# Patient Record
Sex: Female | Born: 1980 | Race: White | Hispanic: No | Marital: Married | Smoking: Never smoker
Health system: Southern US, Community
[De-identification: ages and names within clinical notes are randomized; demographics above are authoritative.]

## PROBLEM LIST (undated history)

## (undated) DIAGNOSIS — N390 Urinary tract infection, site not specified: Secondary | ICD-10-CM

## (undated) DIAGNOSIS — K589 Irritable bowel syndrome without diarrhea: Secondary | ICD-10-CM

## (undated) DIAGNOSIS — F419 Anxiety disorder, unspecified: Secondary | ICD-10-CM

## (undated) DIAGNOSIS — O039 Complete or unspecified spontaneous abortion without complication: Secondary | ICD-10-CM

## (undated) DIAGNOSIS — N200 Calculus of kidney: Secondary | ICD-10-CM

## (undated) DIAGNOSIS — F329 Major depressive disorder, single episode, unspecified: Secondary | ICD-10-CM

## (undated) DIAGNOSIS — U071 COVID-19: Secondary | ICD-10-CM

## (undated) DIAGNOSIS — F32A Depression, unspecified: Secondary | ICD-10-CM

## (undated) DIAGNOSIS — L409 Psoriasis, unspecified: Secondary | ICD-10-CM

## (undated) DIAGNOSIS — L811 Chloasma: Secondary | ICD-10-CM

## (undated) HISTORY — DX: Major depressive disorder, single episode, unspecified: F32.9

## (undated) HISTORY — DX: Irritable bowel syndrome, unspecified: K58.9

## (undated) HISTORY — PX: CERVICAL BIOPSY: SHX590

## (undated) HISTORY — DX: COVID-19: U07.1

## (undated) HISTORY — DX: Urinary tract infection, site not specified: N39.0

## (undated) HISTORY — DX: Complete or unspecified spontaneous abortion without complication: O03.9

## (undated) HISTORY — DX: Psoriasis, unspecified: L40.9

## (undated) HISTORY — DX: Anxiety disorder, unspecified: F41.9

## (undated) HISTORY — DX: Chloasma: L81.1

## (undated) HISTORY — PX: KNEE SURGERY: SHX244

## (undated) HISTORY — DX: Calculus of kidney: N20.0

## (undated) HISTORY — DX: Depression, unspecified: F32.A

---

## 2004-07-01 ENCOUNTER — Emergency Department: Payer: Self-pay | Admitting: Internal Medicine

## 2011-03-24 ENCOUNTER — Ambulatory Visit (INDEPENDENT_AMBULATORY_CARE_PROVIDER_SITE_OTHER): Payer: Self-pay | Admitting: Internal Medicine

## 2011-03-24 ENCOUNTER — Encounter: Payer: Self-pay | Admitting: Internal Medicine

## 2011-03-24 VITALS — BP 118/70 | HR 93 | Temp 98.7°F | Resp 20 | Ht 63.0 in | Wt 164.0 lb

## 2011-03-24 DIAGNOSIS — F419 Anxiety disorder, unspecified: Secondary | ICD-10-CM

## 2011-03-24 DIAGNOSIS — F411 Generalized anxiety disorder: Secondary | ICD-10-CM

## 2011-03-24 DIAGNOSIS — L409 Psoriasis, unspecified: Secondary | ICD-10-CM

## 2011-03-24 DIAGNOSIS — Z0000 Encounter for general adult medical examination without abnormal findings: Secondary | ICD-10-CM

## 2011-03-24 DIAGNOSIS — L408 Other psoriasis: Secondary | ICD-10-CM

## 2011-03-24 DIAGNOSIS — E663 Overweight: Secondary | ICD-10-CM

## 2011-03-24 MED ORDER — CITALOPRAM HYDROBROMIDE 10 MG PO TABS: 10.0000 mg | ORAL_TABLET | Freq: Every day | ORAL | Status: DC

## 2011-03-24 MED ORDER — PHENTERMINE HCL 37.5 MG PO TABS: 37.5000 mg | ORAL_TABLET | Freq: Every day | ORAL | Status: DC

## 2011-03-24 MED ORDER — PRENATAL RX 60-1 MG PO TABS: 1.0000 | ORAL_TABLET | Freq: Every day | ORAL | Status: AC

## 2011-03-24 MED ORDER — BETAMETHASONE VALERATE 0.12 % EX FOAM: 1.0000 g | Freq: Two times a day (BID) | CUTANEOUS | Status: AC

## 2011-03-24 NOTE — Patient Instructions (Signed)
Websites to look into.  [email or web address], [email or web address]

## 2011-03-25 DIAGNOSIS — F419 Anxiety disorder, unspecified: Secondary | ICD-10-CM | POA: Insufficient documentation

## 2011-03-25 DIAGNOSIS — F32A Depression, unspecified: Secondary | ICD-10-CM | POA: Insufficient documentation

## 2011-03-25 DIAGNOSIS — L409 Psoriasis, unspecified: Secondary | ICD-10-CM | POA: Insufficient documentation

## 2011-03-25 NOTE — Progress Notes (Signed)
Subjective:    Patient ID: Terri Andrews, female    DOB: 1980/09/27, 30 y.o.   MRN: [id number]  HPI 30 year old female presents to establish care. Her primary concern today is recent increase in anxiety. She notes that this first started with a miscarriage she had last year. 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 She reports that she is often short tempered and finds herself lashing out at people. She reports that this is not consistent with her typical self. She denies any suicidal ideation. She has not taken medication for depression in the past.  She also notes recent difficulty losing weight after her miscarriage. She has been monitoring her diet and trying to limit caloric intake and increase her physical activity with no improvement in her weight. She notes that her weight is up approximately 20 pounds. She denies any other symptoms such as fatigue, constipation, hot or cold intolerance. She is interested in trying an appetite suppressant.  Patient also notes a long history of psoriasis. She reports over the last several months her symptoms of rash and scaly skin and itching have been worsen. She attributes this to increase in stress. She has been using topical betamethasone cream with some improvement.  Outpatient Encounter Prescriptions as of 03/24/2011  Medication Sig Dispense Refill  . Betamethasone Valerate 0.12 % foam Apply 1 g topically 2 (two) times daily.  100 g  0  . citalopram (CELEXA) 10 MG tablet Take 1 tablet (10 mg total) by mouth daily.  30 tablet  2  . phentermine (ADIPEX-P) 37.5 MG tablet Take 1 tablet (37.5 mg total) by mouth daily before breakfast.  30 tablet  2  . Prenatal Vit-Fe Fumarate-FA (PRENATAL MULTIVITAMIN) 60-1 MG tablet Take 1 tablet by mouth daily.  30 tablet  11   Review of Systems  Constitutional: Negative for fever, chills, appetite change, fatigue and unexpected weight  change.  HENT: Negative for ear pain, congestion, sore throat, trouble swallowing, neck pain, voice change and sinus pressure.   Eyes: Negative for visual disturbance.  Respiratory: Negative for cough, shortness of breath, wheezing and stridor.   Cardiovascular: Negative for chest pain, palpitations and leg swelling.  Gastrointestinal: Negative for nausea, vomiting, abdominal pain, diarrhea, constipation, blood in stool, abdominal distention and anal bleeding.  Genitourinary: Negative for dysuria and flank pain.  Musculoskeletal: Negative for myalgias, arthralgias and gait problem.  Skin: Negative for color change and rash.  Neurological: Negative for dizziness and headaches.  Hematological: Negative for adenopathy. Does not bruise/bleed easily.  Psychiatric/Behavioral: Negative for suicidal ideas, sleep disturbance and dysphoric mood. The patient is nervous/anxious.    BP 118/70  Pulse 93  Temp(Src) 98.7 F (37.1 C) (Oral)  Resp 20  Ht 5\' 3"  (1.6 m)  Wt 164 lb (74.39 kg)  BMI 29.05 kg/m2  SpO2 97%  LMP 03/08/2011     Objective:   Physical Exam  Constitutional: She is oriented to person, place, and time. She appears well-developed and well-nourished. No distress.  HENT:  Head: Normocephalic and atraumatic.  Right Ear: External ear normal.  Left Ear: External ear normal.  Nose: Nose normal.  Mouth/Throat: Oropharynx is clear and moist. No oropharyngeal exudate.  Eyes: Conjunctivae are normal. Pupils are equal, round, and reactive to light. Right eye exhibits no discharge. Left eye exhibits no discharge. No scleral icterus.  Neck: Normal range of motion. Neck supple. No tracheal deviation present. No thyromegaly present.  Cardiovascular: Normal rate, regular rhythm, normal heart sounds and intact distal pulses.  Exam reveals no gallop and no friction rub.   No murmur heard. Pulmonary/Chest: Effort normal and breath sounds normal. No respiratory distress. She has no wheezes. She  has no rales. She exhibits no tenderness.  Musculoskeletal: Normal range of motion. She exhibits no edema and no tenderness.  Lymphadenopathy:    She has no cervical adenopathy.  Neurological: She is alert and oriented to person, place, and time. No cranial nerve deficit. She exhibits normal muscle tone. Coordination normal.  Skin: Skin is warm and dry. Rash noted. Rash is papular (scaling, over scalp c/w psoriasis). She is not diaphoretic. No erythema. No pallor.  Psychiatric: Her behavior is normal. Judgment and thought content normal. Her mood appears anxious.          Assessment & Plan:  1. Anxiety -encouraged her to take time for herself and to take time for exercise which can help with anxiety. Will start Celexa. We discussed potential side effects of this medication. She will call if she has any questions or concerns. She will return to clinic in one month.  2. Overweight - BMI 29. Patient is starting making efforts at diet and exercise in an effort to lose weight. We'll add phentermine for one to 3 month to help with appetite suppression. Patient will call if she has any questions or concerns about this medication. We discussed that this medication can have side effect of raising blood pressure, causing palpitations, or even increasing anxiety. She will followup in one month. Goal weight loss would be 1-2 pounds per week.  3. Psoriasis -will get records on previous evaluation and treatment. We'll continue topical steroid as needed.  4. Health Maintenance -will get records on previous Pap smear. Given that patient is not using contraception at this time we'll start her on a prenatal vitamin.

## 2011-04-23 ENCOUNTER — Encounter: Payer: Self-pay | Admitting: Internal Medicine

## 2011-04-23 ENCOUNTER — Ambulatory Visit (INDEPENDENT_AMBULATORY_CARE_PROVIDER_SITE_OTHER): Payer: BC Managed Care – PPO | Admitting: Internal Medicine

## 2011-04-23 ENCOUNTER — Other Ambulatory Visit (HOSPITAL_COMMUNITY)
Admission: RE | Admit: 2011-04-23 | Discharge: 2011-04-23 | Disposition: A | Discharge status: Home / Self Care | Payer: BC Managed Care – PPO | Source: Ambulatory Visit | Attending: Internal Medicine | Admitting: Internal Medicine

## 2011-04-23 VITALS — BP 100/62 | HR 89 | Temp 98.1°F | Ht 61.0 in | Wt 151.0 lb

## 2011-04-23 DIAGNOSIS — Z1159 Encounter for screening for other viral diseases: Secondary | ICD-10-CM | POA: Insufficient documentation

## 2011-04-23 DIAGNOSIS — Z0000 Encounter for general adult medical examination without abnormal findings: Secondary | ICD-10-CM

## 2011-04-23 DIAGNOSIS — Z124 Encounter for screening for malignant neoplasm of cervix: Secondary | ICD-10-CM

## 2011-04-23 DIAGNOSIS — F411 Generalized anxiety disorder: Secondary | ICD-10-CM

## 2011-04-23 DIAGNOSIS — E669 Obesity, unspecified: Secondary | ICD-10-CM | POA: Insufficient documentation

## 2011-04-23 DIAGNOSIS — F419 Anxiety disorder, unspecified: Secondary | ICD-10-CM

## 2011-04-23 DIAGNOSIS — Z01419 Encounter for gynecological examination (general) (routine) without abnormal findings: Secondary | ICD-10-CM | POA: Insufficient documentation

## 2011-04-23 DIAGNOSIS — E663 Overweight: Secondary | ICD-10-CM

## 2011-04-23 NOTE — Progress Notes (Signed)
Subjective:    Patient ID: Terri Andrews, female    DOB: December 21, 1980, 30 y.o.   MRN: [id number]  HPI 30YO female presents for her annual exam. Reports she is doing well. Reports her appetite is much lower on Phentermine.  No side effects such as insomnia, agitation, palpitations. Has been limiting po intake. Has not been exercising.    Also notes improvement in anxiety using Celexa.  Denies any side effects from this medication.  No new complaints today.  Outpatient Encounter Prescriptions as of 04/23/2011  Medication Sig Dispense Refill  . Betamethasone Valerate 0.12 % foam Apply 1 g topically 2 (two) times daily.  100 g  0  . citalopram (CELEXA) 10 MG tablet Take 1 tablet (10 mg total) by mouth daily.  30 tablet  2  . phentermine (ADIPEX-P) 37.5 MG tablet Take 1 tablet (37.5 mg total) by mouth daily before breakfast.  30 tablet  2  . Prenatal Vit-Fe Fumarate-FA (PRENATAL MULTIVITAMIN) 60-1 MG tablet Take 1 tablet by mouth daily.  30 tablet  11    Review of Systems  Constitutional: Negative for fever, chills, appetite change, fatigue and unexpected weight change.  HENT: Negative for ear pain, congestion, sore throat, trouble swallowing, neck pain, voice change and sinus pressure.   Eyes: Negative for visual disturbance.  Respiratory: Negative for cough, shortness of breath, wheezing and stridor.   Cardiovascular: Negative for chest pain, palpitations and leg swelling.  Gastrointestinal: Negative for nausea, vomiting, abdominal pain, diarrhea, constipation, blood in stool, abdominal distention and anal bleeding.  Genitourinary: Negative for dysuria and flank pain.  Musculoskeletal: Negative for myalgias, arthralgias and gait problem.  Skin: Negative for color change and rash.  Neurological: Negative for dizziness and headaches.  Hematological: Negative for adenopathy. Does not bruise/bleed easily.  Psychiatric/Behavioral: Negative for suicidal ideas, sleep disturbance and dysphoric  mood. The patient is not nervous/anxious.    BP 100/62  Pulse 89  Temp(Src) 98.1 F (36.7 C) (Oral)  Ht 5\' 1"  (1.549 m)  Wt 151 lb (68.493 kg)  BMI 28.53 kg/m2  SpO2 98%  LMP 03/29/2011     Objective:   Physical Exam  Constitutional: She is oriented to person, place, and time. She appears well-developed and well-nourished. No distress.  HENT:  Head: Normocephalic and atraumatic.  Right Ear: External ear normal.  Left Ear: External ear normal.  Nose: Nose normal.  Mouth/Throat: Oropharynx is clear and moist. No oropharyngeal exudate.  Eyes: Conjunctivae are normal. Pupils are equal, round, and reactive to light. Right eye exhibits no discharge. Left eye exhibits no discharge. No scleral icterus.  Neck: Normal range of motion. Neck supple. No tracheal deviation present. No thyromegaly present.  Cardiovascular: Normal rate, regular rhythm, normal heart sounds and intact distal pulses.  Exam reveals no gallop and no friction rub.   No murmur heard. Pulmonary/Chest: Effort normal and breath sounds normal. No respiratory distress. She has no wheezes. She has no rales. She exhibits no tenderness.  Abdominal: Soft. Bowel sounds are normal. She exhibits no distension and no mass. There is no tenderness. There is no rebound and no guarding.  Genitourinary: Rectum normal, vagina normal and uterus normal. No breast swelling, tenderness, discharge or bleeding. Pelvic exam was performed with patient prone. There is no rash, tenderness or lesion on the right labia. There is no rash, tenderness or lesion on the left labia. Uterus is not enlarged and not tender. Cervix exhibits no motion tenderness, no discharge and no friability. Right adnexum displays no  mass, no tenderness and no fullness. Left adnexum displays no mass, no tenderness and no fullness. No erythema or tenderness around the vagina. No vaginal discharge found.  Musculoskeletal: Normal range of motion. She exhibits no edema and no  tenderness.  Lymphadenopathy:    She has no cervical adenopathy.  Neurological: She is alert and oriented to person, place, and time. No cranial nerve deficit. She exhibits normal muscle tone. Coordination normal.  Skin: Skin is warm and dry. Rash noted. Rash is papular (scaling over chest, trunk, scalp). She is not diaphoretic. No erythema. No pallor.  Psychiatric: She has a normal mood and affect. Her behavior is normal. Judgment and thought content normal.          Assessment & Plan:  1. General exam - Exam including breast and pelvic exam normal today. PAP pending. Pt is UTD except for labwork. She will schedule CBC, CMP, lipids.  2. Overweight - Congratulated her on 10lb weight loss.  Will continue phentermine for another month.  Goal weight loss 1-2 lbs per week. Encouraged her to start exercising by walking daily. Follow up 1 month.  3. Anxiety - Improved with Celexa. Will continue.

## 2011-04-23 NOTE — Patient Instructions (Signed)
You are doing very well! Continue with healthy diet and start adding in exercise such as walking.  Follow up in 1 month.

## 2011-05-11 ENCOUNTER — Telehealth: Payer: Self-pay | Admitting: Internal Medicine

## 2011-05-11 ENCOUNTER — Emergency Department: Payer: Self-pay | Admitting: Emergency Medicine

## 2011-05-11 NOTE — Telephone Encounter (Signed)
[phone] Pt called stated she is having pain on left side of groin area, pt would like to talk to a nurse about this

## 2011-05-11 NOTE — Telephone Encounter (Signed)
Called patient, she is at the ER at [facility] now.

## 2011-05-20 ENCOUNTER — Telehealth: Payer: Self-pay | Admitting: Internal Medicine

## 2011-05-20 ENCOUNTER — Emergency Department: Payer: Self-pay | Admitting: Emergency Medicine

## 2011-05-20 ENCOUNTER — Ambulatory Visit: Payer: BC Managed Care – PPO | Admitting: Internal Medicine

## 2011-05-20 DIAGNOSIS — Z0289 Encounter for other administrative examinations: Secondary | ICD-10-CM

## 2011-05-20 DIAGNOSIS — N200 Calculus of kidney: Secondary | ICD-10-CM

## 2011-05-20 NOTE — Telephone Encounter (Signed)
Left mess to call office back.   

## 2011-05-21 NOTE — Telephone Encounter (Signed)
patient called to return your call. I gave her the appointment information with Dr. Artis Flock.   By Lysbeth Galas

## 2011-05-21 NOTE — Telephone Encounter (Signed)
Left mess to call office back.  Rec mess from mother that pt went back to ER and she is requesting referral to urology asap. Referral entered

## 2011-06-02 ENCOUNTER — Ambulatory Visit: Payer: BC Managed Care – PPO | Admitting: Internal Medicine

## 2011-07-12 ENCOUNTER — Telehealth: Payer: Self-pay | Admitting: Internal Medicine

## 2011-07-12 DIAGNOSIS — F419 Anxiety disorder, unspecified: Secondary | ICD-10-CM

## 2011-07-12 MED ORDER — CITALOPRAM HYDROBROMIDE 10 MG PO TABS: 10.0000 mg | ORAL_TABLET | Freq: Every day | ORAL | Status: DC

## 2011-07-12 NOTE — Telephone Encounter (Signed)
Pt thinks she has uti, offered apt today for u/a, pt declined and rescheduled for tomorrow PM. RF sent in

## 2011-07-13 ENCOUNTER — Ambulatory Visit (INDEPENDENT_AMBULATORY_CARE_PROVIDER_SITE_OTHER): Payer: BC Managed Care – PPO | Admitting: Internal Medicine

## 2011-07-13 VITALS — BP 100/70 | Temp 97.9°F | Wt 149.0 lb

## 2011-07-13 DIAGNOSIS — R52 Pain, unspecified: Secondary | ICD-10-CM

## 2011-07-13 DIAGNOSIS — N390 Urinary tract infection, site not specified: Secondary | ICD-10-CM | POA: Insufficient documentation

## 2011-07-13 DIAGNOSIS — R300 Dysuria: Secondary | ICD-10-CM

## 2011-07-13 LAB — POCT URINALYSIS DIPSTICK
Ketones, UA: NEGATIVE
Spec Grav, UA: 1.02
Urobilinogen, UA: NEGATIVE

## 2011-07-13 MED ORDER — PHENAZOPYRIDINE HCL 200 MG PO TABS: 200.0000 mg | ORAL_TABLET | Freq: Three times a day (TID) | ORAL | Status: AC | PRN

## 2011-07-13 MED ORDER — CIPROFLOXACIN HCL 250 MG PO TABS: 250.0000 mg | ORAL_TABLET | Freq: Two times a day (BID) | ORAL | Status: AC

## 2011-07-13 NOTE — Progress Notes (Signed)
  Subjective:    Patient ID: Terri Andrews, female    DOB: 1980-10-19, 31 y.o.   MRN: [id number]  HPI  31 yr old presents with 2 days of dysuria, no back pain or nausea or fevers.     Review of Systems     Objective:   Physical Exam        Assessment & Plan:

## 2011-07-13 NOTE — Assessment & Plan Note (Signed)
Ciprofloxacin prescribed for 3 days

## 2011-07-16 LAB — CULTURE, URINE COMPREHENSIVE: Colony Count: >=100000

## 2011-07-28 ENCOUNTER — Ambulatory Visit: Payer: BC Managed Care – PPO | Admitting: Internal Medicine

## 2011-07-28 DIAGNOSIS — Z0289 Encounter for other administrative examinations: Secondary | ICD-10-CM

## 2011-08-26 ENCOUNTER — Ambulatory Visit (INDEPENDENT_AMBULATORY_CARE_PROVIDER_SITE_OTHER): Payer: BC Managed Care – PPO | Admitting: Internal Medicine

## 2011-08-26 ENCOUNTER — Encounter: Payer: Self-pay | Admitting: Internal Medicine

## 2011-08-26 VITALS — BP 110/70 | HR 135 | Temp 98.5°F | Wt 142.0 lb

## 2011-08-26 DIAGNOSIS — R8290 Unspecified abnormal findings in urine: Secondary | ICD-10-CM

## 2011-08-26 DIAGNOSIS — R000 Tachycardia, unspecified: Secondary | ICD-10-CM

## 2011-08-26 DIAGNOSIS — M545 Low back pain: Secondary | ICD-10-CM

## 2011-08-26 DIAGNOSIS — F411 Generalized anxiety disorder: Secondary | ICD-10-CM

## 2011-08-26 DIAGNOSIS — N390 Urinary tract infection, site not specified: Secondary | ICD-10-CM

## 2011-08-26 DIAGNOSIS — R82998 Other abnormal findings in urine: Secondary | ICD-10-CM

## 2011-08-26 DIAGNOSIS — F419 Anxiety disorder, unspecified: Secondary | ICD-10-CM

## 2011-08-26 DIAGNOSIS — E663 Overweight: Secondary | ICD-10-CM

## 2011-08-26 DIAGNOSIS — Z7189 Other specified counseling: Secondary | ICD-10-CM | POA: Insufficient documentation

## 2011-08-26 LAB — POCT URINALYSIS DIPSTICK
Glucose, UA: NEGATIVE
Nitrite, UA: NEGATIVE
Urobilinogen, UA: 0.2

## 2011-08-26 MED ORDER — CIPROFLOXACIN HCL 250 MG PO TABS: 250.0000 mg | ORAL_TABLET | Freq: Two times a day (BID) | ORAL | Status: AC

## 2011-08-26 MED ORDER — FOLIC ACID 1 MG PO TABS: 1.0000 mg | ORAL_TABLET | Freq: Every day | ORAL | Status: AC

## 2011-08-26 NOTE — Progress Notes (Signed)
Subjective:    Patient ID: Terri Andrews, female    DOB: [birth year], 31 y.o.   MRN: [id number]  HPI 30 year old female with history of kidney stone,depression/anxiety, difficulty losing weight presents for followup. She notes that she is currently trying to get pregnant. She notes some mild low back pain which she attributes to possible ovulation versus early pregnancy. She also notes some decreased appetite. She denies any nausea or vomiting. She has noted a foul smell to her urine. She denies any dysuria, frequency, or flank pain. She denies any fever or chills.  In regards to her weight loss, she reports that she has been compliant with phentermine. She has been following a healthy diet and has been exercising on a regular basis. She has lost another 7 pounds over the last 2 months.  Outpatient Encounter Prescriptions as of 08/26/2011  Medication Sig Dispense Refill  . Betamethasone Valerate 0.12 % foam Apply 1 g topically 2 (two) times daily.  100 g  0  . Prenatal Vit-Fe Fumarate-FA (PRENATAL MULTIVITAMIN) 60-1 MG tablet Take 1 tablet by mouth daily.  30 tablet  11  . DISCONTD: citalopram (CELEXA) 10 MG tablet Take 1 tablet (10 mg total) by mouth daily.  90 tablet  1  . DISCONTD: phentermine (ADIPEX-P) 37.5 MG tablet Take 37.5 mg by mouth daily before breakfast.      . ciprofloxacin (CIPRO) 250 MG tablet Take 1 tablet (250 mg total) by mouth 2 (two) times daily.  6 tablet  0  . folic acid (FOLVITE) 1 MG tablet Take 1 tablet (1 mg total) by mouth daily.  90 tablet  3    Review of Systems  Constitutional: Negative for fever, chills, appetite change, fatigue and unexpected weight change.  HENT: Negative for ear pain, congestion, sore throat, trouble swallowing, neck pain, voice change and sinus pressure.   Eyes: Negative for visual disturbance.  Respiratory: Negative for cough, shortness of breath, wheezing and stridor.   Cardiovascular: Negative for chest pain, palpitations and leg  swelling.  Gastrointestinal: Negative for nausea, vomiting, abdominal pain, diarrhea, constipation, blood in stool, abdominal distention and anal bleeding.  Genitourinary: Negative for dysuria, urgency, frequency, hematuria, flank pain, vaginal bleeding, vaginal discharge and vaginal pain.  Musculoskeletal: Positive for back pain. Negative for myalgias, arthralgias and gait problem.  Skin: Negative for color change and rash.  Neurological: Negative for dizziness and headaches.  Hematological: Negative for adenopathy. Does not bruise/bleed easily.  Psychiatric/Behavioral: Negative for suicidal ideas, sleep disturbance and dysphoric mood. The patient is not nervous/anxious.    BP 110/70  Pulse 135  Temp(Src) 98.5 F (36.9 C) (Oral)  Wt 142 lb (64.411 kg)  SpO2 100%  LMP 08/06/2011     Objective:   Physical Exam  Constitutional: She is oriented to person, place, and time. She appears well-developed and well-nourished. No distress.  HENT:  Head: Normocephalic and atraumatic.  Right Ear: External ear normal.  Left Ear: External ear normal.  Nose: Nose normal.  Mouth/Throat: Oropharynx is clear and moist. No oropharyngeal exudate.  Eyes: Conjunctivae are normal. Pupils are equal, round, and reactive to light. Right eye exhibits no discharge. Left eye exhibits no discharge. No scleral icterus.  Neck: Normal range of motion. Neck supple. No tracheal deviation present. No thyromegaly present.  Cardiovascular: Regular rhythm, normal heart sounds and intact distal pulses.  Tachycardia present.  Exam reveals no gallop and no friction rub.   No murmur heard. Pulmonary/Chest: Effort normal and breath sounds normal. No respiratory distress.  She has no wheezes. She has no rales. She exhibits no tenderness.  Musculoskeletal: Normal range of motion. She exhibits no edema and no tenderness.  Lymphadenopathy:    She has no cervical adenopathy.  Neurological: She is alert and oriented to person, place,  and time. No cranial nerve deficit. She exhibits normal muscle tone. Coordination normal.  Skin: Skin is warm and dry. No rash noted. She is not diaphoretic. No erythema. No pallor.  Psychiatric: She has a normal mood and affect. Her behavior is normal. Judgment and thought content normal.          Assessment & Plan:

## 2011-08-26 NOTE — Assessment & Plan Note (Signed)
Likely secondary to dehydration and use of phentermine.. Phentermine as above. Encouraged patient to increase fluid intake especially with her vigorous exercise routine.

## 2011-08-26 NOTE — Assessment & Plan Note (Signed)
Symptoms are improved on Celexa, however patient is actively trying to get pregnant. She has started tapering her medication to every other day. Encouraged her to continue with this with the plan to stop Celexa after one week.

## 2011-08-26 NOTE — Assessment & Plan Note (Signed)
We discussed that is no longer safe her to use phentermine while trying to get pregnant. She will discontinue this medication. She will continue to follow a healthy diet and continue with her exercise routine an effort to maintain weight.

## 2011-08-26 NOTE — Assessment & Plan Note (Signed)
Patient noted to have blood in her urine today and notes foul smell to urine over the last few days. Will send urine for culture. Will treat with Cipro x3 days. If no evidence of infection on culture, will plan to evaluate further for recurrent kidney stone. However, no pain or other symptoms to suggest this at this time.

## 2011-08-26 NOTE — Assessment & Plan Note (Signed)
Urine pregnancy test is negative today. Will send serum hCG to look for early pregnancy. As above, we discussed the risk of using both Celexa and phentermine while trying to conceive. She will stop both of these medications. We also discussed the importance of taking folic acid. Prescription called in today. She has been unable to tolerate prenatal vitamins because of constipation associated with iron. Given that she has been having unprotected sex for 2 years, we also discussed potential of obstruction of her fallopian tubes. Notably, she has a history of miscarriage in the past. She will followup with her OB/GYN to discuss HSG.

## 2011-08-27 LAB — HCG, SERUM, QUALITATIVE: Preg, Serum: NEGATIVE

## 2011-08-27 LAB — TSH: TSH: 1.34 u[IU]/mL (ref 0.35–5.50)

## 2011-08-27 LAB — COMPREHENSIVE METABOLIC PANEL
ALT: 11 U/L (ref 0–35)
CO2: 23 mEq/L (ref 19–32)
Creatinine, Ser: 0.7 mg/dL (ref 0.4–1.2)
GFR: 100.42 mL/min (ref >60.00)
Glucose, Bld: 81 mg/dL (ref 70–99)
Total Bilirubin: 0.7 mg/dL (ref 0.3–1.2)

## 2011-08-28 LAB — URINE CULTURE
Colony Count: NO GROWTH
Organism ID, Bacteria: NO GROWTH

## 2011-09-20 ENCOUNTER — Telehealth: Payer: Self-pay | Admitting: Internal Medicine

## 2011-09-20 NOTE — Telephone Encounter (Signed)
We don't have any openings today. Can try to work her in later this week or with Dr. Darrick Huntsman?

## 2011-09-20 NOTE — Telephone Encounter (Signed)
I called to schedule an appointment for this patient and she informed me she went to see another physician.

## 2011-09-20 NOTE — Telephone Encounter (Signed)
Patient has a sinus infection wants to be seen today.

## 2011-12-01 ENCOUNTER — Ambulatory Visit: Payer: BC Managed Care – PPO | Admitting: Internal Medicine

## 2012-02-03 ENCOUNTER — Ambulatory Visit: Payer: Self-pay | Admitting: Obstetrics and Gynecology

## 2012-04-12 DIAGNOSIS — N978 Female infertility of other origin: Secondary | ICD-10-CM | POA: Insufficient documentation

## 2012-04-12 HISTORY — DX: Female infertility of other origin: N97.8

## 2012-04-23 HISTORY — PX: UTERINE SEPTUM RESECTION: SHX5386

## 2012-07-08 ENCOUNTER — Other Ambulatory Visit: Payer: Self-pay

## 2012-08-17 ENCOUNTER — Ambulatory Visit (INDEPENDENT_AMBULATORY_CARE_PROVIDER_SITE_OTHER): Payer: BC Managed Care – PPO | Admitting: Internal Medicine

## 2012-08-17 ENCOUNTER — Other Ambulatory Visit (INDEPENDENT_AMBULATORY_CARE_PROVIDER_SITE_OTHER): Payer: BC Managed Care – PPO

## 2012-08-17 ENCOUNTER — Encounter: Payer: Self-pay | Admitting: Internal Medicine

## 2012-08-17 ENCOUNTER — Telehealth: Payer: Self-pay | Admitting: *Deleted

## 2012-08-17 ENCOUNTER — Telehealth: Payer: Self-pay | Admitting: Internal Medicine

## 2012-08-17 VITALS — BP 110/76 | HR 103 | Temp 98.6°F | Wt 170.0 lb

## 2012-08-17 DIAGNOSIS — R110 Nausea: Secondary | ICD-10-CM

## 2012-08-17 DIAGNOSIS — Z3480 Encounter for supervision of other normal pregnancy, unspecified trimester: Secondary | ICD-10-CM

## 2012-08-17 DIAGNOSIS — Z139 Encounter for screening, unspecified: Secondary | ICD-10-CM

## 2012-08-17 LAB — POCT URINE PREGNANCY: Preg Test, Ur: POSITIVE

## 2012-08-17 MED ORDER — ONDANSETRON HCL 8 MG PO TABS: 8.0000 mg | ORAL_TABLET | Freq: Three times a day (TID) | ORAL | Status: DC | PRN

## 2012-08-17 NOTE — Telephone Encounter (Signed)
I would just do a urine pregnancy test.

## 2012-08-17 NOTE — Assessment & Plan Note (Addendum)
Urine pregnancy test positive. Basic prenatal counseling performed today including discussion of appropriate food intake, avoidance of medications such as nonsteroidals. We also discussed avoidance of alcohol and limitation of caffeine. Continue PNV. Given that she had extensive morning sickness during her last pregnancy, will write for Zofran to use as needed. She will followup with reproductive endocrinology as scheduled.

## 2012-08-17 NOTE — Telephone Encounter (Signed)
Pt is here for labs, is it just for a urine preg. Test or for a urine and blood preg. Test?

## 2012-08-17 NOTE — Telephone Encounter (Signed)
She can stop by for a urine pregnancy test.

## 2012-08-17 NOTE — Telephone Encounter (Signed)
Patient is wanting a pregnancy test done. Could you put in an order or does she need to be seen in the office?

## 2012-08-17 NOTE — Telephone Encounter (Signed)
Left message on patient voicemail telling it was ok for her to stop by in regards to the question she had called about this morning.

## 2012-08-17 NOTE — Telephone Encounter (Signed)
Fwd to Dr. Walker 

## 2012-08-17 NOTE — Progress Notes (Signed)
  Subjective:    Patient ID: Terri Andrews, female    DOB: Mar 14, 1981, 32 y.o.   MRN: [id number]  HPI 33 year old female presents for acute visit noting positive pregnancy test. She was recently seen at [facility] reproductive endocrinology and had surgical resection of a uterine septum. She was started on Clomid to help with ovulation last month. She did not have a period this month and took pregnancy test at home x2 which was positive. She denies any abdominal pain, nausea. She reports she is generally feeling well. She was told by her reproductive endocrinologist that she needs to have serum hCG checked.  Outpatient Encounter Prescriptions as of 08/17/2012  Medication Sig Dispense Refill  . folic acid (FOLVITE) 1 MG tablet Take 1 tablet (1 mg total) by mouth daily.  90 tablet  3  . Prenatal Vit-Fe Fumarate-FA (MULTIVITAMIN-PRENATAL) 27-0.8 MG TABS Take 1 tablet by mouth daily at 12 noon.      . ondansetron (ZOFRAN) 8 MG tablet Take 1 tablet (8 mg total) by mouth every 8 (eight) hours as needed for nausea.  60 tablet  1   No facility-administered encounter medications on file as of 08/17/2012.   BP 110/76  Pulse 103  Temp(Src) 98.6 F (37 C) (Oral)  Wt 170 lb (77.111 kg)  BMI 32.14 kg/m2  SpO2 99%  Review of Systems  Constitutional: Negative for fever, chills, appetite change, fatigue and unexpected weight change.  HENT: Negative for ear pain, congestion, sore throat, trouble swallowing, neck pain, voice change and sinus pressure.   Eyes: Negative for visual disturbance.  Respiratory: Negative for cough, shortness of breath, wheezing and stridor.   Cardiovascular: Negative for chest pain, palpitations and leg swelling.  Gastrointestinal: Negative for nausea, vomiting, abdominal pain, diarrhea, constipation, blood in stool, abdominal distention and anal bleeding.  Genitourinary: Negative for dysuria and flank pain.  Musculoskeletal: Negative for myalgias, arthralgias and gait  problem.  Skin: Negative for color change and rash.  Neurological: Negative for dizziness and headaches.  Hematological: Negative for adenopathy. Does not bruise/bleed easily.  Psychiatric/Behavioral: Negative for suicidal ideas, sleep disturbance and dysphoric mood. The patient is not nervous/anxious.        Objective:   Physical Exam  Constitutional: She is oriented to person, place, and time. She appears well-developed and well-nourished. No distress.  HENT:  Head: Normocephalic.  Eyes: Pupils are equal, round, and reactive to light.  Neck: Normal range of motion.  Pulmonary/Chest: Effort normal.  Neurological: She is alert and oriented to person, place, and time. She has normal reflexes.  Skin: Skin is warm and dry. She is not diaphoretic.  Psychiatric: She has a normal mood and affect. Her behavior is normal. Judgment and thought content normal.          Assessment & Plan:

## 2012-08-17 NOTE — Patient Instructions (Signed)
[facility]man Riis - [facility]

## 2012-08-18 ENCOUNTER — Ambulatory Visit: Payer: BC Managed Care – PPO | Admitting: Internal Medicine

## 2012-08-18 ENCOUNTER — Telehealth: Payer: Self-pay | Admitting: Internal Medicine

## 2012-08-18 NOTE — Telephone Encounter (Signed)
Pt states lab results from yesterday were to be sent to her fertility specialist fax [phone] [facility] Dr. Samuella Cota.

## 2012-08-18 NOTE — Telephone Encounter (Signed)
Faxed labs

## 2012-08-18 NOTE — Telephone Encounter (Signed)
HCG  Quant (has to be quantitative not qualitiative), pregnancy, subsequent

## 2012-08-18 NOTE — Telephone Encounter (Signed)
Pt is coming in for labs Monday 03.31.2014 what labs and dx would you like for this pt?

## 2012-08-21 ENCOUNTER — Other Ambulatory Visit: Payer: Self-pay | Admitting: Internal Medicine

## 2012-08-21 ENCOUNTER — Telehealth: Payer: Self-pay | Admitting: *Deleted

## 2012-08-21 ENCOUNTER — Other Ambulatory Visit (INDEPENDENT_AMBULATORY_CARE_PROVIDER_SITE_OTHER): Payer: BC Managed Care – PPO

## 2012-08-21 DIAGNOSIS — Z139 Encounter for screening, unspecified: Secondary | ICD-10-CM

## 2012-08-21 NOTE — Telephone Encounter (Signed)
Serum HCG QUANTITATIVE

## 2012-08-21 NOTE — Telephone Encounter (Signed)
What dx code

## 2012-08-21 NOTE — Telephone Encounter (Signed)
Pt is coming in for labs today, what labs and dx would you like?

## 2012-08-21 NOTE — Telephone Encounter (Signed)
V22.1

## 2012-08-22 ENCOUNTER — Telehealth: Payer: Self-pay | Admitting: *Deleted

## 2012-08-22 NOTE — Telephone Encounter (Signed)
Patient is requesting lab results be faxed to [facility].

## 2012-08-22 NOTE — Telephone Encounter (Signed)
Information faxed via Epic 

## 2012-11-08 ENCOUNTER — Observation Stay: Payer: Self-pay | Admitting: Obstetrics & Gynecology

## 2012-11-08 LAB — CBC WITH DIFFERENTIAL/PLATELET
Basophil #: 0.1 10*3/uL (ref 0.0–0.1)
HCT: 35.5 % (ref 35.0–47.0)
HGB: 12.4 g/dL (ref 12.0–16.0)
Lymphocyte #: 1.6 10*3/uL (ref 1.0–3.6)
Lymphocyte %: 11.5 %
MCH: 28.6 pg (ref 26.0–34.0)
MCV: 82 fL (ref 80–100)
Monocyte #: 0.6 x10 3/mm (ref 0.2–0.9)
Neutrophil #: 11.4 10*3/uL — ABNORMAL HIGH (ref 1.4–6.5)
Neutrophil %: 83.1 %
Platelet: 269 10*3/uL (ref 150–440)
RBC: 4.34 10*6/uL (ref 3.80–5.20)
WBC: 13.7 10*3/uL — ABNORMAL HIGH (ref 3.6–11.0)

## 2012-11-09 LAB — WBC: WBC: 11 10*3/uL (ref 3.6–11.0)

## 2012-11-30 ENCOUNTER — Encounter: Payer: Self-pay | Admitting: Obstetrics and Gynecology

## 2012-12-05 ENCOUNTER — Encounter: Payer: Self-pay | Admitting: Pediatrics

## 2012-12-21 ENCOUNTER — Encounter: Payer: Self-pay | Admitting: Obstetrics and Gynecology

## 2012-12-22 ENCOUNTER — Encounter: Payer: Self-pay | Admitting: Obstetrics and Gynecology

## 2013-03-29 ENCOUNTER — Other Ambulatory Visit: Payer: Self-pay

## 2013-03-29 ENCOUNTER — Encounter: Payer: Self-pay | Admitting: Obstetrics & Gynecology

## 2013-10-04 ENCOUNTER — Ambulatory Visit (INDEPENDENT_AMBULATORY_CARE_PROVIDER_SITE_OTHER): Payer: BC Managed Care – PPO | Admitting: Internal Medicine

## 2013-10-04 ENCOUNTER — Encounter: Payer: Self-pay | Admitting: Internal Medicine

## 2013-10-04 VITALS — BP 102/78 | HR 86 | Temp 97.9°F | Wt 168.8 lb

## 2013-10-04 DIAGNOSIS — R5383 Other fatigue: Secondary | ICD-10-CM | POA: Insufficient documentation

## 2013-10-04 DIAGNOSIS — L408 Other psoriasis: Secondary | ICD-10-CM

## 2013-10-04 DIAGNOSIS — E663 Overweight: Secondary | ICD-10-CM

## 2013-10-04 DIAGNOSIS — R413 Other amnesia: Secondary | ICD-10-CM | POA: Insufficient documentation

## 2013-10-04 DIAGNOSIS — R5381 Other malaise: Secondary | ICD-10-CM

## 2013-10-04 DIAGNOSIS — L409 Psoriasis, unspecified: Secondary | ICD-10-CM

## 2013-10-04 MED ORDER — PHENTERMINE HCL 37.5 MG PO TABS: 37.5000 mg | ORAL_TABLET | Freq: Every day | ORAL | Status: DC

## 2013-10-04 NOTE — Progress Notes (Signed)
Pre visit review using our clinic review tool, if applicable. No additional management support is needed unless otherwise documented below in the visit note. 

## 2013-10-04 NOTE — Progress Notes (Signed)
Subjective:    Patient ID: Terri Andrews, female    DOB: 04/13/1981, 33 y.o.   MRN: [id number]  HPI 32YO female presents for follow up. Recently had a baby. Suffered post-partum depression. Started back on Celexa with some improvement. Continues to feel fatigued. Sleep disrupted and up throughout night to care for infant. Notes some short term memory loss over last few months. Frustrated by difficulty losing weight. Would like to start back on Phentermine as this worked well for her before.  Seen today by dermatologist. Plans to start on new medication for psoriasis.  Review of Systems  Constitutional: Positive for fatigue. Negative for fever, chills, appetite change and unexpected weight change.  HENT: Negative for congestion, ear pain, sinus pressure, sore throat, trouble swallowing and voice change.   Eyes: Negative for visual disturbance.  Respiratory: Negative for cough, shortness of breath, wheezing and stridor.   Cardiovascular: Negative for chest pain, palpitations and leg swelling.  Gastrointestinal: Negative for nausea, vomiting, abdominal pain, diarrhea, constipation, blood in stool, abdominal distention and anal bleeding.  Genitourinary: Negative for dysuria and flank pain.  Musculoskeletal: Negative for arthralgias, gait problem, myalgias and neck pain.  Skin: Positive for rash. Negative for color change.  Neurological: Negative for dizziness, speech difficulty, weakness and headaches.  Hematological: Negative for adenopathy. Does not bruise/bleed easily.  Psychiatric/Behavioral: Positive for decreased concentration. Negative for suicidal ideas, sleep disturbance and dysphoric mood. The patient is not nervous/anxious.        Objective:    BP 102/78  Pulse 86  Temp(Src) 97.9 F (36.6 C) (Oral)  Wt 168 lb 12 oz (76.544 kg)  SpO2 98%  LMP 09/04/2013 Physical Exam  Constitutional: She is oriented to person, place, and time. She appears well-developed and  well-nourished. No distress.  HENT:  Head: Normocephalic and atraumatic.  Right Ear: External ear normal.  Left Ear: External ear normal.  Nose: Nose normal.  Mouth/Throat: Oropharynx is clear and moist. No oropharyngeal exudate.  Eyes: Conjunctivae are normal. Pupils are equal, round, and reactive to light. Right eye exhibits no discharge. Left eye exhibits no discharge. No scleral icterus.  Neck: Normal range of motion. Neck supple. No tracheal deviation present. No thyromegaly present.  Cardiovascular: Normal rate, regular rhythm, normal heart sounds and intact distal pulses.  Exam reveals no gallop and no friction rub.   No murmur heard. Pulmonary/Chest: Effort normal and breath sounds normal. No accessory muscle usage. Not tachypneic. No respiratory distress. She has no decreased breath sounds. She has no wheezes. She has no rhonchi. She has no rales. She exhibits no tenderness.  Musculoskeletal: Normal range of motion. She exhibits no edema and no tenderness.  Lymphadenopathy:    She has no cervical adenopathy.  Neurological: She is alert and oriented to person, place, and time. No cranial nerve deficit. She exhibits normal muscle tone. Coordination normal.  Skin: Skin is warm and dry. Rash (erythematous scaling rash over face, trunk, extremities, c/w known psoriasis) noted. She is not diaphoretic. No erythema. No pallor.  Psychiatric: She has a normal mood and affect. Her speech is normal and behavior is normal. Judgment and thought content normal. Cognition and memory are normal.          Assessment & Plan:   Problem List Items Addressed This Visit   Fatigue     Likely related to disrupted sleep from caring for new baby. However, will check CBC, TSH, CMP with labs. Follow up 4 weeks.    Relevant Orders  T4, free      Comprehensive metabolic panel      CBC with Differential      TSH      B12   Memory loss     Likely normal with disrupted sleep with new baby, however  will check TSH and B12 with labs.    Relevant Orders      T4, free      TSH      B12   Overweight - Primary     Encouraged healthy diet and exercise. Will check TSH with labs. Start phentermine to help with appetite suppression. Follow up 4 weeks.    Relevant Medications      phentermine (ADIPEX-P) 37.5 MG tablet   Psoriasis     Will check Quantiferon Gold and Hep B panel given pending use of immunosuppressive agents.    Relevant Orders      Quantiferon tb gold assay      Hepatitis B surface antigen      Hepatitis B surface antibody      Hepatitis B Core Antibody, IgM       Return in about 4 weeks (around 11/01/2013) for Recheck.

## 2013-10-04 NOTE — Assessment & Plan Note (Signed)
Likely related to disrupted sleep from caring for new baby. However, will check CBC, TSH, CMP with labs. Follow up 4 weeks.

## 2013-10-04 NOTE — Assessment & Plan Note (Signed)
Likely normal with disrupted sleep with new baby, however will check TSH and B12 with labs.

## 2013-10-04 NOTE — Patient Instructions (Signed)
We will check labs tomorrow.  Start Phentermine 37.5mg  in the morning to help with appetite.  Follow up in 4 weeks.

## 2013-10-04 NOTE — Assessment & Plan Note (Signed)
Will check Quantiferon Gold and Hep B panel given pending use of immunosuppressive agents.

## 2013-10-04 NOTE — Assessment & Plan Note (Signed)
Encouraged healthy diet and exercise. Will check TSH with labs. Start phentermine to help with appetite suppression. Follow up 4 weeks.

## 2013-10-05 ENCOUNTER — Other Ambulatory Visit (INDEPENDENT_AMBULATORY_CARE_PROVIDER_SITE_OTHER): Payer: BC Managed Care – PPO

## 2013-10-05 ENCOUNTER — Telehealth: Payer: Self-pay | Admitting: *Deleted

## 2013-10-05 DIAGNOSIS — L409 Psoriasis, unspecified: Secondary | ICD-10-CM

## 2013-10-05 DIAGNOSIS — R5383 Other fatigue: Secondary | ICD-10-CM

## 2013-10-05 DIAGNOSIS — R5381 Other malaise: Secondary | ICD-10-CM

## 2013-10-05 DIAGNOSIS — R413 Other amnesia: Secondary | ICD-10-CM

## 2013-10-05 LAB — CBC WITH DIFFERENTIAL/PLATELET
Basophils Absolute: 0 10*3/uL (ref 0.0–0.1)
Basophils Relative: 0 % (ref 0–1)
EOS PCT: 2 % (ref 0–5)
Eosinophils Absolute: 0.2 10*3/uL (ref 0.0–0.7)
HCT: 40.3 % (ref 36.0–46.0)
Hemoglobin: 13.7 g/dL (ref 12.0–15.0)
LYMPHS ABS: 2.4 10*3/uL (ref 0.7–4.0)
Lymphocytes Relative: 25 % (ref 12–46)
MCH: 27.2 pg (ref 26.0–34.0)
MCHC: 34 g/dL (ref 30.0–36.0)
MCV: 80 fL (ref 78.0–100.0)
MONO ABS: 0.6 10*3/uL (ref 0.1–1.0)
Monocytes Relative: 6 % (ref 3–12)
NEUTROS ABS: 6.3 10*3/uL (ref 1.7–7.7)
Neutrophils Relative %: 67 % (ref 43–77)
Platelets: 363 10*3/uL (ref 150–400)
RBC: 5.04 MIL/uL (ref 3.87–5.11)
RDW: 13.6 % (ref 11.5–15.5)
WBC: 9.4 10*3/uL (ref 4.0–10.5)

## 2013-10-05 NOTE — Telephone Encounter (Signed)
Tried calling pt to let her know I missed a lab order to collect the Quantiferon tb gold, to return to the lab, if you can please try calling her

## 2013-10-06 LAB — T4, FREE: Free T4: 1.21 ng/dL (ref 0.80–1.80)

## 2013-10-06 LAB — COMPREHENSIVE METABOLIC PANEL
ALBUMIN: 4.2 g/dL (ref 3.5–5.2)
ALT: 20 U/L (ref 0–35)
AST: 16 U/L (ref 0–37)
Alkaline Phosphatase: 62 U/L (ref 39–117)
BUN: 8 mg/dL (ref 6–23)
CHLORIDE: 102 meq/L (ref 96–112)
CO2: 25 mEq/L (ref 19–32)
Calcium: 9.7 mg/dL (ref 8.4–10.5)
Creat: 0.68 mg/dL (ref 0.50–1.10)
GLUCOSE: 99 mg/dL (ref 70–99)
Potassium: 4 mEq/L (ref 3.5–5.3)
Sodium: 137 mEq/L (ref 135–145)
Total Bilirubin: 0.4 mg/dL (ref 0.2–1.2)
Total Protein: 6.9 g/dL (ref 6.0–8.3)

## 2013-10-06 LAB — TSH: TSH: 1.831 u[IU]/mL (ref 0.350–4.500)

## 2013-10-06 LAB — HEPATITIS B SURFACE ANTIBODY,QUALITATIVE: Hep B S Ab: NEGATIVE

## 2013-10-06 LAB — HEPATITIS B CORE ANTIBODY, IGM: HEP B C IGM: NONREACTIVE

## 2013-10-06 LAB — VITAMIN B12: Vitamin B-12: 347 pg/mL (ref 211–911)

## 2013-10-06 LAB — HEPATITIS B SURFACE ANTIGEN: Hepatitis B Surface Ag: NEGATIVE

## 2013-11-01 ENCOUNTER — Ambulatory Visit (INDEPENDENT_AMBULATORY_CARE_PROVIDER_SITE_OTHER): Payer: BC Managed Care – PPO | Admitting: Internal Medicine

## 2013-11-01 ENCOUNTER — Encounter: Payer: Self-pay | Admitting: Internal Medicine

## 2013-11-01 VITALS — BP 110/72 | HR 91 | Temp 98.0°F | Ht 61.0 in | Wt 162.5 lb

## 2013-11-01 DIAGNOSIS — E663 Overweight: Secondary | ICD-10-CM

## 2013-11-01 DIAGNOSIS — R413 Other amnesia: Secondary | ICD-10-CM

## 2013-11-01 NOTE — Assessment & Plan Note (Signed)
Recent symptoms of difficulty focusing and concentrating. Most likely related to sleep deprivation with new baby and work full time. Recent labs normal. Will set up psychology evaluation for ADD testing.

## 2013-11-01 NOTE — Progress Notes (Signed)
Subjective:    Patient ID: Terri Andrews, female    DOB: 1981-03-10, 33 y.o.   MRN: [id number]  HPI 32YO female presents for follow up.  Overweight - Appetite improved with Phentermine. Only side effect noted is dry mouth. Has increased water intake. Eating healthy. Exercising by swimming.   Memory loss - Concerned about poor concentration. Difficulty focusing on tasks both at home and work. For example, has left piles of laundry around her home and forgot to finish. Or, at work, she forgets to follow through and complete tasks such as when a child asks her for something. No previous h/o ADHD. Continues to have impaired sleep while caring for newborn.  Review of Systems  Constitutional: Positive for fatigue. Negative for chills and appetite change.  Eyes: Negative for visual disturbance.  Respiratory: Negative for shortness of breath.   Cardiovascular: Negative for chest pain, palpitations and leg swelling.  Gastrointestinal: Negative for abdominal pain.  Genitourinary: Negative for dysuria and flank pain.  Musculoskeletal: Negative for arthralgias, gait problem, myalgias and neck pain.  Skin: Negative for color change and rash.  Neurological: Negative for dizziness and headaches.  Hematological: Negative for adenopathy. Does not bruise/bleed easily.  Psychiatric/Behavioral: Positive for behavioral problems, sleep disturbance and decreased concentration. Negative for suicidal ideas and dysphoric mood. The patient is not nervous/anxious.        Objective:    BP 110/72  Pulse 91  Temp(Src) 98 F (36.7 C) (Oral)  Ht 5\' 1"  (1.549 m)  Wt 162 lb 8 oz (73.71 kg)  BMI 30.72 kg/m2  SpO2 96%  LMP 10/04/2013 Physical Exam  Constitutional: She is oriented to person, place, and time. She appears well-developed and well-nourished. No distress.  HENT:  Head: Normocephalic and atraumatic.  Right Ear: External ear normal.  Left Ear: External ear normal.  Nose: Nose normal.    Mouth/Throat: Oropharynx is clear and moist. No oropharyngeal exudate.  Eyes: Conjunctivae are normal. Pupils are equal, round, and reactive to light. Right eye exhibits no discharge. Left eye exhibits no discharge. No scleral icterus.  Neck: Normal range of motion. Neck supple. No tracheal deviation present. No thyromegaly present.  Cardiovascular: Normal rate, regular rhythm, normal heart sounds and intact distal pulses.  Exam reveals no gallop and no friction rub.   No murmur heard. Pulmonary/Chest: Effort normal and breath sounds normal. No accessory muscle usage. Not tachypneic. No respiratory distress. She has no decreased breath sounds. She has no wheezes. She has no rhonchi. She has no rales. She exhibits no tenderness.  Musculoskeletal: Normal range of motion. She exhibits no edema and no tenderness.  Lymphadenopathy:    She has no cervical adenopathy.  Neurological: She is alert and oriented to person, place, and time. No cranial nerve deficit. She exhibits normal muscle tone. Coordination normal.  Skin: Skin is warm and dry. No rash noted. She is not diaphoretic. No erythema. No pallor.  Psychiatric: She has a normal mood and affect. Her speech is normal and behavior is normal. Judgment and thought content normal. Cognition and memory are normal.          Assessment & Plan:   Problem List Items Addressed This Visit     Unprioritized   Memory loss     Recent symptoms of difficulty focusing and concentrating. Most likely related to sleep deprivation with new baby and work full time. Recent labs normal. Will set up psychology evaluation for ADD testing.    Relevant Orders  Ambulatory referral to Psychology   Overweight - Primary      Wt Readings from Last 3 Encounters:  11/01/13 162 lb 8 oz (73.71 kg)  10/04/13 168 lb 12 oz (76.544 kg)  08/17/12 170 lb (77.111 kg)   Congratulated pt on weight loss. Encouraged continued healthy diet and exercise. Will continue  phentermine for appetite suppression. Follow up 3 months and prn.        Return in about 3 months (around 02/01/2014) for Recheck Weight.

## 2013-11-01 NOTE — Progress Notes (Signed)
Pre visit review using our clinic review tool, if applicable. No additional management support is needed unless otherwise documented below in the visit note. 

## 2013-11-01 NOTE — Assessment & Plan Note (Signed)
Wt Readings from Last 3 Encounters:  11/01/13 162 lb 8 oz (73.71 kg)  10/04/13 168 lb 12 oz (76.544 kg)  08/17/12 170 lb (77.111 kg)   Congratulated pt on weight loss. Encouraged continued healthy diet and exercise. Will continue phentermine for appetite suppression. Follow up 3 months and prn.

## 2013-11-21 ENCOUNTER — Ambulatory Visit: Payer: BC Managed Care – PPO | Admitting: Psychology

## 2013-12-05 ENCOUNTER — Ambulatory Visit: Payer: BC Managed Care – PPO | Admitting: Psychology

## 2013-12-19 ENCOUNTER — Ambulatory Visit (INDEPENDENT_AMBULATORY_CARE_PROVIDER_SITE_OTHER): Payer: BC Managed Care – PPO | Admitting: Psychology

## 2013-12-19 DIAGNOSIS — F909 Attention-deficit hyperactivity disorder, unspecified type: Secondary | ICD-10-CM

## 2013-12-19 DIAGNOSIS — F848 Other pervasive developmental disorders: Secondary | ICD-10-CM

## 2014-01-15 ENCOUNTER — Other Ambulatory Visit: Payer: Self-pay | Admitting: Internal Medicine

## 2014-01-15 DIAGNOSIS — E663 Overweight: Secondary | ICD-10-CM

## 2014-01-15 MED ORDER — PHENTERMINE HCL 37.5 MG PO TABS: 37.5000 mg | ORAL_TABLET | Freq: Every day | ORAL | Status: DC

## 2014-01-15 NOTE — Telephone Encounter (Signed)
Pt called in and was wanting to get a one month refill on her phentermine 37.5mg . Has an appt on 9/17

## 2014-01-16 NOTE — Telephone Encounter (Signed)
Rx faxed to pharmacy  

## 2014-02-07 ENCOUNTER — Ambulatory Visit: Payer: BC Managed Care – PPO | Admitting: Internal Medicine

## 2014-02-19 ENCOUNTER — Telehealth: Payer: Self-pay

## 2014-02-19 ENCOUNTER — Ambulatory Visit: Payer: BC Managed Care – PPO | Admitting: Internal Medicine

## 2014-02-19 NOTE — Telephone Encounter (Signed)
The patient called and is hoping to get a refill of her medication  Pt callback - [phone]   (apt 10/12)

## 2014-02-19 NOTE — Telephone Encounter (Signed)
Please call the patient to discuss medications.  Thanks!

## 2014-02-19 NOTE — Telephone Encounter (Signed)
Did she call her pharmacy? If she did and there is an issue - What medication and dose? What pharmacy ?

## 2014-02-20 ENCOUNTER — Other Ambulatory Visit: Payer: Self-pay | Admitting: *Deleted

## 2014-02-20 NOTE — Telephone Encounter (Signed)
No. She cancelled last appointment. Needs a follow up.

## 2014-02-20 NOTE — Telephone Encounter (Signed)
Pt is requesting a refill on Phentermine 37.5mg 

## 2014-02-20 NOTE — Telephone Encounter (Signed)
Notified pt. 

## 2014-03-04 ENCOUNTER — Ambulatory Visit (INDEPENDENT_AMBULATORY_CARE_PROVIDER_SITE_OTHER): Payer: BC Managed Care – PPO | Admitting: Internal Medicine

## 2014-03-04 ENCOUNTER — Encounter: Payer: Self-pay | Admitting: Internal Medicine

## 2014-03-04 VITALS — BP 100/64 | HR 93 | Temp 98.2°F | Ht 61.0 in | Wt 154.5 lb

## 2014-03-04 DIAGNOSIS — L409 Psoriasis, unspecified: Secondary | ICD-10-CM

## 2014-03-04 DIAGNOSIS — E663 Overweight: Secondary | ICD-10-CM

## 2014-03-04 DIAGNOSIS — R413 Other amnesia: Secondary | ICD-10-CM

## 2014-03-04 MED ORDER — PHENTERMINE HCL 37.5 MG PO CAPS: 37.5000 mg | ORAL_CAPSULE | ORAL | Status: DC

## 2014-03-04 NOTE — Progress Notes (Signed)
Pre visit review using our clinic review tool, if applicable. No additional management support is needed unless otherwise documented below in the visit note. 

## 2014-03-04 NOTE — Progress Notes (Signed)
Subjective:    Patient ID: Terri Andrews, female    DOB: June 14, 1980, 33 y.o.   MRN: [id number]  HPI 33YO female presents for follow up.  Psoriasis - Using Halog with no improvement in plaques over arms. No improvement with PUVA in the past. Her dermatologist recently moved, and she will need a new evaluation.  Overweight - Would like to start back on phentermine. Continues to try to follow a healthy diet and exercise program.  ADHD - Had testing for this in July 2015. Reports pos for ADHD. Was told that she would benefit from stimulant meds, but records have not been available regarding testing.   Review of Systems  Constitutional: Negative for fever, chills, appetite change, fatigue and unexpected weight change.  Eyes: Negative for visual disturbance.  Respiratory: Negative for shortness of breath.   Cardiovascular: Negative for chest pain and leg swelling.  Gastrointestinal: Negative for nausea, vomiting, abdominal pain, diarrhea and constipation.  Skin: Positive for color change and rash.  Hematological: Negative for adenopathy. Does not bruise/bleed easily.  Psychiatric/Behavioral: Positive for decreased concentration. Negative for sleep disturbance and dysphoric mood. The patient is not nervous/anxious.        Objective:    BP 100/64  Pulse 93  Temp(Src) 98.2 F (36.8 C) (Oral)  Ht 5\' 1"  (1.549 m)  Wt 154 lb 8 oz (70.081 kg)  BMI 29.21 kg/m2  SpO2 98%  LMP 02/28/2014 Physical Exam  Constitutional: She is oriented to person, place, and time. She appears well-developed and well-nourished. No distress.  HENT:  Head: Normocephalic and atraumatic.  Right Ear: External ear normal.  Left Ear: External ear normal.  Nose: Nose normal.  Mouth/Throat: Oropharynx is clear and moist. No oropharyngeal exudate.  Eyes: Conjunctivae are normal. Pupils are equal, round, and reactive to light. Right eye exhibits no discharge. Left eye exhibits no discharge. No scleral icterus.    Neck: Normal range of motion. Neck supple. No tracheal deviation present. No thyromegaly present.  Cardiovascular: Normal rate, regular rhythm, normal heart sounds and intact distal pulses.  Exam reveals no gallop and no friction rub.   No murmur heard. Pulmonary/Chest: Effort normal and breath sounds normal. No accessory muscle usage. Not tachypneic. No respiratory distress. She has no decreased breath sounds. She has no wheezes. She has no rhonchi. She has no rales. She exhibits no tenderness.  Musculoskeletal: Normal range of motion. She exhibits no edema and no tenderness.  Lymphadenopathy:    She has no cervical adenopathy.  Neurological: She is alert and oriented to person, place, and time. No cranial nerve deficit. She exhibits normal muscle tone. Coordination normal.  Skin: Skin is warm and dry. Rash noted. Rash is pustular. She is not diaphoretic. There is erythema. No pallor.  Erythematous plaques over arms bilaterally  Psychiatric: She has a normal mood and affect. Her behavior is normal. Judgment and thought content normal.          Assessment & Plan:   Problem List Items Addressed This Visit     Unprioritized   Memory loss     She reports that she was diagnosed with ADHD. Will request notes from Dr. Reggy EyeAltabet.    Overweight (BMI 25.0-29.9) - Primary      Wt Readings from Last 3 Encounters:  03/04/14 154 lb 8 oz (70.081 kg)  11/01/13 162 lb 8 oz (73.71 kg)  10/04/13 168 lb 12 oz (76.544 kg)   Body mass index is 29.21 kg/(m^2). Congratulated her on weight loss.  Will finish out one more month of Phentermine. We discussed that phentermine is not safe to take if she is trying to conceive. Encouraged her to continue with healthy diet and exercise.    Relevant Medications      phentermine capsule   Psoriasis     Recent worsening of psoriasis. No improvement with Halog or with PUVA in the past. Will set up evaluation with dermatology.    Relevant Orders      Ambulatory  referral to Dermatology       Return in about 4 weeks (around 04/01/2014).

## 2014-03-04 NOTE — Assessment & Plan Note (Signed)
Recent worsening of psoriasis. No improvement with Halog or with PUVA in the past. Will set up evaluation with dermatology.

## 2014-03-04 NOTE — Patient Instructions (Signed)
We will set up referral to dermatology and get notes from Dr. Reggy EyeAltabet.  Follow up in 4 weeks and as needed.

## 2014-03-04 NOTE — Assessment & Plan Note (Signed)
Wt Readings from Last 3 Encounters:  03/04/14 154 lb 8 oz (70.081 kg)  11/01/13 162 lb 8 oz (73.71 kg)  10/04/13 168 lb 12 oz (76.544 kg)   Body mass index is 29.21 kg/(m^2). Congratulated her on weight loss. Will finish out one more month of Phentermine. We discussed that phentermine is not safe to take if she is trying to conceive. Encouraged her to continue with healthy diet and exercise.

## 2014-03-04 NOTE — Assessment & Plan Note (Signed)
She reports that she was diagnosed with ADHD. Will request notes from Dr. Reggy EyeAltabet.

## 2014-03-05 ENCOUNTER — Telehealth: Payer: Self-pay | Admitting: *Deleted

## 2014-03-05 NOTE — Telephone Encounter (Signed)
Fax from pharmacy, needing PA for Phentermine. Completed online and approved. Pharmacy notified.

## 2014-03-07 ENCOUNTER — Encounter: Payer: Self-pay | Admitting: Internal Medicine

## 2014-05-03 ENCOUNTER — Encounter: Payer: Self-pay | Admitting: Internal Medicine

## 2014-05-03 ENCOUNTER — Ambulatory Visit (INDEPENDENT_AMBULATORY_CARE_PROVIDER_SITE_OTHER): Payer: BC Managed Care – PPO | Admitting: Internal Medicine

## 2014-05-03 VITALS — BP 100/72 | HR 91 | Temp 97.9°F | Resp 14 | Wt 155.8 lb

## 2014-05-03 DIAGNOSIS — F419 Anxiety disorder, unspecified: Secondary | ICD-10-CM

## 2014-05-03 DIAGNOSIS — E663 Overweight: Secondary | ICD-10-CM

## 2014-05-03 MED ORDER — VENLAFAXINE HCL ER 75 MG PO CP24: 75.0000 mg | ORAL_CAPSULE | Freq: Every day | ORAL | Status: DC

## 2014-05-03 NOTE — Progress Notes (Signed)
Pre visit review using our clinic review tool, if applicable. No additional management support is needed unless otherwise documented below in the visit note. 

## 2014-05-03 NOTE — Patient Instructions (Addendum)
Start Effexor 75mg  daily in the morning.  Call if any questions.  Follow up in 4 weeks.

## 2014-05-03 NOTE — Progress Notes (Signed)
   Subjective:    Patient ID: Terri Andrews, female    DOB: Aug 16, 1980, 33 y.o.   MRN: [id number]  HPI 33YO female presents for follow up.  Wt Readings from Last 3 Encounters:  05/03/14 155 lb 12 oz (70.648 kg)  03/04/14 154 lb 8 oz (70.081 kg)  11/01/13 162 lb 8 oz (73.71 kg)   Feeling generally well. Would like to change medication for anxiety. Stopped Celexa 2 days ago as symptoms poorly controlled with this. Would like to consider a medication with some stimulant properties to help with concentration.    Past medical, surgical, family and social history per today's encounter.  Review of Systems  Constitutional: Negative for fever, chills, appetite change, fatigue and unexpected weight change.  Eyes: Negative for visual disturbance.  Respiratory: Negative for shortness of breath.   Cardiovascular: Negative for chest pain and leg swelling.  Gastrointestinal: Negative for abdominal pain.  Skin: Negative for color change and rash.  Hematological: Negative for adenopathy. Does not bruise/bleed easily.  Psychiatric/Behavioral: Positive for decreased concentration. Negative for sleep disturbance and dysphoric mood. The patient is nervous/anxious.        Objective:    BP 100/72 mmHg  Pulse 91  Temp(Src) 97.9 F (36.6 C) (Oral)  Resp 14  Wt 155 lb 12 oz (70.648 kg)  SpO2 97% Physical Exam  Constitutional: She is oriented to person, place, and time. She appears well-developed and well-nourished. No distress.  HENT:  Head: Normocephalic and atraumatic.  Right Ear: External ear normal.  Left Ear: External ear normal.  Nose: Nose normal.  Mouth/Throat: Oropharynx is clear and moist. No oropharyngeal exudate.  Eyes: Conjunctivae are normal. Pupils are equal, round, and reactive to light. Right eye exhibits no discharge. Left eye exhibits no discharge. No scleral icterus.  Neck: Normal range of motion. Neck supple. No tracheal deviation present. No thyromegaly present.    Cardiovascular: Normal rate, regular rhythm, normal heart sounds and intact distal pulses.  Exam reveals no gallop and no friction rub.   No murmur heard. Pulmonary/Chest: Effort normal and breath sounds normal. No accessory muscle usage. No tachypnea. No respiratory distress. She has no decreased breath sounds. She has no wheezes. She has no rhonchi. She has no rales. She exhibits no tenderness.  Musculoskeletal: Normal range of motion. She exhibits no edema or tenderness.  Lymphadenopathy:    She has no cervical adenopathy.  Neurological: She is alert and oriented to person, place, and time. No cranial nerve deficit. She exhibits normal muscle tone. Coordination normal.  Skin: Skin is warm and dry. No rash noted. She is not diaphoretic. No erythema. No pallor.  Psychiatric: She has a normal mood and affect. Her behavior is normal. Judgment and thought content normal.          Assessment & Plan:   Problem List Items Addressed This Visit      Unprioritized   Anxiety - Primary    Will start Effexor 75mg  daily. Discussed potential risks and benefits of this medication. Follow up in 4 weeks or sooner as needed.    Relevant Medications      venlafaxine (EFFEXOR-XR) 24 hr capsule   Overweight (BMI 25.0-29.9)       Return in about 4 weeks (around 05/31/2014) for Recheck.

## 2014-05-03 NOTE — Assessment & Plan Note (Signed)
Will start Effexor 75mg  daily. Discussed potential risks and benefits of this medication. Follow up in 4 weeks or sooner as needed.

## 2014-06-03 ENCOUNTER — Ambulatory Visit: Payer: BC Managed Care – PPO | Admitting: Internal Medicine

## 2014-06-06 ENCOUNTER — Telehealth: Payer: Self-pay | Admitting: Internal Medicine

## 2014-06-06 NOTE — Telephone Encounter (Signed)
Spoke with pt, advised of MDs message and the first available appoint is tomorrow at 1:30 with Terri Andrews.  Pt declined appoint.  She stated she should be seen sooner since it is emergent.  I explained to pt that her leaving a specimen is not the same as being seen and that an Rx could not be written based on specimen only with out being seen by provider.  Pt verbalized understanding and states she will go to urgent care today.

## 2014-06-06 NOTE — Telephone Encounter (Signed)
The patient is wanting a call back from Dr. Tilman NeatWalker's nurse . She is wanting to come in to leave a urine specimen. I have offered the patient an appointment with Lyla Sonarrie and she would not except.

## 2014-06-07 ENCOUNTER — Ambulatory Visit (INDEPENDENT_AMBULATORY_CARE_PROVIDER_SITE_OTHER): Payer: BC Managed Care – PPO | Admitting: Nurse Practitioner

## 2014-06-07 ENCOUNTER — Encounter: Payer: Self-pay | Admitting: Nurse Practitioner

## 2014-06-07 VITALS — BP 118/78 | HR 93 | Temp 97.9°F | Resp 12 | Ht 61.0 in | Wt 152.4 lb

## 2014-06-07 DIAGNOSIS — N390 Urinary tract infection, site not specified: Secondary | ICD-10-CM | POA: Insufficient documentation

## 2014-06-07 MED ORDER — SULFAMETHOXAZOLE-TRIMETHOPRIM 800-160 MG PO TABS: 1.0000 | ORAL_TABLET | Freq: Two times a day (BID) | ORAL | Status: DC

## 2014-06-07 NOTE — Progress Notes (Signed)
Pre visit review using our clinic review tool, if applicable. No additional management support is needed unless otherwise documented below in the visit note. 

## 2014-06-07 NOTE — Assessment & Plan Note (Signed)
Worsening. POCT urine suggests infection. Bactrim DS one tablet twice daily for 5 days. FU prn worsening/failure to improve.

## 2014-06-07 NOTE — Patient Instructions (Signed)
Please call us if symptoms worsen or fail to improve.   Take the full dose of antibiotic. Please take a probiotic ( Align, Floraque or Culturelle) while you are on the antibiotic to prevent a serious antibiotic associated diarrhea  Called clostirudium dificile colitis and a vaginal yeast infection.

## 2014-06-07 NOTE — Progress Notes (Signed)
Subjective:    Patient ID: Terri Andrews, female    DOB: January 02, 1981, 34 y.o.   MRN: [id number]  HPI Terri Andrews is a 34 yo female with a CC of urine odor, urgency, and incomplete bladder emptying.   1) A week ago noticed a smell like eggs she reported. Low back pain, urge to go, and incomplete bladder emptying.   No abx use in last 3 months LMP- 05/29/14 normal for pt and 5 days in length  Review of Systems  Constitutional: Negative for fever, chills, diaphoresis and fatigue.  Respiratory: Negative for chest tightness, shortness of breath and wheezing.   Cardiovascular: Negative for chest pain, palpitations and leg swelling.  Gastrointestinal: Negative for nausea, vomiting and diarrhea.  Genitourinary: Positive for urgency, frequency, flank pain and decreased urine volume. Negative for dysuria, hematuria and menstrual problem.       Odor of urine  Skin: Negative for rash.  Neurological: Negative for dizziness, weakness, numbness and headaches.  Psychiatric/Behavioral: The patient is not nervous/anxious.    Past Medical History  Diagnosis Date  . Complete miscarriage   . Psoriasis     History   Social History  . Marital Status: Married    Spouse Name: N/A    Number of Children: N/A  . Years of Education: N/A   Occupational History  . Not on file.   Social History Main Topics  . Smoking status: Never Smoker   . Smokeless tobacco: Not on file  . Alcohol Use: No  . Drug Use: No  . Sexual Activity: Not on file   Other Topics Concern  . Not on file   Social History Narrative    Past Surgical History  Procedure Laterality Date  . Knee surgery      cyst removal  . Cervical biopsy    . Uterine septum resection  04/2012    [facility]    Family History  Problem Relation Age of Onset  . Rashes / Skin problems Mother   . Hyperlipidemia Father   . Rashes / Skin problems Father   . Rashes / Skin problems Sister     No Known Allergies  Current  Outpatient Prescriptions on File Prior to Visit  Medication Sig Dispense Refill  . venlafaxine XR (EFFEXOR-XR) 75 MG 24 hr capsule Take 1 capsule (75 mg total) by mouth daily with breakfast. 30 capsule 3   No current facility-administered medications on file prior to visit.       Objective:   Physical Exam  Constitutional: She is oriented to person, place, and time. She appears well-developed and well-nourished. No distress.  HENT:  Head: Normocephalic and atraumatic.  Right Ear: External ear normal.  Left Ear: External ear normal.  Neck: Neck supple.  Cardiovascular: Normal rate, regular rhythm, normal heart sounds and intact distal pulses.  Exam reveals no gallop and no friction rub.   No murmur heard. Pulmonary/Chest: Effort normal and breath sounds normal. No respiratory distress. She has no wheezes. She has no rales. She exhibits no tenderness.  Neurological: She is alert and oriented to person, place, and time. No cranial nerve deficit. She exhibits normal muscle tone. Coordination normal.  Skin: Skin is warm and dry. No rash noted. She is not diaphoretic.  Psychiatric: She has a normal mood and affect. Her behavior is normal. Judgment and thought content normal.   BP 118/78 mmHg  Pulse 93  Temp(Src) 97.9 F (36.6 C) (Oral)  Resp 12  Ht 5'  1" (1.549 m)  Wt 152 lb 6.4 oz (69.128 kg)  BMI 28.81 kg/m2  SpO2 98%     Assessment & Plan:

## 2014-06-10 LAB — POCT URINALYSIS DIPSTICK
Bilirubin, UA: NEGATIVE
GLUCOSE UA: NEGATIVE
Ketones, UA: NEGATIVE
NITRITE UA: POSITIVE
PH UA: 7
Spec Grav, UA: 1.02
Urobilinogen, UA: 1

## 2014-06-10 LAB — URINE CULTURE: Colony Count: >=100000

## 2014-06-10 NOTE — Addendum Note (Signed)
Addended by: Montine CircleMALDONADO, Lesley Atkin D on: 06/10/2014 03:58 PM   Modules accepted: Orders

## 2014-07-05 ENCOUNTER — Ambulatory Visit: Payer: BC Managed Care – PPO | Admitting: Internal Medicine

## 2014-07-05 ENCOUNTER — Encounter: Payer: Self-pay | Admitting: Internal Medicine

## 2014-08-07 ENCOUNTER — Ambulatory Visit: Payer: BC Managed Care – PPO | Admitting: Internal Medicine

## 2014-08-07 ENCOUNTER — Telehealth: Payer: Self-pay | Admitting: Internal Medicine

## 2014-08-07 NOTE — Telephone Encounter (Signed)
Dr Dan HumphreysWalker now says yes you can remove pt from schedule

## 2014-08-07 NOTE — Telephone Encounter (Signed)
Per Dr Dan HumphreysWalker, Do not remove from schedule

## 2014-08-07 NOTE — Telephone Encounter (Signed)
Please advise to cancel off scheduled for today. Pt has rescheduled appt/msn

## 2014-08-28 ENCOUNTER — Encounter: Payer: Self-pay | Admitting: Internal Medicine

## 2014-08-28 ENCOUNTER — Ambulatory Visit (INDEPENDENT_AMBULATORY_CARE_PROVIDER_SITE_OTHER): Payer: BC Managed Care – PPO | Admitting: Internal Medicine

## 2014-08-28 VITALS — BP 111/69 | HR 90 | Temp 98.1°F | Ht 61.0 in | Wt 158.5 lb

## 2014-08-28 DIAGNOSIS — F419 Anxiety disorder, unspecified: Secondary | ICD-10-CM

## 2014-08-28 DIAGNOSIS — L409 Psoriasis, unspecified: Secondary | ICD-10-CM | POA: Diagnosis not present

## 2014-08-28 MED ORDER — VENLAFAXINE HCL ER 150 MG PO CP24: 150.0000 mg | ORAL_CAPSULE | Freq: Every day | ORAL | Status: DC

## 2014-08-28 NOTE — Progress Notes (Signed)
   Subjective:    Patient ID: Terri Andrews, female    DOB: July 25, 1980, 34 y.o.   MRN: [id number]  HPI  33YO female presents for follow up.  Feeling more anxious lately. Baby just started daycare. Has 14YO daughter at home.  Psoriasis - Stopped medication because of nausea. Plans to start Humira after insurance approval. Followed by Dr. Roseanne RenoStewart.  Had 2 UTIs in 2 months while on previous meds for psoriasis. No current symptoms of dysuria, frequency.  Past medical, surgical, family and social history per today's encounter.  Review of Systems  Constitutional: Negative for fever, chills, appetite change, fatigue and unexpected weight change.  Eyes: Negative for visual disturbance.  Respiratory: Negative for shortness of breath.   Cardiovascular: Negative for chest pain and leg swelling.  Gastrointestinal: Negative for abdominal pain.  Skin: Positive for rash. Negative for color change.  Hematological: Negative for adenopathy. Does not bruise/bleed easily.  Psychiatric/Behavioral: Negative for suicidal ideas, sleep disturbance and dysphoric mood. The patient is nervous/anxious.        Objective:    BP 111/69 mmHg  Pulse 90  Temp(Src) 98.1 F (36.7 C) (Oral)  Ht 5\' 1"  (1.549 m)  Wt 158 lb 8 oz (71.895 kg)  BMI 29.96 kg/m2  SpO2 100%  LMP 08/26/2014 Physical Exam  Constitutional: She is oriented to person, place, and time. She appears well-developed and well-nourished. No distress.  HENT:  Head: Normocephalic and atraumatic.  Right Ear: External ear normal.  Left Ear: External ear normal.  Nose: Nose normal.  Mouth/Throat: Oropharynx is clear and moist. No oropharyngeal exudate.  Eyes: Conjunctivae are normal. Pupils are equal, round, and reactive to light. Right eye exhibits no discharge. Left eye exhibits no discharge. No scleral icterus.  Neck: Normal range of motion. Neck supple. No tracheal deviation present. No thyromegaly present.  Cardiovascular: Normal rate,  regular rhythm, normal heart sounds and intact distal pulses.  Exam reveals no gallop and no friction rub.   No murmur heard. Pulmonary/Chest: Effort normal and breath sounds normal. No respiratory distress. She has no wheezes. She has no rales. She exhibits no tenderness.  Musculoskeletal: Normal range of motion. She exhibits no edema or tenderness.  Lymphadenopathy:    She has no cervical adenopathy.  Neurological: She is alert and oriented to person, place, and time. No cranial nerve deficit. She exhibits normal muscle tone. Coordination normal.  Skin: Skin is warm and dry. Rash noted. Rash is pustular (over neck, arms, c/w known psoriasis.). She is not diaphoretic. No erythema. No pallor.  Psychiatric: Her speech is normal and behavior is normal. Judgment and thought content normal. Her mood appears anxious. Cognition and memory are normal.          Assessment & Plan:   Problem List Items Addressed This Visit      Unprioritized   Anxiety - Primary    Anxiety recently worsened. Will increase Venlafaxine to 150mg  daily. Follow up in 2 months and prn.      Relevant Medications   venlafaxine (EFFEXOR-XR) 24 hr capsule   Psoriasis    Unable to tolerate Apremilast. Starting on Humira once approved by insurance. TB testing completed. Will follow up in 2 months.          Return in about 8 weeks (around 10/23/2014) for Recheck.

## 2014-08-28 NOTE — Assessment & Plan Note (Signed)
Anxiety recently worsened. Will increase Venlafaxine to 150mg  daily. Follow up in 2 months and prn.

## 2014-08-28 NOTE — Assessment & Plan Note (Signed)
Unable to tolerate Apremilast. Starting on Humira once approved by insurance. TB testing completed. Will follow up in 2 months.

## 2014-08-28 NOTE — Progress Notes (Signed)
Pre visit review using our clinic review tool, if applicable. No additional management support is needed unless otherwise documented below in the visit note. 

## 2014-08-28 NOTE — Patient Instructions (Signed)
Increase Venlafaxine to 150mg  daily.  Follow up 4 weeks.

## 2014-09-11 ENCOUNTER — Ambulatory Visit (INDEPENDENT_AMBULATORY_CARE_PROVIDER_SITE_OTHER): Payer: BC Managed Care – PPO | Admitting: Nurse Practitioner

## 2014-09-11 ENCOUNTER — Encounter: Payer: Self-pay | Admitting: Nurse Practitioner

## 2014-09-11 VITALS — BP 110/62 | HR 110 | Temp 98.6°F | Resp 12 | Ht 61.0 in | Wt 155.0 lb

## 2014-09-11 DIAGNOSIS — R300 Dysuria: Secondary | ICD-10-CM

## 2014-09-11 LAB — POCT URINALYSIS DIPSTICK
Glucose, UA: NEGATIVE
Ketones, UA: NEGATIVE
NITRITE UA: NEGATIVE
PH UA: 7
PROTEIN UA: 100
SPEC GRAV UA: 1.025
UROBILINOGEN UA: 4

## 2014-09-11 MED ORDER — CIPROFLOXACIN HCL 500 MG PO TABS
500.0000 mg | ORAL_TABLET | Freq: Two times a day (BID) | ORAL | Status: DC
Start: 2014-09-11 — End: 2014-10-15

## 2014-09-11 NOTE — Patient Instructions (Signed)
Let me know if it bothers your stomach.   Please take a probiotic ( Align, Floraque or Culturelle) while you are on the antibiotic to prevent a serious antibiotic associated diarrhea  Called clostirudium dificile colitis and a vaginal yeast infection.

## 2014-09-11 NOTE — Progress Notes (Signed)
Pre visit review using our clinic review tool, if applicable. No additional management support is needed unless otherwise documented below in the visit note. 

## 2014-09-11 NOTE — Progress Notes (Signed)
   Subjective:    Patient ID: Terri Andrews, female    DOB: 26-Dec-1980, 34 y.o.   MRN: [id number]  HPI  Terri Andrews is a 34 yo female with a CC of dysuria x 3 days.   1) Burning, urgency, strong odor   Review of Systems  Constitutional: Negative for fever, chills, diaphoresis and fatigue.  Genitourinary: Positive for dysuria, urgency and frequency.       Strong odor       Objective:   Physical Exam  Constitutional: She is oriented to person, place, and time. She appears well-developed and well-nourished. No distress.  BP 110/62 mmHg  Pulse 110  Temp(Src) 98.6 F (37 C) (Oral)  Resp 12  Ht 5\' 1"  (1.549 m)  Wt 155 lb (70.308 kg)  BMI 29.30 kg/m2  SpO2 99%  LMP 08/26/2014   HENT:  Head: Normocephalic and atraumatic.  Right Ear: External ear normal.  Left Ear: External ear normal.  Mouth/Throat: No oropharyngeal exudate.  Eyes: EOM are normal. Pupils are equal, round, and reactive to light.  Cardiovascular: Normal rate and regular rhythm.   Pulmonary/Chest: Effort normal and breath sounds normal.  Neurological: She is alert and oriented to person, place, and time.  Skin: Skin is warm and dry. No rash noted. She is not diaphoretic.  Psychiatric: She has a normal mood and affect. Her behavior is normal. Judgment and thought content normal.         Assessment & Plan:

## 2014-09-13 NOTE — Consult Note (Signed)
Referral Information:  Reason for Referral Leaking fluid at 16 weeks   Referring Physician [facility]. inpatient consult   Prenatal Hx Terri Andrews is a 34 year-old G2 P0010 at 33 1/7 weeks (EDC of 04/24/13 provided by [facility]) who was admitted from their office yesterday with evidence of ruptured membranes.  Pt states that yesterday she woke up in a pool of fluid and continued to leak. She was evaluated at the [facility] office where she was found to have pooling of vaginal fluid that was nitrazine and fern positive. She was sent to L&D for further management. Overnight, [name] states she had has a small amount of leakage. She denies vaginal bleeding, abdominal pain or contractions. She also denies fever and feels well.  Pregnancy complicated by vaginal bleeding at 10 weeks that lasted 1-2 days. She is not aware if she was diagnosed with a subchorionic hemorrhage or not.  She has a history of a uterine septum that was treated by [facility] in December 2013 via hysteroscopic resection. She was placed on hormones following the procedure and had an intrauterine balloon in place for a few weeks. Following this, she had normal cycles and got pregnant on clomid. Her septum was identified during a routine infertility evaluation at [facility].   Past Obstetrical Hx G2 P0010  2012: Missed abortion at 6-7 weeks. Initially had hearbeat, began bleeding, no further cardiac activity seen. Took Misoprostol. No D&C required.   Home Medications: Medication Instructions Status  Prenatabs Rx Prenatal Multivitamins with Folic Acid 1 mg oral tablet 1 tab(s) orally once a day Active  Colace sodium 50 mg oral capsule 1 cap(s) orally 2 times a day Active   Allergies:   No Known Allergies:   Vital Signs/Notes:  Nursing Vital Signs: **Vital Signs.:   19-Jun-14 01:03  Pulse Pulse 94  Systolic BP Systolic BP 113  Diastolic BP (mmHg) Diastolic BP (mmHg) 67    06:35  Pulse Pulse 88  Systolic BP  Systolic BP 111  Diastolic BP (mmHg) Diastolic BP (mmHg) 63    07:10  Pulse Pulse 91  Systolic BP Systolic BP 104  Diastolic BP (mmHg) Diastolic BP (mmHg) 67    12:09  Vital Signs Type Routine  Temperature Temperature (F) 99.1  Celsius 37.2  Temperature Source oral  Pulse Pulse 107  Respirations Respirations 20  Systolic BP Systolic BP 104  Diastolic BP (mmHg) Diastolic BP (mmHg) 56  Mean BP 72  Pulse Ox % Pulse Ox % 99  Oxygen Delivery Room Air/ 21 %   Perinatal Consult:  PGyn Hx Denies history of abnormal paps or STDS. No procedures performed on cervix.   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   Review Of Systems:  Subjective No complaints. No vaginal bleeding, contractions or pressure. Still with leakage of fluid. Good Fetal movement. No abdominal pain. No fever   Fever/Chills No   Abdominal Pain No   Nausea/Vomiting No   SOB/DOE No   Chest Pain No   Tolerating Diet Yes   Exam:  Abdomen Soft, NT, ND, No guarding. Fundus NT. No LE edema or cords.   Pelvic External See  H&P by [facility]     Routine Hem:  18-Jun-14 14:10   WBC (CBC)  13.7  Hemoglobin (CBC) 12.4  Hematocrit (CBC) 35.5  Platelet Count (CBC) 269  Neutrophil #  11.4  19-Jun-14 05:25   WBC (CBC) 11.0 (Result(s) reported on 09 Nov 2012 at 05:55AM.)    Additional Lab/Radiology Notes US ([facility]): 11/09/12, see separate report   Impression/Recommendations:  Impression 34 year-old G2 P0010 at 2116 1/7 weeks with preterm, premature rupture of the membranes without evidence of preterm labor.   Recommendations  I had a long discussion with the patient and her family concerning this high-risk situation. We discussed potential maternal and fetal/neonatal complications assoicated with early second trimester PPROM.  It is reassuring that she now has minimal leakage of fluid and there is some amniotic fluid seen on today's ultrasound (MVP 2.3, subjectively low) but the chance of delivering a healthy infant remains quite low.  We discussed risk of fetal death, preterm labor, severe maternal infections, delivery at a periviable gestation and associated risks of handicaps, as well as prolonged pregnancy and delivery near-term but with pulmonary hypoplasia.    -Pt declines termination of pregnancy -Desires expectant managment and she understands the potential maternal and/or fetal/neonatal risks associated with expectant management of early PPROM -OK for discharge home on pelvic and modified bedrest -We discussed signs and symptoms of maternal infection and need to contact a provider immediately if develops them -Needs to be seen weekly to assess for signs of maternal infection and check fetal viability and AFI. We will be happy to help with weekly AFI measurements and see her if desired. I discussed the case with Dr. Tiburcio PeaHarris and they will [facility] at [facility] until 22 5/7 weeks.  -Plan to admit to [facility] at 22 5/7 weeks.  Please call the [facility] office the week of July 28th (Monday or Thursday) and we can help make [facility] for steroids and inpatient management until delivery -I have contacted Dr. Tiburcio PeaHarris to discuss the plan.  Please call Terri Andrews with any questions.    Total Time Spent with Patient > 60 minutes   >50% of visit spent in couseling/coordination of care yes   Office Use Only 99254  INPT Consult Comprehensive Moderate (80 min)   Coding Description: MATERNAL CONDITIONS/HISTORY INDICATION(S).   Premature rupture of  membranes.  Electronic Signatures: Reisha Wos, [place] (MD)  (Signed 19-Jun-14 16:04)  Authored: Referral, Home Medications, Allergies, Vital Signs/Notes, Consult, Exam, Lab, Lab/Radiology Notes, Impression, Billing, Coding Description   Last Updated: 19-Jun-14 16:04 by Onell Mcmath, [place] (MD)

## 2014-09-13 NOTE — Consult Note (Signed)
Referral Information:  Reason for Referral PPROM since 16 weeks - here for follow up ultrasound and MD visit   Referring Physician [facility]   Prenatal Hx Terri Andrews is a 34 year-old G2 P0010 at 75 2/7 weeks (EDC of 04/24/13 by LMP = 6 6/7 week [place]) who was admitted from [facility] office at 16 weeks with evidence of ruptured membranes.  She was evaluated in the hospital, seen by MFM at [facility] as an inpatient, and subsequently discharged home to bedrest and weekly monitoring with plans for admission to [facility] at 22 5/7 weeks (if still pregnant).  She continues to have daily leakage of fluid but denies cramping, bleeding, fever or abdominal pain.   Past Obstetrical Hx G2 P0010  2012: Missed abortion at 6-7 weeks. Initially had hearbeat, began bleeding, no further cardiac activity seen. Took Misoprostol. No D&C required.   Home Medications: Medication Instructions Status  Prenatabs Rx Prenatal Multivitamins with Folic Acid 1 mg oral tablet 1 tab(s) orally once a day Active   Allergies:   No Known Allergies:   Vital Signs/Notes:  Nursing Vital Signs: **Vital Signs.:   10-Jul-14 13:00  Vital Signs Type Routine  Temperature Temperature (F) 99.1  Celsius 37.2  Temperature Source oral  Pulse Pulse 119  Respirations Respirations 18  Systolic BP Systolic BP 118  Diastolic BP (mmHg) Diastolic BP (mmHg) 64  Mean BP 82  BP Source  if not from Vital Sign Device Dinamap  Pulse Ox % Pulse Ox % 99  Oxygen Delivery Room Air/ 21 %   Perinatal Consult:  PGyn Hx Denies history of abnormal paps or STDS. No procedures performed on cervix.   PSurg Hx Hysteroscopic resection of a uterine septum Dec 2013    Review Of Systems:  Subjective No complaints. No vaginal bleeding, contractions or pressure. Still with leakage of fluid. Good Fetal movement. No abdominal pain. No fever   Fever/Chills No   Abdominal Pain No   Nausea/Vomiting No   SOB/DOE No   Chest Pain No    Tolerating Diet Yes   Medications/Allergies Reviewed Medications/Allergies reviewed    Exam:  Abdomen Soft, NT, ND, No guarding. Fundus NT. No LE edema or cords.   Pelvic External See H&P by [facility]     [place]:    10-Jul-14 13:02, MFM OB US, Detailed, Single Fetus  MFM OB US, Detailed, Single Fetus   Indication: PPROM, Anatomy, Growth.   ____________________________________________________________________________  History: Age: 87 years.  ____________________________________________________________________________  Dating:  LMP:     07/18/2012   EDC:     04/24/2013     GA by LMP:     [redacted]w[redacted]d  Earlier Assessment on:     09/05/2012     EDC:     04/25/2013     GA by earlier  assessment:     [redacted]w[redacted]d  Current Scan on:     11/30/2012     EDC:     04/24/2013     GA by current scan:      [redacted]w[redacted]d  Best Overall Assessment:     11/30/2012     EDC:     04/24/2013     Assessed GA:      [redacted]w[redacted]d  The calculation of the gestational age by current scan was based on BPD, HC, FL  and HUM.  The Best Overall Assessment is based on the LMP.  ____________________________________________________________________________  Anatomy Scan:  Singleton gestation.  Biometry:  BPD     44.6  mm      -     [redacted]w[redacted]d     ([redacted]w[redacted]d to [redacted]w[redacted]d)  HC     170.9     mm      -     [redacted]w[redacted]d     ([redacted]w[redacted]d to 102w1d)  FL     29.7     mm      -[redacted]w[redacted]d     ([redacted]w[redacted]d to [redacted]w[redacted]d)  HUM     28.2     mm      -     [redacted]w[redacted]d     Fetal heart activity: present. Fetal heart rate: 152 bpm.   Fetal presentation: cephalic.   Amniotic fluid: reduced. AFI  6.5 cm. MVP 2.5 cm.   Cord: normal.   Placenta: anterior. No evidence of previa.     Fetal Anatomy:  Visualized with normal appearance: brain, face, spine, 4 chamber heart and great  vessels, abdominal wall, gastrointestinal tract, kidneys, bladder.    Extremities: visualized previously.    ____________________________________________________________________________  Maternal Structures:  Cervical  length 22 mm.  ____________________________________________________________________________  Report Summary:  Impression: Single intrauterine pregnancy with an estimated gestational age of  [redacted] weeks 2 day(s).  Dating assigned by LMP concordant with ultrasound performed  at [facility] on 09/05/2012; measurements at that exam reported as  6 weeks  6 days.    Fetal anatomy visualized appears normal or has been documented previously.   Heart views appear normal although there is what appears to be a flap valve or  prominent sinus at the level of the left atrium.    Appropriate interval growth.    Amniotic fluid volume again appears reduced although there are good pockets  of fluid (largest is 2.3 x 5.7 cm) and the maximum vertical pocket of fluid is  2.5 cm.    Transabdominal cervical length is slightly shorter than prior ultrasound,  measuring 2.2 cm (previously 2.8 cm).    Findings were discussed.  See separate consult for details. CODING  DESCRIPTION:  PPROM.   Recommendations: Recommend continued weekly assessment for maternal health and  AFI.  Will schedule fetal echocardiogram for further evaluation of the fetal  heart and follow up ultrasound in 3 weeks prior to consideration for  admission.    Thank you for allowing [place] to participate in her care.  Electronically signed by:  Kirby Funk, MD   (Removed):    Impression/Recommendations:  Impression 34 year-old G2 P0010 at 48 2/7 weeks with preterm, premature rupture of the membranes without evidence of preterm labor. 1.  Growth is appropriate today.  Fluid remains low but there are good pockets present and the MVP is over 2cm. 2.  Cervix is slightly short at 2.2 cm - transvaginal ultrasound was not performed. 3.  ? prominent sinus at the level of the left atrium - suspect normal but will order fetal echocardiogram.   Recommendations 1.  Continue pelvic rest and modified bedrest with daily assessment for fever and weekly  office visits both for maternal well being and to assess FHR. 2.  May consider fluid assessment by ultrasound weekly (patient is very focused on the AFI number which I tried to tell her is very subjective and probably not all that helpful at this early gestational age.  Focusing more on the presence of fluid at all and MVP may be more helpful).   3.  We discussed again expectations regarding the pregnancy - she remains at very high risk for infection and preterm  labor/delivery.  We discussed that our goal was not to get to the periviable gestational age of 23-24 weeks and that admission at 22 weeks 5 days did NOT mean that we would offer intervention for fetal well-being, simply that we would offer steroids and antibiotics for latency and engage in a conversation with NICU regarding her wishes and expectations.   4.  Patient has also been feeling very guilty every time she gets up and has leakage of fluid - we spent a long time discussing that this was NORMAL and was going to continue to happen as long as the fetus continued to produce fluid (which was normal!).  I tried to reassure her that leakage of small amounts of fluid when she got up was not going to ultimately affect the long term outcome. 5.  Per discussion with Dr. Leone BrandGrotegut, will [facility] 22 5/7 [facility].  Will have the patient return here in 3 weeks for follow up and to help arrange for this.  She is aware that admission will be until delivery.  She is also aware that should she develop signs or symptoms of infection, delivery will be indicated, regardless of gestational age. 6.  Fetal echocardiogram ordered (see above).    Total Time Spent with Patient 40 minutes   >50% of visit spent in couseling/coordination of care yes   Office Use Only 99215  Office Visit Level 5 (40min) EST comprehensive office/outpt   Coding Description: MATERNAL CONDITIONS/HISTORY INDICATION(S).   Premature rupture of  membranes.  Electronic Signatures: Kirby FunkEllestad, Taj Arteaga (MD)  (Signed 10-Jul-14 15:29)  Authored: Referral, Home Medications, Allergies, Vital Signs/Notes, Consult, Exam, Radiology, Lab/Radiology Notes, Impression, Billing, Coding Description   Last Updated: 10-Jul-14 15:29 by Kirby FunkEllestad, Reggie Welge (MD)

## 2014-09-15 LAB — URINE CULTURE: Colony Count: 80000

## 2014-09-21 DIAGNOSIS — R300 Dysuria: Secondary | ICD-10-CM | POA: Insufficient documentation

## 2014-09-21 NOTE — Assessment & Plan Note (Signed)
POCT urine shows possible UTI, will obtain culture and place pt on cipro 500 mg twice daily for 5 days. FU prn worsening/failure to improve.

## 2014-10-01 NOTE — H&P (Signed)
L&D Evaluation:  History:  HPI 34 year old G2 P0010 with EDC=04/24/2013 by LMP=07/19/2011 presents from office at 16 1/7 weeks with PPROM. She has had several gushes of fluid since yesterday at 1000. Was seen and evaluated in office today and had  a SSE positive for Nitrazine and ferning. Denies fever, VB, of abdominal pain. Conceived via  ART/Clomid and had some bleeding at [redacted] weeks gestation.   Presents with leaking fluid   Patient's Surgical History Uterine septoplasty, right knee surgery   Medications Pre Natal Vitamins  Colace   Allergies NKDA   Social History none   Family History Non-Contributory   ROS:  ROS see HPI   Exam:  Vital Signs stable   General no apparent distress   Mental Status clear   Chest clear   Heart normal sinus rhythm, no murmur/gallop/rubs   Abdomen FH at 1/2 between U and SP. FHTs WNL in office. ABD soft, NT   Edema no edema   Reflexes 1+   Pelvic cervix closed and thick, done in office by Dr Patton SallesWeaver Lee: SSE positive for Nitrazine and fern.   Mebranes Ruptured   Description clear   FHT 150s in office   Other WBC today:13.7   Impression:  Impression IUP at 16 1/7 weeks with PPROM   Plan:  [facility] and observation.  Dr Patton SallesWeaver Lee discussed case with [facility]- Will consult DPN in AM. Explained to patient the significance of PPROM 8 weeks prior to viability. Discussed the increased risk of  starting labor and delivering as well as the risk for infection/sepsis. Explained  importance of amniotic fluid to the development of the baby.  Discussed the very small chance of the leak "resealing' and the fetus reaching viability.  Encouraged her to drink fluids to try to replenish AF. Repeat WBC in AM.   Electronic Signatures: Trinna BalloonGutierrez, Genessa Beman L (CNM)  (Signed 18-Jun-14 18:35)  Authored: L&D Evaluation   Last Updated: 18-Jun-14 18:35 by Trinna BalloonGutierrez, Broady Lafoy L (CNM)

## 2014-10-15 ENCOUNTER — Encounter: Payer: Self-pay | Admitting: Nurse Practitioner

## 2014-10-15 ENCOUNTER — Ambulatory Visit (INDEPENDENT_AMBULATORY_CARE_PROVIDER_SITE_OTHER): Payer: BC Managed Care – PPO | Admitting: Nurse Practitioner

## 2014-10-15 VITALS — BP 110/70 | HR 100 | Temp 98.6°F | Resp 12 | Ht 61.0 in | Wt 157.8 lb

## 2014-10-15 DIAGNOSIS — J029 Acute pharyngitis, unspecified: Secondary | ICD-10-CM | POA: Diagnosis not present

## 2014-10-15 MED ORDER — AMOXICILLIN-POT CLAVULANATE 875-125 MG PO TABS: 1.0000 | ORAL_TABLET | Freq: Two times a day (BID) | ORAL | Status: DC

## 2014-10-15 NOTE — Patient Instructions (Signed)
Take the augmentin as directed (covers for strep). 10 days  Call us if not improving by Friday.

## 2014-10-15 NOTE — Progress Notes (Signed)
Pre visit review using our clinic review tool, if applicable. No additional management support is needed unless otherwise documented below in the visit note. 

## 2014-10-15 NOTE — Progress Notes (Signed)
   Subjective:    Patient ID: Terri Andrews, female    DOB: 07-25-1980, 34 y.o.   MRN: [id number]  HPI  Ms. Hollice EspyGibson is a 34 yo female with a CC sore throat pain and ear pain x 2 weeks.   1) Ear pain left more than right, sore throat. She reports Sat had blister that stayed until Sunday then disappeared.   Pain at night mostly Ibuprofen- helpful   Review of Systems  Constitutional: Negative for fever, chills, diaphoresis and fatigue.  HENT: Positive for ear pain and sore throat. Negative for congestion, postnasal drip, rhinorrhea, sinus pressure, sneezing and trouble swallowing.   Respiratory: Negative for chest tightness, shortness of breath and wheezing.   Cardiovascular: Negative for chest pain, palpitations and leg swelling.  Gastrointestinal: Negative for nausea, vomiting and diarrhea.  Skin: Negative for rash.  Neurological: Negative for dizziness, weakness, numbness and headaches.  Psychiatric/Behavioral: The patient is not nervous/anxious.       Objective:   Physical Exam  Constitutional: She is oriented to person, place, and time. She appears well-developed and well-nourished. No distress.  BP 110/70 mmHg  Pulse 100  Temp(Src) 98.6 F (37 C) (Oral)  Resp 12  Ht 5\' 1"  (1.549 m)  Wt 157 lb 12.8 oz (71.578 kg)  BMI 29.83 kg/m2  SpO2 97%  LMP 10/01/2014 (Approximate)   HENT:  Head: Normocephalic and atraumatic.  Right Ear: External ear normal.  Left Ear: External ear normal.  Eyes: EOM are normal. Pupils are equal, round, and reactive to light. Right eye exhibits no discharge. Left eye exhibits no discharge. No scleral icterus.  Cardiovascular: Normal rate, regular rhythm, normal heart sounds and intact distal pulses.  Exam reveals no gallop and no friction rub.   No murmur heard. Pulmonary/Chest: Effort normal and breath sounds normal. No respiratory distress. She has no wheezes. She has no rales. She exhibits no tenderness.  Neurological: She is alert and oriented  to person, place, and time. No cranial nerve deficit. She exhibits normal muscle tone. Coordination normal.  Skin: Skin is warm and dry. No rash noted. She is not diaphoretic.  Psychiatric: She has a normal mood and affect. Her behavior is normal. Judgment and thought content normal.      Assessment & Plan:

## 2014-10-30 DIAGNOSIS — J029 Acute pharyngitis, unspecified: Secondary | ICD-10-CM | POA: Insufficient documentation

## 2014-10-30 NOTE — Assessment & Plan Note (Signed)
Discussed coxsackie virus (child just had hand/foot/mouth) vs. Strep. Will treat with Augmentin twice daily x 10 days. Asked her to follow up if not helpful.

## 2014-11-06 ENCOUNTER — Encounter: Payer: Self-pay | Admitting: Internal Medicine

## 2014-11-06 ENCOUNTER — Ambulatory Visit (INDEPENDENT_AMBULATORY_CARE_PROVIDER_SITE_OTHER): Payer: BC Managed Care – PPO | Admitting: Internal Medicine

## 2014-11-06 VITALS — BP 106/72 | HR 91 | Temp 98.0°F | Ht 61.0 in | Wt 158.2 lb

## 2014-11-06 DIAGNOSIS — L409 Psoriasis, unspecified: Secondary | ICD-10-CM | POA: Diagnosis not present

## 2014-11-06 DIAGNOSIS — R42 Dizziness and giddiness: Secondary | ICD-10-CM | POA: Diagnosis not present

## 2014-11-06 LAB — COMPREHENSIVE METABOLIC PANEL
ALBUMIN: 4.3 g/dL (ref 3.5–5.2)
ALT: 10 U/L (ref 0–35)
AST: 15 U/L (ref 0–37)
Alkaline Phosphatase: 61 U/L (ref 39–117)
BUN: 13 mg/dL (ref 6–23)
CHLORIDE: 104 meq/L (ref 96–112)
CO2: 29 mEq/L (ref 19–32)
Calcium: 9.2 mg/dL (ref 8.4–10.5)
Creatinine, Ser: 0.67 mg/dL (ref 0.40–1.20)
GFR: 108.52 mL/min (ref >60.00)
GLUCOSE: 83 mg/dL (ref 70–99)
Potassium: 3.8 mEq/L (ref 3.5–5.1)
SODIUM: 138 meq/L (ref 135–145)
Total Bilirubin: 0.5 mg/dL (ref 0.2–1.2)
Total Protein: 7 g/dL (ref 6.0–8.3)

## 2014-11-06 LAB — CBC WITH DIFFERENTIAL/PLATELET
BASOS PCT: 0.7 % (ref 0.0–3.0)
Basophils Absolute: 0 10*3/uL (ref 0.0–0.1)
EOS ABS: 0.2 10*3/uL (ref 0.0–0.7)
Eosinophils Relative: 2.3 % (ref 0.0–5.0)
HCT: 42.7 % (ref 36.0–46.0)
HEMOGLOBIN: 14 g/dL (ref 12.0–15.0)
LYMPHS PCT: 30.7 % (ref 12.0–46.0)
Lymphs Abs: 2.2 10*3/uL (ref 0.7–4.0)
MCHC: 32.8 g/dL (ref 30.0–36.0)
MCV: 81.3 fl (ref 78.0–100.0)
MONOS PCT: 6.6 % (ref 3.0–12.0)
Monocytes Absolute: 0.5 10*3/uL (ref 0.1–1.0)
Neutro Abs: 4.3 10*3/uL (ref 1.4–7.7)
Neutrophils Relative %: 59.7 % (ref 43.0–77.0)
Platelets: 359 10*3/uL (ref 150.0–400.0)
RBC: 5.26 Mil/uL — ABNORMAL HIGH (ref 3.87–5.11)
RDW: 13.9 % (ref 11.5–15.5)
WBC: 7.1 10*3/uL (ref 4.0–10.5)

## 2014-11-06 LAB — HEMOGLOBIN A1C: Hgb A1c MFr Bld: 5 % (ref 4.6–6.5)

## 2014-11-06 LAB — TSH: TSH: 2 u[IU]/mL (ref 0.35–4.50)

## 2014-11-06 LAB — VITAMIN B12: Vitamin B-12: 219 pg/mL (ref 211–911)

## 2014-11-06 MED ORDER — VENLAFAXINE HCL ER 150 MG PO CP24: 150.0000 mg | ORAL_CAPSULE | Freq: Every day | ORAL | Status: DC

## 2014-11-06 NOTE — Progress Notes (Signed)
Pre visit review using our clinic review tool, if applicable. No additional management support is needed unless otherwise documented below in the visit note. 

## 2014-11-06 NOTE — Patient Instructions (Signed)
Labs today.   Follow up in 3 months.  

## 2014-11-06 NOTE — Progress Notes (Signed)
Subjective:    Patient ID: Terri Andrews, female    DOB: 1981-02-21, 34 y.o.   MRN: [id number]  HPI  33YO female presents for follow up.  Last seen 5/24 for sore throat. Treated with Augmentin. Symptoms have resolved.  Feeling lightheaded at times over last few weeks.  Not every day. Last episode about 1-2 weeks ago. No syncope. Trying to increase fluid intake. NO other focal symptoms.  Psoriasis - Symptoms of rash have improved with Humira. No current symptoms noted from medication.  Wt Readings from Last 3 Encounters:  11/06/14 158 lb 4 oz (71.782 kg)  10/15/14 157 lb 12.8 oz (71.578 kg)  09/11/14 155 lb (70.308 kg)      Past medical, surgical, family and social history per today's encounter.  Review of Systems  Constitutional: Negative for fever, chills, appetite change, fatigue and unexpected weight change.  Eyes: Negative for visual disturbance.  Respiratory: Negative for shortness of breath.   Cardiovascular: Negative for chest pain and leg swelling.  Gastrointestinal: Negative for nausea, vomiting, abdominal pain, diarrhea and constipation.  Skin: Negative for color change and rash.  Neurological: Positive for dizziness and light-headedness. Negative for tremors, seizures, syncope, facial asymmetry, speech difficulty, weakness, numbness and headaches.  Hematological: Negative for adenopathy. Does not bruise/bleed easily.  Psychiatric/Behavioral: Negative for dysphoric mood. The patient is not nervous/anxious.        Objective:    BP 106/72 mmHg  Pulse 91  Temp(Src) 98 F (36.7 C) (Oral)  Ht 5\' 1"  (1.549 m)  Wt 158 lb 4 oz (71.782 kg)  BMI 29.92 kg/m2  SpO2 98%  LMP 10/28/2014 Physical Exam  Constitutional: She is oriented to person, place, and time. She appears well-developed and well-nourished. No distress.  HENT:  Head: Normocephalic and atraumatic.  Right Ear: External ear normal.  Left Ear: External ear normal.  Nose: Nose normal.  Mouth/Throat:  Oropharynx is clear and moist. No oropharyngeal exudate.  Eyes: Conjunctivae are normal. Pupils are equal, round, and reactive to light. Right eye exhibits no discharge. Left eye exhibits no discharge. No scleral icterus.  Neck: Normal range of motion. Neck supple. No tracheal deviation present. No thyromegaly present.  Cardiovascular: Normal rate, regular rhythm, normal heart sounds and intact distal pulses.  Exam reveals no gallop and no friction rub.   No murmur heard. Pulmonary/Chest: Effort normal and breath sounds normal. No respiratory distress. She has no wheezes. She has no rales. She exhibits no tenderness.  Musculoskeletal: Normal range of motion. She exhibits no edema or tenderness.  Lymphadenopathy:    She has no cervical adenopathy.  Neurological: She is alert and oriented to person, place, and time. No cranial nerve deficit. She exhibits normal muscle tone. Coordination normal.  Skin: Skin is warm and dry. No rash noted. She is not diaphoretic. No erythema. No pallor.  Psychiatric: She has a normal mood and affect. Her behavior is normal. Judgment and thought content normal.          Assessment & Plan:   Problem List Items Addressed This Visit      Unprioritized   Lightheaded - Primary    Recent episodes of lightheadedness. Improving some with increased hydration. Suspect mild dehydration as cause. Will check CMP, TSH, B12, CBC, A1c with labs.      Relevant Orders   CBC w/Diff   TSH   Comprehensive metabolic panel   B01   Hemoglobin A1c   Psoriasis    Significant improvement in skin symptoms on  Humira. Will continue.          Return in about 3 months (around 02/06/2015) for Recheck.

## 2014-11-06 NOTE — Assessment & Plan Note (Signed)
Significant improvement in skin symptoms on Humira. Will continue.

## 2014-11-06 NOTE — Assessment & Plan Note (Signed)
Recent episodes of lightheadedness. Improving some with increased hydration. Suspect mild dehydration as cause. Will check CMP, TSH, B12, CBC, A1c with labs.

## 2015-02-20 ENCOUNTER — Ambulatory Visit: Payer: BC Managed Care – PPO | Admitting: Internal Medicine

## 2015-02-24 ENCOUNTER — Encounter: Payer: Self-pay | Admitting: Internal Medicine

## 2015-02-24 ENCOUNTER — Ambulatory Visit (INDEPENDENT_AMBULATORY_CARE_PROVIDER_SITE_OTHER): Payer: BC Managed Care – PPO | Admitting: Internal Medicine

## 2015-02-24 VITALS — BP 109/77 | HR 100 | Temp 98.6°F | Ht 61.0 in | Wt 170.1 lb

## 2015-02-24 DIAGNOSIS — F419 Anxiety disorder, unspecified: Secondary | ICD-10-CM

## 2015-02-24 DIAGNOSIS — L409 Psoriasis, unspecified: Secondary | ICD-10-CM | POA: Diagnosis not present

## 2015-02-24 NOTE — Progress Notes (Signed)
Subjective:    Patient ID: Terri Andrews, female    DOB: 1980-11-15, 34 y.o.   MRN: [id number]  HPI  34YO female presents for follow up.  Last seen 10/2014 for lightheadedness. No further episodes of lightheadedness.  Started new job as Runner, broadcasting/film/video in [place]. Likes job. Anxiety well controlled on Effexor. Has headache if forgets to take medication.  Psoriasis well controlled with Humira.   Wt Readings from Last 3 Encounters:  02/24/15 170 lb 2 oz (77.168 kg)  11/06/14 158 lb 4 oz (71.782 kg)  10/15/14 157 lb 12.8 oz (71.578 kg)   BP Readings from Last 3 Encounters:  02/24/15 109/77  11/06/14 106/72  10/15/14 110/70    Past Medical History  Diagnosis Date  . Complete miscarriage   . Psoriasis    Family History  Problem Relation Age of Onset  . Rashes / Skin problems Mother   . Hyperlipidemia Father   . Rashes / Skin problems Father   . Rashes / Skin problems Sister    Past Surgical History  Procedure Laterality Date  . Knee surgery      cyst removal  . Cervical biopsy    . Uterine septum resection  04/2012    Dr. Samuella Cota at [facility]   Social History   Social History  . Marital Status: Married    Spouse Name: N/A  . Number of Children: N/A  . Years of Education: N/A   Social History Main Topics  . Smoking status: Never Smoker   . Smokeless tobacco: None  . Alcohol Use: No  . Drug Use: No  . Sexual Activity: Not Asked   Other Topics Concern  . None   Social History Narrative    Review of Systems  Constitutional: Negative for fever, chills, appetite change, fatigue and unexpected weight change.  Eyes: Negative for visual disturbance.  Respiratory: Negative for shortness of breath.   Cardiovascular: Negative for chest pain and leg swelling.  Gastrointestinal: Negative for nausea, vomiting, abdominal pain, diarrhea and constipation.  Musculoskeletal: Negative for myalgias and arthralgias.  Skin: Negative for color change and rash.  Hematological:  Negative for adenopathy. Does not bruise/bleed easily.  Psychiatric/Behavioral: Negative for sleep disturbance and dysphoric mood. The patient is not nervous/anxious.        Objective:    BP 109/77 mmHg  Pulse 100  Temp(Src) 98.6 F (37 C) (Oral)  Ht  (1.549 m)  Wt 170 lb 2 oz (77.168 kg)  BMI 32.16 kg/m2  SpO2 100%  LMP 02/04/2015 Physical Exam  Constitutional: She is oriented to person, place, and time. She appears well-developed and well-nourished. No distress.  HENT:  Head: Normocephalic and atraumatic.  Right Ear: External ear normal.  Left Ear: External ear normal.  Nose: Nose normal.  Mouth/Throat: Oropharynx is clear and moist. No oropharyngeal exudate.  Eyes: Conjunctivae are normal. Pupils are equal, round, and reactive to light. Right eye exhibits no discharge. Left eye exhibits no discharge. No scleral icterus.  Neck: Normal range of motion. Neck supple. No tracheal deviation present. No thyromegaly present.  Cardiovascular: Normal rate, regular rhythm, normal heart sounds and intact distal pulses.  Exam reveals no gallop and no friction rub.   No murmur heard. Pulmonary/Chest: Effort normal and breath sounds normal. No respiratory distress. She has no wheezes. She has no rales. She exhibits no tenderness.  Musculoskeletal: Normal range of motion. She exhibits no edema or tenderness.  Lymphadenopathy:    She has no cervical adenopathy.  Neurological: She is alert and oriented to person, place, and time. No cranial nerve deficit. She exhibits normal muscle tone. Coordination normal.  Skin: Skin is warm and dry. No rash noted. She is not diaphoretic. No erythema. No pallor.  Psychiatric: She has a normal mood and affect. Her behavior is normal. Judgment and thought content normal.          Assessment & Plan:   Problem List Items Addressed This Visit      Unprioritized   Anxiety - Primary    Symptoms well controlled. Continue Effexor.      Psoriasis     Symptoms well controlled with Humira. Encouraged flu vaccine. She declines.          Return in about 6 months (around 08/25/2015) for Physical.

## 2015-02-24 NOTE — Progress Notes (Signed)
Pre visit review using our clinic review tool, if applicable. No additional management support is needed unless otherwise documented below in the visit note. 

## 2015-02-24 NOTE — Patient Instructions (Signed)
Follow up for physical exam next year.

## 2015-02-24 NOTE — Assessment & Plan Note (Signed)
Symptoms well controlled. Continue Effexor.

## 2015-02-24 NOTE — Assessment & Plan Note (Signed)
Symptoms well controlled with Humira. Encouraged flu vaccine. She declines.

## 2015-03-24 ENCOUNTER — Ambulatory Visit (INDEPENDENT_AMBULATORY_CARE_PROVIDER_SITE_OTHER): Payer: BC Managed Care – PPO | Admitting: Family Medicine

## 2015-03-24 ENCOUNTER — Encounter: Payer: Self-pay | Admitting: Family Medicine

## 2015-03-24 DIAGNOSIS — J029 Acute pharyngitis, unspecified: Secondary | ICD-10-CM | POA: Diagnosis not present

## 2015-03-24 LAB — POCT RAPID STREP A (OFFICE): RAPID STREP A SCREEN: NEGATIVE

## 2015-03-24 MED ORDER — AMOXICILLIN 500 MG PO CAPS
500.0000 mg | ORAL_CAPSULE | Freq: Two times a day (BID) | ORAL | Status: DC
Start: 2015-03-24 — End: 2015-05-30

## 2015-03-24 NOTE — Patient Instructions (Signed)
Given the duration of your illness, I'm prescribing antibiotics.  Call if you worsen or fail to improve.  Take care  Dr. Adriana Simasook

## 2015-03-24 NOTE — Progress Notes (Signed)
Pre visit review using our clinic review tool, if applicable. No additional management support is needed unless otherwise documented below in the visit note. 

## 2015-03-24 NOTE — Progress Notes (Signed)
   Subjective:  Patient ID: Terri Andrews, female    DOB: 18-Dec-1980  Age: 34 y.o. MRN: [id number]  CC: Sore throat  HPI:  34 year old female presents to clinic today for an acute visit with complaints of sore throat.  Sore throat  Patient reports a two-week history of sore throat.  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  She denies any associated fevers or chills.  She does report "swollen glands" with tenderness.  No other associated symptoms.  No exacerbating or relieving factors.  Social Hx   Social History   Social History  . Marital Status: Married    Spouse Name: N/A  . Number of Children: N/A  . Years of Education: N/A   Social History Main Topics  . Smoking status: Never Smoker   . Smokeless tobacco: None  . Alcohol Use: No  . Drug Use: No  . Sexual Activity: Not Asked   Other Topics Concern  . None   Social History Narrative   Review of Systems  Constitutional: Negative for fever and chills.  HENT: Positive for sore throat.    Objective:  BP 110/74 mmHg  Pulse 93  Temp(Src) 98.9 F (37.2 C) (Oral)  Ht 5\' 1"  (1.549 m)  Wt 168 lb 2 oz (76.261 kg)  BMI 31.78 kg/m2  SpO2 97%  LMP 03/08/2015  BP/Weight 03/24/2015 02/24/2015 11/06/2014  Systolic BP 110 109 106  Diastolic BP 74 77 72  Wt. (Lbs) 168.13 170.13 158.25  BMI 31.78 32.16 29.92   Physical Exam  Constitutional: She appears well-developed. No distress.  HENT:  Head: Normocephalic.  Normal TMs bilaterally. Oropharynx with tonsillar enlargement and erythema. No exudate.  Neck:  Bilateral anterior cervical adenopathy with tenderness to palpation.  Cardiovascular: Normal rate and regular rhythm.   Pulmonary/Chest: Effort normal. No respiratory distress. She has no wheezes. She has no rales.  Abdominal: Soft. There is no tenderness.  Neurological: She is alert.  Psychiatric: She has a normal mood and affect.  Vitals  reviewed.  Assessment & Plan:   Problem List Items Addressed This Visit    Sore throat    Rapid strep negative. Given duration of illness, treating empirically with amoxicillin.      Relevant Medications   amoxicillin (AMOXIL) 500 MG capsule   Other Relevant Orders   POCT rapid strep A (Completed)      Meds ordered this encounter  Medications  . amoxicillin (AMOXIL) 500 MG capsule    Sig: Take 1 capsule (500 mg total) by mouth 2 (two) times daily.    Dispense:  20 capsule    Refill:  0   Follow-up: PRN  Everlene OtherJayce Judie Hollick, DO

## 2015-03-24 NOTE — Assessment & Plan Note (Signed)
Rapid strep negative. Given duration of illness, treating empirically with amoxicillin.

## 2015-05-23 ENCOUNTER — Ambulatory Visit (INDEPENDENT_AMBULATORY_CARE_PROVIDER_SITE_OTHER): Payer: BC Managed Care – PPO | Admitting: Family Medicine

## 2015-05-23 ENCOUNTER — Encounter: Payer: Self-pay | Admitting: Family Medicine

## 2015-05-23 DIAGNOSIS — N3001 Acute cystitis with hematuria: Secondary | ICD-10-CM | POA: Diagnosis not present

## 2015-05-23 LAB — POCT URINALYSIS DIPSTICK
Bilirubin, UA: NEGATIVE
Glucose, UA: NEGATIVE
Nitrite, UA: POSITIVE
PH UA: 6.5
Protein, UA: 100
SPEC GRAV UA: 1.025
Urobilinogen, UA: 1

## 2015-05-23 MED ORDER — CEPHALEXIN 500 MG PO CAPS
500.0000 mg | ORAL_CAPSULE | Freq: Two times a day (BID) | ORAL | Status: DC
Start: 2015-05-23 — End: 2015-05-30

## 2015-05-23 NOTE — Assessment & Plan Note (Signed)
New problem. History and urinalysis consistent with UTI. Sending for culture. Treating him. Lipid Keflex while awaiting culture.

## 2015-05-23 NOTE — Patient Instructions (Signed)
Take the antibiotic twice daily.  Call if you worsen or fail to improve.  Take care  Dr. Adriana Simasook   Urinary Tract Infection Urinary tract infections (UTIs) can develop anywhere along your urinary tract. Your urinary tract is your body's drainage system for removing wastes and extra water. Your urinary tract includes two kidneys, two ureters, a bladder, and a urethra. Your kidneys are a pair of bean-shaped organs. Each kidney is about the size of your fist. They are located below your ribs, one on each side of your spine. CAUSES Infections are caused by microbes, which are microscopic organisms, including fungi, viruses, and bacteria. These organisms are so small that they can only be seen through a microscope. Bacteria are the microbes that most commonly cause UTIs. SYMPTOMS  Symptoms of UTIs may vary by age and gender of the patient and by the location of the infection. Symptoms in young women typically include a frequent and intense urge to urinate and a painful, burning feeling in the bladder or urethra during urination. Older women and men are more likely to be tired, shaky, and weak and have muscle aches and abdominal pain. A fever may mean the infection is in your kidneys. Other symptoms of a kidney infection include pain in your back or sides below the ribs, nausea, and vomiting. DIAGNOSIS To diagnose a UTI, your caregiver will ask you about your symptoms. Your caregiver will also ask you to provide a urine sample. The urine sample will be tested for bacteria and white blood cells. White blood cells are made by your body to help fight infection. TREATMENT  Typically, UTIs can be treated with medication. Because most UTIs are caused by a bacterial infection, they usually can be treated with the use of antibiotics. The choice of antibiotic and length of treatment depend on your symptoms and the type of bacteria causing your infection. HOME CARE INSTRUCTIONS  If you were prescribed antibiotics,  take them exactly as your caregiver instructs you. Finish the medication even if you feel better after you have only taken some of the medication.  Drink enough water and fluids to keep your urine clear or pale yellow.  Avoid caffeine, tea, and carbonated beverages. They tend to irritate your bladder.  Empty your bladder often. Avoid holding urine for long periods of time.  Empty your bladder before and after sexual intercourse.  After a bowel movement, women should cleanse from front to back. Use each tissue only once. SEEK MEDICAL CARE IF:   You have back pain.  You develop a fever.  Your symptoms do not begin to resolve within 3 days. SEEK IMMEDIATE MEDICAL CARE IF:   You have severe back pain or lower abdominal pain.  You develop chills.  You have nausea or vomiting.  You have continued burning or discomfort with urination. MAKE SURE YOU:   Understand these instructions.  Will watch your condition.  Will get help right away if you are not doing well or get worse.   This information is not intended to replace advice given to you by your health care provider. Make sure you discuss any questions you have with your health care provider.   Document Released: 02/17/2005 Document Revised: 01/29/2015 Document Reviewed: 06/18/2011 Elsevier Interactive Patient Education [employer]2016 Elsevier Inc.

## 2015-05-23 NOTE — Progress Notes (Signed)
   Subjective:  Patient ID: Terri Andrews, female    DOB: [birth year]  Age: 34 y.o. MRN: [id number]  CC: Dysuria, frequency  HPI:  34 year old female presents to clinic today with complaints of dysuria and urinary frequency.  UTI  Patient reports that she developed burning with urination and urinary frequency yesterday.  She also reports cloudy and malodorous urine.  She reports gross hematuria as well.  No fevers or chills.  No nausea or vomiting.  She reports some lower abdominal discomfort and some low back pain  No exacerbating or relieving factors.  No interventions tried.  She is concerned that she may have urinary tract infection.  Social Hx   Social History   Social History  . Marital Status: Married    Spouse Name: N/A  . Number of Children: N/A  . Years of Education: N/A   Social History Main Topics  . Smoking status: Never Smoker   . Smokeless tobacco: None  . Alcohol Use: No  . Drug Use: No  . Sexual Activity: Not Asked   Other Topics Concern  . None   Social History Narrative   Review of Systems  Constitutional: Negative for fever and chills.  Gastrointestinal: Positive for abdominal pain.  Genitourinary: Positive for dysuria, urgency and frequency.  Musculoskeletal: Positive for back pain.    Objective:  BP 112/74 mmHg  Pulse 110  Temp(Src) 98 F (36.7 C) (Oral)  Ht 5\' 1"  (1.549 m)  Wt 172 lb 4 oz (78.132 kg)  BMI 32.56 kg/m2  SpO2 98%  BP/Weight 05/23/2015 03/24/2015 02/24/2015  Systolic BP 112 110 109  Diastolic BP 74 74 77  Wt. (Lbs) 172.25 168.13 170.13  BMI 32.56 31.78 32.16   Physical Exam  Constitutional: She appears well-developed. No distress.  Cardiovascular: Regular rhythm.  Tachycardia present.   Pulmonary/Chest: Effort normal and breath sounds normal. No respiratory distress. She has no wheezes. She has no rales.  Abdominal: Soft. She exhibits no distension. There is no tenderness.  Neurological: She is alert.    Psychiatric: She has a normal mood and affect.  Vitals reviewed.  Lab Results  Component Value Date   WBC 7.1 11/06/2014   HGB 14.0 11/06/2014   HCT 42.7 11/06/2014   PLT 359.0 11/06/2014   GLUCOSE 83 11/06/2014   ALT 10 11/06/2014   AST 15 11/06/2014   NA 138 11/06/2014   K 3.8 11/06/2014   CL 104 11/06/2014   CREATININE 0.67 11/06/2014   BUN 13 11/06/2014   CO2 29 11/06/2014   TSH 2.00 11/06/2014   HGBA1C 5.0 11/06/2014   Assessment & Plan:   Problem List Items Addressed This Visit    Acute cystitis with hematuria    New problem. History and urinalysis consistent with UTI. Sending for culture. Treating him. Lipid Keflex while awaiting culture.      Relevant Medications   cephALEXin (KEFLEX) 500 MG capsule   Other Relevant Orders   POCT Urinalysis Dipstick (Completed)   Urine culture      Meds ordered this encounter  Medications  . cephALEXin (KEFLEX) 500 MG capsule    Sig: Take 1 capsule (500 mg total) by mouth 2 (two) times daily.    Dispense:  10 capsule    Refill:  0    Follow-up: PRN  Terri OtherJayce Issaic Welliver DO [facility]

## 2015-05-23 NOTE — Progress Notes (Signed)
Pre visit review using our clinic review tool, if applicable. No additional management support is needed unless otherwise documented below in the visit note. 

## 2015-05-26 LAB — URINE CULTURE: Colony Count: >=100000

## 2015-05-29 ENCOUNTER — Telehealth: Payer: Self-pay | Admitting: *Deleted

## 2015-05-29 NOTE — Telephone Encounter (Signed)
Still having urgency and burning with urination, patient stated she completed ABX on 05/27/15 Keflex 500 mg, advised patient the ABX is still working but patient C/O symptoms are still persisting.

## 2015-05-29 NOTE — Telephone Encounter (Signed)
Patient was seen in the office on 12/30 for a bladder infection. She stated that she completed her antibiotic and the symptoms haven't subsided . Patient has requested another antibiotic. Please Advise

## 2015-05-29 NOTE — Telephone Encounter (Signed)
[facility] for a appointment.

## 2015-05-29 NOTE — Telephone Encounter (Signed)
Bacteria was susceptible. I suspect that symptoms are not from UTI. She will need to be seen.

## 2015-05-30 ENCOUNTER — Encounter: Payer: Self-pay | Admitting: Family Medicine

## 2015-05-30 ENCOUNTER — Ambulatory Visit (INDEPENDENT_AMBULATORY_CARE_PROVIDER_SITE_OTHER): Payer: BC Managed Care – PPO | Admitting: Family Medicine

## 2015-05-30 ENCOUNTER — Ambulatory Visit: Payer: BC Managed Care – PPO | Admitting: Family Medicine

## 2015-05-30 DIAGNOSIS — N3001 Acute cystitis with hematuria: Secondary | ICD-10-CM | POA: Diagnosis not present

## 2015-05-30 LAB — POCT URINALYSIS DIPSTICK
BILIRUBIN UA: NEGATIVE
Glucose, UA: NEGATIVE
Ketones, UA: NEGATIVE
NITRITE UA: POSITIVE
Protein, UA: 100
Spec Grav, UA: 1.025
UROBILINOGEN UA: 1
pH, UA: 7

## 2015-05-30 MED ORDER — CIPROFLOXACIN HCL 500 MG PO TABS: 500.0000 mg | ORAL_TABLET | Freq: Two times a day (BID) | ORAL | Status: DC

## 2015-05-30 NOTE — Progress Notes (Signed)
Subjective:  Patient ID: Terri Andrews, female    DOB: [birth year]  Age: 35 y.o. MRN: [id number]  CC: Continued UTI symptoms despite antibiotics  HPI:  35 year old female who was recently seen by me and diagnosed and treated for UTI presents with continued UTI symptoms.  UTI  Patient seen on 12/30 and diagnosed with UTI. Treated with Keflex.  Culture revealed E coli (Resistant to Ampicillin).  She has now completed a course of Keflex and states that she had some improvement with it but no resolution of her symptoms.  She continues to have dysuria, frequency.  No associated fevers or chills.  No other complaints this time.  Social Hx   Social History   Social History  . Marital Status: Married    Spouse Name: N/A  . Number of Children: N/A  . Years of Education: N/A   Social History Main Topics  . Smoking status: Never Smoker   . Smokeless tobacco: None  . Alcohol Use: No  . Drug Use: No  . Sexual Activity: Not Asked   Other Topics Concern  . None   Social History Narrative   Review of Systems  Constitutional: Negative for fever and chills.  Genitourinary: Positive for dysuria and frequency.    Objective:  BP 106/80 mmHg  Pulse 107  Temp(Src) 98.6 F (37 C) (Oral)  Ht 5\' 1"  (1.549 m)  Wt 171 lb 8 oz (77.792 kg)  BMI 32.42 kg/m2  SpO2 99%  BP/Weight 05/30/2015 05/23/2015 03/24/2015  Systolic BP 106 112 110  Diastolic BP 80 74 74  Wt. (Lbs) 171.5 172.25 168.13  BMI 32.42 32.56 31.78    Physical Exam  Constitutional: She appears well-developed. No distress.  Abdominal: Soft. She exhibits no distension. There is no tenderness.  Neurological: She is alert.  Psychiatric: She has a normal mood and affect.  Vitals reviewed.   Lab Results  Component Value Date   WBC 7.1 11/06/2014   HGB 14.0 11/06/2014   HCT 42.7 11/06/2014   PLT 359.0 11/06/2014   GLUCOSE 83 11/06/2014   ALT 10 11/06/2014   AST 15 11/06/2014   NA 138 11/06/2014   K 3.8  11/06/2014   CL 104 11/06/2014   CREATININE 0.67 11/06/2014   BUN 13 11/06/2014   CO2 29 11/06/2014   TSH 2.00 11/06/2014   HGBA1C 5.0 11/06/2014   Results for orders placed or performed in visit on 05/30/15 (from the past 24 hour(s))  POCT Urinalysis Dipstick     Status: Abnormal   Collection Time: 05/30/15  3:11 PM  Result Value Ref Range   Color, UA yellow    Clarity, UA cloudy    Glucose, UA neg    Bilirubin, UA neg    Ketones, UA neg    Spec Grav, UA 1.025    Blood, UA small    pH, UA 7.0    Protein, UA 100    Urobilinogen, UA 1.0    Nitrite, UA pos    Leukocytes, UA moderate (2+) (A) Negative    Assessment & Plan:   Problem List Items Addressed This Visit    Acute cystitis with hematuria    Continued symptoms despite treatment. Repeat UA today revealed small blood, moderate leukocytes and positive nitrite. Unclear of lack of response given sensitiviity on culture. Treating with Cipro.       Relevant Medications   ciprofloxacin (CIPRO) 500 MG tablet   Other Relevant Orders   POCT Urinalysis Dipstick (Completed)  Urine culture   Urine culture      Meds ordered this encounter  Medications  . ciprofloxacin (CIPRO) 500 MG tablet    Sig: Take 1 tablet (500 mg total) by mouth 2 (two) times daily.    Dispense:  10 tablet    Refill:  0    Follow-up: PRN  Everlene OtherJayce Reo Portela DO [facility] [place]

## 2015-05-30 NOTE — Telephone Encounter (Signed)
Patient was called and told there was a 415 pm appointment and she accepted it.

## 2015-05-30 NOTE — Assessment & Plan Note (Signed)
Continued symptoms despite treatment. Repeat UA today revealed small blood, moderate leukocytes and positive nitrite. Unclear of lack of response given sensitiviity on culture. Treating with Cipro.

## 2015-05-30 NOTE — Progress Notes (Signed)
Pre visit review using our clinic review tool, if applicable. No additional management support is needed unless otherwise documented below in the visit note. 

## 2015-06-02 LAB — URINE CULTURE: Colony Count: >=100000

## 2015-08-21 ENCOUNTER — Ambulatory Visit: Payer: BC Managed Care – PPO | Admitting: Family Medicine

## 2015-09-17 ENCOUNTER — Telehealth: Payer: Self-pay

## 2015-09-17 NOTE — Telephone Encounter (Signed)
Advised patient she would need to be seen but it was ok that we could fill her husbands form out.  Forms will be in hold box.

## 2015-09-17 NOTE — Telephone Encounter (Signed)
Pt called in and I scheduled her for 10/16/2015, pt also stated that her husband just was in on 08/08/2015. Does he still need to come in? Thank you!

## 2015-09-17 NOTE — Telephone Encounter (Signed)
Received foster care medical evaluation form for patient and her husband.  Neither have been seen in some time and Dr Dan HumphreysWalker wants them to make an appointment.  Tried to contact patient but no answer and unable to leave message.  Form is in hold box for now.

## 2015-09-22 ENCOUNTER — Other Ambulatory Visit (HOSPITAL_COMMUNITY)
Admission: RE | Admit: 2015-09-22 | Discharge: 2015-09-22 | Disposition: A | Discharge status: Home / Self Care | Payer: BC Managed Care – PPO | Source: Ambulatory Visit | Attending: Internal Medicine | Admitting: Internal Medicine

## 2015-09-22 ENCOUNTER — Ambulatory Visit (INDEPENDENT_AMBULATORY_CARE_PROVIDER_SITE_OTHER): Payer: BC Managed Care – PPO | Admitting: Internal Medicine

## 2015-09-22 ENCOUNTER — Encounter: Payer: Self-pay | Admitting: Internal Medicine

## 2015-09-22 VITALS — BP 116/82 | HR 94 | Ht 61.75 in | Wt 163.8 lb

## 2015-09-22 DIAGNOSIS — Z01419 Encounter for gynecological examination (general) (routine) without abnormal findings: Secondary | ICD-10-CM | POA: Diagnosis present

## 2015-09-22 DIAGNOSIS — F419 Anxiety disorder, unspecified: Secondary | ICD-10-CM

## 2015-09-22 DIAGNOSIS — Z1151 Encounter for screening for human papillomavirus (HPV): Secondary | ICD-10-CM | POA: Diagnosis present

## 2015-09-22 DIAGNOSIS — R7989 Other specified abnormal findings of blood chemistry: Secondary | ICD-10-CM

## 2015-09-22 DIAGNOSIS — Z0001 Encounter for general adult medical examination with abnormal findings: Secondary | ICD-10-CM

## 2015-09-22 DIAGNOSIS — Z0000 Encounter for general adult medical examination without abnormal findings: Secondary | ICD-10-CM | POA: Diagnosis not present

## 2015-09-22 MED ORDER — CLONAZEPAM 0.5 MG PO TABS: 0.5000 mg | ORAL_TABLET | Freq: Two times a day (BID) | ORAL | Status: DC | PRN

## 2015-09-22 NOTE — Patient Instructions (Signed)
Health Maintenance, Female Adopting a healthy lifestyle and getting preventive care can go a long way to promote health and wellness. Talk with your health care provider about what schedule of regular examinations is right for you. This is a good chance for you to check in with your provider about disease prevention and staying healthy. In between checkups, there are plenty of things you can do on your own. Experts have done a lot of research about which lifestyle changes and preventive measures are most likely to keep you healthy. Ask your health care provider for more information. WEIGHT AND DIET  Eat a healthy diet  Be sure to include plenty of vegetables, fruits, low-fat dairy products, and lean protein.  Do not eat a lot of foods high in solid fats, added sugars, or salt.  Get regular exercise. This is one of the most important things you can do for your health.  Most adults should exercise for at least 150 minutes each week. The exercise should increase your heart rate and make you sweat (moderate-intensity exercise).  Most adults should also do strengthening exercises at least twice a week. This is in addition to the moderate-intensity exercise.  Maintain a healthy weight  Body mass index (BMI) is a measurement that can be used to identify possible weight problems. It estimates body fat based on height and weight. Your health care provider can help determine your BMI and help you achieve or maintain a healthy weight.  For females 20 years of age and older:   A BMI below 18.5 is considered underweight.  A BMI of 18.5 to 24.9 is normal.  A BMI of 25 to 29.9 is considered overweight.  A BMI of 30 and above is considered obese.  Watch levels of cholesterol and blood lipids  You should start having your blood tested for lipids and cholesterol at 33 years of age, then have this test every 5 years.  You may need to have your cholesterol levels checked more often if:  Your lipid  or cholesterol levels are high.  You are older than 33 years of age.  You are at high risk for heart disease.  CANCER SCREENING   Lung Cancer  Lung cancer screening is recommended for adults 55-80 years old who are at high risk for lung cancer because of a history of smoking.  A yearly low-dose CT scan of the lungs is recommended for people who:  Currently smoke.  Have quit within the past 15 years.  Have at least a 30-pack-year history of smoking. A pack year is smoking an average of one pack of cigarettes a day for 1 year.  Yearly screening should continue until it has been 15 years since you quit.  Yearly screening should stop if you develop a health problem that would prevent you from having lung cancer treatment.  Breast Cancer  Practice breast self-awareness. This means understanding how your breasts normally appear and feel.  It also means doing regular breast self-exams. Let your health care provider know about any changes, no matter how small.  If you are in your 20s or 30s, you should have a clinical breast exam (CBE) by a health care provider every 1-3 years as part of a regular health exam.  If you are 40 or older, have a CBE every year. Also consider having a breast X-ray (mammogram) every year.  If you have a family history of breast cancer, talk to your health care provider about genetic screening.  If you   are at high risk for breast cancer, talk to your health care provider about having an MRI and a mammogram every year.  Breast cancer gene (BRCA) assessment is recommended for women who have family members with BRCA-related cancers. BRCA-related cancers include:  Breast.  Ovarian.  Tubal.  Peritoneal cancers.  Results of the assessment will determine the need for genetic counseling and BRCA1 and BRCA2 testing. Cervical Cancer Your health care provider may recommend that you be screened regularly for cancer of the pelvic organs (ovaries, uterus, and  vagina). This screening involves a pelvic examination, including checking for microscopic changes to the surface of your cervix (Pap test). You may be encouraged to have this screening done every 3 years, beginning at age 21.  For women ages 30-65, health care providers may recommend pelvic exams and Pap testing every 3 years, or they may recommend the Pap and pelvic exam, combined with testing for human papilloma virus (HPV), every 5 years. Some types of HPV increase your risk of cervical cancer. Testing for HPV may also be done on women of any age with unclear Pap test results.  Other health care providers may not recommend any screening for nonpregnant women who are considered low risk for pelvic cancer and who do not have symptoms. Ask your health care provider if a screening pelvic exam is right for you.  If you have had past treatment for cervical cancer or a condition that could lead to cancer, you need Pap tests and screening for cancer for at least 20 years after your treatment. If Pap tests have been discontinued, your risk factors (such as having a new sexual partner) need to be reassessed to determine if screening should resume. Some women have medical problems that increase the chance of getting cervical cancer. In these cases, your health care provider may recommend more frequent screening and Pap tests. Colorectal Cancer  This type of cancer can be detected and often prevented.  Routine colorectal cancer screening usually begins at 33 years of age and continues through 33 years of age.  Your health care provider may recommend screening at an earlier age if you have risk factors for colon cancer.  Your health care provider may also recommend using home test kits to check for hidden blood in the stool.  A small camera at the end of a tube can be used to examine your colon directly (sigmoidoscopy or colonoscopy). This is done to check for the earliest forms of colorectal  cancer.  Routine screening usually begins at age 50.  Direct examination of the colon should be repeated every 5-10 years through 33 years of age. However, you may need to be screened more often if early forms of precancerous polyps or small growths are found. Skin Cancer  Check your skin from head to toe regularly.  Tell your health care provider about any new moles or changes in moles, especially if there is a change in a mole's shape or color.  Also tell your health care provider if you have a mole that is larger than the size of a pencil eraser.  Always use sunscreen. Apply sunscreen liberally and repeatedly throughout the day.  Protect yourself by wearing long sleeves, pants, a wide-brimmed hat, and sunglasses whenever you are outside. HEART DISEASE, DIABETES, AND HIGH BLOOD PRESSURE   High blood pressure causes heart disease and increases the risk of stroke. High blood pressure is more likely to develop in:  People who have blood pressure in the high end   of the normal range (130-139/85-89 mm Hg).  People who are overweight or obese.  People who are African American.  If you are 38-23 years of age, have your blood pressure checked every 3-5 years. If you are 61 years of age or older, have your blood pressure checked every year. You should have your blood pressure measured twice--once when you are at a hospital or clinic, and once when you are not at a hospital or clinic. Record the average of the two measurements. To check your blood pressure when you are not at a hospital or clinic, you can use:  An automated blood pressure machine at a pharmacy.  A home blood pressure monitor.  If you are between 45 years and 39 years old, ask your health care provider if you should take aspirin to prevent strokes.  Have regular diabetes screenings. This involves taking a blood sample to check your fasting blood sugar level.  If you are at a normal weight and have a low risk for diabetes,  have this test once every three years after 33 years of age.  If you are overweight and have a high risk for diabetes, consider being tested at a younger age or more often. PREVENTING INFECTION  Hepatitis B  If you have a higher risk for hepatitis B, you should be screened for this virus. You are considered at high risk for hepatitis B if:  You were born in a country where hepatitis B is common. Ask your health care provider which countries are considered high risk.  Your parents were born in a high-risk country, and you have not been immunized against hepatitis B (hepatitis B vaccine).  You have HIV or AIDS.  You use needles to inject street drugs.  You live with someone who has hepatitis B.  You have had sex with someone who has hepatitis B.  You get hemodialysis treatment.  You take certain medicines for conditions, including cancer, organ transplantation, and autoimmune conditions. Hepatitis C  Blood testing is recommended for:  Everyone born from [birth year] through 1963.  Anyone with known risk factors for hepatitis C. Sexually transmitted infections (STIs)  You should be screened for sexually transmitted infections (STIs) including gonorrhea and chlamydia if:  You are sexually active and are younger than 33 years of age.  You are older than 33 years of age and your health care provider tells you that you are at risk for this type of infection.  Your sexual activity has changed since you were last screened and you are at an increased risk for chlamydia or gonorrhea. Ask your health care provider if you are at risk.  If you do not have HIV, but are at risk, it may be recommended that you take a prescription medicine daily to prevent HIV infection. This is called pre-exposure prophylaxis (PrEP). You are considered at risk if:  You are sexually active and do not regularly use condoms or know the HIV status of your partner(s).  You take drugs by injection.  You are sexually  active with a partner who has HIV. Talk with your health care provider about whether you are at high risk of being infected with HIV. If you choose to begin PrEP, you should first be tested for HIV. You should then be tested every 3 months for as long as you are taking PrEP.  PREGNANCY   If you are premenopausal and you may become pregnant, ask your health care provider about preconception counseling.  If you may  become pregnant, take 400 to 800 micrograms (mcg) of folic acid every day.  If you want to prevent pregnancy, talk to your health care provider about birth control (contraception). OSTEOPOROSIS AND MENOPAUSE   Osteoporosis is a disease in which the bones lose minerals and strength with aging. This can result in serious bone fractures. Your risk for osteoporosis can be identified using a bone density scan.  If you are 61 years of age or older, or if you are at risk for osteoporosis and fractures, ask your health care provider if you should be screened.  Ask your health care provider whether you should take a calcium or vitamin D supplement to lower your risk for osteoporosis.  Menopause may have certain physical symptoms and risks.  Hormone replacement therapy may reduce some of these symptoms and risks. Talk to your health care provider about whether hormone replacement therapy is right for you.  HOME CARE INSTRUCTIONS   Schedule regular health, dental, and eye exams.  Stay current with your immunizations.   Do not use any tobacco products including cigarettes, chewing tobacco, or electronic cigarettes.  If you are pregnant, do not drink alcohol.  If you are breastfeeding, limit how much and how often you drink alcohol.  Limit alcohol intake to no more than 1 drink per day for nonpregnant women. One drink equals 12 ounces of beer, 5 ounces of wine, or 1 ounces of hard liquor.  Do not use street drugs.  Do not share needles.  Ask your health care provider for help if  you need support or information about quitting drugs.  Tell your health care provider if you often feel depressed.  Tell your health care provider if you have ever been abused or do not feel safe at home.   This information is not intended to replace advice given to you by your health care provider. Make sure you discuss any questions you have with your health care provider.   Document Released: 11/23/2010 Document Revised: 05/31/2014 Document Reviewed: 04/11/2013 Elsevier Interactive Patient Education [employer].

## 2015-09-22 NOTE — Assessment & Plan Note (Signed)
General medical exam normal today including breast and pelvic exam. PAP pending. Labs today. Encouraged healthy diet and exercise. Immunizations UTD.

## 2015-09-22 NOTE — Assessment & Plan Note (Signed)
Worsening anxiety. Will start Clonazepam prn for episodes of severe anxiety. Discussed potential risks of this medication. Continue Venlafaxine. Follow up in 4 weeks and prn.

## 2015-09-22 NOTE — Progress Notes (Signed)
Subjective:    Patient ID: Terri Andrews, female    DOB: 02-03-1981, 35 y.o.   MRN: [id number]  HPI  34YO female presents for physical exam.  Having more anxiety recently. Would like to add something for use as needed. Compliant with Effexor. Episodes of anxiety occur about every 2 weeks. Has taken Alprazolam in the past with some drowsiness.   Wt Readings from Last 3 Encounters:  09/22/15 163 lb 12.8 oz (74.299 kg)  05/30/15 171 lb 8 oz (77.792 kg)  05/23/15 172 lb 4 oz (78.132 kg)   BP Readings from Last 3 Encounters:  09/22/15 116/82  05/30/15 106/80  05/23/15 112/74    Past Medical History  Diagnosis Date  . Complete miscarriage   . Psoriasis    Family History  Problem Relation Age of Onset  . Rashes / Skin problems Mother   . Hyperlipidemia Father   . Rashes / Skin problems Father   . Rashes / Skin problems Sister    Past Surgical History  Procedure Laterality Date  . Knee surgery      cyst removal  . Cervical biopsy    . Uterine septum resection  04/2012    Dr. Samuella Cota at [facility]   Social History   Social History  . Marital Status: Married    Spouse Name: N/A  . Number of Children: N/A  . Years of Education: N/A   Social History Main Topics  . Smoking status: Never Smoker   . Smokeless tobacco: Never Used  . Alcohol Use: No  . Drug Use: No  . Sexual Activity:    Partners: Male   Other Topics Concern  . None   Social History Narrative    Review of Systems  Constitutional: Negative for fever, chills, appetite change, fatigue and unexpected weight change.  Eyes: Negative for visual disturbance.  Respiratory: Negative for cough and shortness of breath.   Cardiovascular: Negative for chest pain and leg swelling.  Gastrointestinal: Negative for nausea, vomiting, abdominal pain, diarrhea and constipation.  Musculoskeletal: Negative for myalgias and arthralgias.  Skin: Negative for color change and rash.  Neurological: Negative for weakness.    Hematological: Negative for adenopathy. Does not bruise/bleed easily.  Psychiatric/Behavioral: Negative for suicidal ideas, sleep disturbance and dysphoric mood. The patient is nervous/anxious.        Objective:    BP 116/82 mmHg  Pulse 94  Ht 5' 1.75" (1.568 m)  Wt 163 lb 12.8 oz (74.299 kg)  BMI 30.22 kg/m2  SpO2 98%  LMP 09/06/2015 Physical Exam  Constitutional: She is oriented to person, place, and time. She appears well-developed and well-nourished. No distress.  HENT:  Head: Normocephalic and atraumatic.  Right Ear: External ear normal.  Left Ear: External ear normal.  Nose: Nose normal.  Mouth/Throat: Oropharynx is clear and moist. No oropharyngeal exudate.  Eyes: Conjunctivae are normal. Pupils are equal, round, and reactive to light. Right eye exhibits no discharge. Left eye exhibits no discharge. No scleral icterus.  Neck: Normal range of motion. Neck supple. No tracheal deviation present. No thyromegaly present.  Cardiovascular: Normal rate, regular rhythm, normal heart sounds and intact distal pulses.  Exam reveals no gallop and no friction rub.   No murmur heard. Pulmonary/Chest: Effort normal and breath sounds normal. No respiratory distress. She has no wheezes. She has no rales. She exhibits no tenderness.  Abdominal: Soft. Bowel sounds are normal. She exhibits no distension and no mass. There is no tenderness. There is no rebound and  no guarding.  Genitourinary: Rectum normal, vagina normal and uterus normal. No breast swelling, tenderness, discharge or bleeding. Pelvic exam was performed with patient supine. There is no rash, tenderness or lesion on the right labia. There is no rash, tenderness or lesion on the left labia. Uterus is not enlarged and not tender. Cervix exhibits no motion tenderness, no discharge and no friability. Right adnexum displays no mass, no tenderness and no fullness. Left adnexum displays no mass, no tenderness and no fullness. No erythema or  tenderness in the vagina. No vaginal discharge found.  Musculoskeletal: Normal range of motion. She exhibits no edema or tenderness.  Lymphadenopathy:    She has no cervical adenopathy.  Neurological: She is alert and oriented to person, place, and time. No cranial nerve deficit. She exhibits normal muscle tone. Coordination normal.  Skin: Skin is warm and dry. No rash noted. She is not diaphoretic. No erythema. No pallor.  Psychiatric: She has a normal mood and affect. Her behavior is normal. Judgment and thought content normal.          Assessment & Plan:   Problem List Items Addressed This Visit      Unprioritized   Anxiety    Worsening anxiety. Will start Clonazepam prn for episodes of severe anxiety. Discussed potential risks of this medication. Continue Venlafaxine. Follow up in 4 weeks and prn.      Routine general medical examination at a health care facility - Primary    General medical exam normal today including breast and pelvic exam. PAP pending. Labs today. Encouraged healthy diet and exercise. Immunizations UTD.      Relevant Orders   Cytology - PAP   TSH   Comprehensive metabolic panel   Lipid panel   CBC with Differential/Platelet       Return in about 4 weeks (around 10/20/2015) for Recheck.  Ronna PolioJennifer Dayona Shaheen, MD Internal Medicine [facility]

## 2015-09-23 LAB — CBC WITH DIFFERENTIAL/PLATELET
BASOS ABS: 0 10*3/uL (ref 0.0–0.1)
Basophils Relative: 0.4 % (ref 0.0–3.0)
EOS ABS: 0.2 10*3/uL (ref 0.0–0.7)
Eosinophils Relative: 2.2 % (ref 0.0–5.0)
HEMATOCRIT: 39.7 % (ref 36.0–46.0)
HEMOGLOBIN: 13.1 g/dL (ref 12.0–15.0)
LYMPHS PCT: 28.8 % (ref 12.0–46.0)
Lymphs Abs: 2.9 10*3/uL (ref 0.7–4.0)
MCHC: 33 g/dL (ref 30.0–36.0)
MCV: 81.4 fl (ref 78.0–100.0)
Monocytes Absolute: 0.8 10*3/uL (ref 0.1–1.0)
Monocytes Relative: 8 % (ref 3.0–12.0)
Neutro Abs: 6.1 10*3/uL (ref 1.4–7.7)
Neutrophils Relative %: 60.6 % (ref 43.0–77.0)
Platelets: 334 10*3/uL (ref 150.0–400.0)
RBC: 4.87 Mil/uL (ref 3.87–5.11)
RDW: 13.6 % (ref 11.5–15.5)
WBC: 10.1 10*3/uL (ref 4.0–10.5)

## 2015-09-23 LAB — LIPID PANEL
CHOL/HDL RATIO: 4
CHOLESTEROL: 178 mg/dL (ref 0–200)
HDL: 45.2 mg/dL (ref >39.00)
NONHDL: 132.51
TRIGLYCERIDES: 228 mg/dL — AB (ref 0.0–149.0)
VLDL: 45.6 mg/dL — AB (ref 0.0–40.0)

## 2015-09-23 LAB — COMPREHENSIVE METABOLIC PANEL
ALBUMIN: 4.2 g/dL (ref 3.5–5.2)
ALT: 11 U/L (ref 0–35)
AST: 15 U/L (ref 0–37)
Alkaline Phosphatase: 55 U/L (ref 39–117)
BUN: 13 mg/dL (ref 6–23)
CALCIUM: 9.6 mg/dL (ref 8.4–10.5)
CHLORIDE: 103 meq/L (ref 96–112)
CO2: 29 mEq/L (ref 19–32)
CREATININE: 0.73 mg/dL (ref 0.40–1.20)
GFR: 97.75 mL/min (ref >60.00)
Glucose, Bld: 77 mg/dL (ref 70–99)
POTASSIUM: 4 meq/L (ref 3.5–5.1)
Sodium: 138 mEq/L (ref 135–145)
Total Bilirubin: 0.3 mg/dL (ref 0.2–1.2)
Total Protein: 7.3 g/dL (ref 6.0–8.3)

## 2015-09-23 LAB — TSH: TSH: 2.35 u[IU]/mL (ref 0.35–4.50)

## 2015-09-23 LAB — LDL CHOLESTEROL, DIRECT: Direct LDL: 112 mg/dL

## 2015-09-25 LAB — CYTOLOGY - PAP

## 2015-10-01 ENCOUNTER — Encounter: Payer: Self-pay | Admitting: Internal Medicine

## 2015-10-16 ENCOUNTER — Encounter: Payer: BC Managed Care – PPO | Admitting: Internal Medicine

## 2015-10-22 ENCOUNTER — Encounter: Payer: Self-pay | Admitting: Internal Medicine

## 2015-10-22 ENCOUNTER — Ambulatory Visit (INDEPENDENT_AMBULATORY_CARE_PROVIDER_SITE_OTHER): Payer: BC Managed Care – PPO | Admitting: Internal Medicine

## 2015-10-22 VITALS — BP 132/90 | HR 82 | Ht 61.75 in | Wt 164.0 lb

## 2015-10-22 DIAGNOSIS — F419 Anxiety disorder, unspecified: Secondary | ICD-10-CM

## 2015-10-22 DIAGNOSIS — L409 Psoriasis, unspecified: Secondary | ICD-10-CM

## 2015-10-22 DIAGNOSIS — H01006 Unspecified blepharitis left eye, unspecified eyelid: Secondary | ICD-10-CM | POA: Insufficient documentation

## 2015-10-22 MED ORDER — POLYMYXIN B-TRIMETHOPRIM 10000-0.1 UNIT/ML-% OP SOLN: 1.0000 [drp] | OPHTHALMIC | Status: DC

## 2015-10-22 NOTE — Assessment & Plan Note (Signed)
Symptoms slightly worsened after stopping Humira. Evaluation with new dermatologist pending for tomorrow. Will follow.

## 2015-10-22 NOTE — Assessment & Plan Note (Signed)
Symptoms well controlled with Effexor and prn Clonazepam. Will continue.

## 2015-10-22 NOTE — Progress Notes (Signed)
Subjective:    Patient ID: Terri Andrews, female    DOB: 1980/08/01, 35 y.o.   MRN: [id number]  HPI  34YO female presents for follow up.  Recently seen for physical exam and noted to have worsening anxiety. Started on Clonazepam for episodes of panic/anxiety.  Over the last month, has used Clonazepam on 3 occasions. No drowsiness noted with medication. Some improvement with anxiety. Continues on Effexor as well.  Psoriasis - Seeing a new dermatologist at [facility]. Overdue for Humira. Notes some worsening of psoriasis.  Last few days, having some redness and irritation of left lower eyelid. No drainage. No visual changes. Not taking anything for this.  Wt Readings from Last 3 Encounters:  10/22/15 164 lb (74.39 kg)  09/22/15 163 lb 12.8 oz (74.299 kg)  05/30/15 171 lb 8 oz (77.792 kg)   BP Readings from Last 3 Encounters:  10/22/15 132/90  09/22/15 116/82  05/30/15 106/80    Past Medical History  Diagnosis Date  . Complete miscarriage   . Psoriasis    Family History  Problem Relation Age of Onset  . Rashes / Skin problems Mother   . Hyperlipidemia Father   . Rashes / Skin problems Father   . Rashes / Skin problems Sister    Past Surgical History  Procedure Laterality Date  . Knee surgery      cyst removal  . Cervical biopsy    . Uterine septum resection  04/2012    Dr. Samuella Cota at [facility]   Social History   Social History  . Marital Status: Married    Spouse Name: N/A  . Number of Children: N/A  . Years of Education: N/A   Social History Main Topics  . Smoking status: Never Smoker   . Smokeless tobacco: Never Used  . Alcohol Use: No  . Drug Use: No  . Sexual Activity:    Partners: Male   Other Topics Concern  . None   Social History Narrative    Review of Systems  Constitutional: Negative for fever, chills, appetite change, fatigue and unexpected weight change.  Eyes: Positive for pain (left lower eyelid). Negative for discharge,  redness, itching and visual disturbance.  Respiratory: Negative for shortness of breath.   Cardiovascular: Negative for chest pain, palpitations and leg swelling.  Gastrointestinal: Negative for abdominal pain.  Skin: Negative for color change and rash.  Hematological: Negative for adenopathy. Does not bruise/bleed easily.  Psychiatric/Behavioral: Negative for suicidal ideas, sleep disturbance and dysphoric mood. The patient is not nervous/anxious.        Objective:    BP 132/90 mmHg  Pulse 82  Ht 5' 1.75" (1.568 m)  Wt 164 lb (74.39 kg)  BMI 30.26 kg/m2  SpO2 99%  LMP 10/11/2015 Physical Exam  Constitutional: She is oriented to person, place, and time. She appears well-developed and well-nourished. No distress.  HENT:  Head: Normocephalic and atraumatic.  Right Ear: External ear normal.  Left Ear: External ear normal.  Nose: Nose normal.  Mouth/Throat: Oropharynx is clear and moist. No oropharyngeal exudate.  Eyes: Conjunctivae are normal. Pupils are equal, round, and reactive to light. Right eye exhibits no discharge. Left eye exhibits no discharge. No scleral icterus.    Neck: Normal range of motion. Neck supple. No tracheal deviation present. No thyromegaly present.  Cardiovascular: Normal rate, regular rhythm, normal heart sounds and intact distal pulses.  Exam reveals no gallop and no friction rub.   No murmur heard. Pulmonary/Chest: Effort normal and breath  sounds normal. No respiratory distress. She has no wheezes. She has no rales. She exhibits no tenderness.  Musculoskeletal: Normal range of motion. She exhibits no edema or tenderness.  Lymphadenopathy:    She has no cervical adenopathy.  Neurological: She is alert and oriented to person, place, and time. No cranial nerve deficit. She exhibits normal muscle tone. Coordination normal.  Skin: Skin is warm and dry. No rash noted. She is not diaphoretic. No erythema. No pallor.  Psychiatric: She has a normal mood and  affect. Her behavior is normal. Judgment and thought content normal.          Assessment & Plan:   Problem List Items Addressed This Visit      Unprioritized   Anxiety - Primary    Symptoms well controlled with Effexor and prn Clonazepam. Will continue.      Blepharitis of left eye    Symptoms and exam consistent with blepharitis. Encouraged her to remove eye makeup, use warm compresses as needed. Will start Polytrim. Follow up if symptoms are not improving.      Psoriasis    Symptoms slightly worsened after stopping Humira. Evaluation with new dermatologist pending for tomorrow. Will follow.          Return in about 6 months (around 04/22/2016) for Recheck.  Ronna PolioJennifer Walker, MD Internal Medicine [facility]

## 2015-10-22 NOTE — Progress Notes (Signed)
Pre-visit discussion using our clinic review tool. No additional management support is needed unless otherwise documented below in the visit note.  

## 2015-10-22 NOTE — Patient Instructions (Signed)
Start Polytrim drops to left eye. Use warm compresses a few times per day.  Follow up if no improvement.

## 2015-10-22 NOTE — Assessment & Plan Note (Signed)
Symptoms and exam consistent with blepharitis. Encouraged her to remove eye makeup, use warm compresses as needed. Will start Polytrim. Follow up if symptoms are not improving.

## 2015-11-22 ENCOUNTER — Other Ambulatory Visit: Payer: Self-pay | Admitting: Internal Medicine

## 2015-12-12 ENCOUNTER — Encounter: Payer: Self-pay | Admitting: Family Medicine

## 2015-12-12 ENCOUNTER — Ambulatory Visit (INDEPENDENT_AMBULATORY_CARE_PROVIDER_SITE_OTHER): Payer: BC Managed Care – PPO | Admitting: Family Medicine

## 2015-12-12 DIAGNOSIS — N3001 Acute cystitis with hematuria: Secondary | ICD-10-CM

## 2015-12-12 DIAGNOSIS — N390 Urinary tract infection, site not specified: Secondary | ICD-10-CM | POA: Insufficient documentation

## 2015-12-12 LAB — POCT URINALYSIS DIPSTICK
Bilirubin, UA: NEGATIVE
GLUCOSE UA: NEGATIVE
Ketones, UA: NEGATIVE
Nitrite, UA: NEGATIVE
PH UA: 7
Protein, UA: NEGATIVE
Spec Grav, UA: 1.015
UROBILINOGEN UA: 1

## 2015-12-12 MED ORDER — CIPROFLOXACIN HCL 250 MG PO TABS: 250.0000 mg | ORAL_TABLET | Freq: Two times a day (BID) | ORAL | Status: DC

## 2015-12-12 NOTE — Progress Notes (Signed)
Pre visit review using our clinic review tool, if applicable. No additional management support is needed unless otherwise documented below in the visit note. 

## 2015-12-12 NOTE — Patient Instructions (Signed)
Take the medication as prescribed.  Call or email with concerns.  Good luck on the 1/2 marathon.  Take care  Dr. Adriana Simasook

## 2015-12-12 NOTE — Progress Notes (Signed)
Subjective:  Patient ID: Bebe ShaggyRachel C Pickerill, female    DOB: 1981/03/20  Age: 35 y.o. MRN: [id number]  CC: ? UTI  HPI:  35 year old female with history of UTI presents with concerns for UTI.  Patient reports that she had some blood with wiping after voiding yesterday. This has persisted. She reports associated frequency. No urgency. No associated fevers or chills. No dysuria. No reports of flank pain or abdominal pain. No known exacerbating or relieving factors. No other complaints at this time.  Social Hx   Social History   Social History  . Marital Status: Married    Spouse Name: N/A  . Number of Children: N/A  . Years of Education: N/A   Social History Main Topics  . Smoking status: Never Smoker   . Smokeless tobacco: Never Used  . Alcohol Use: No  . Drug Use: No  . Sexual Activity:    Partners: Male   Other Topics Concern  . None   Social History Narrative   Review of Systems  Constitutional: Negative.   Genitourinary: Positive for frequency and hematuria. Negative for dysuria, urgency and flank pain.   Objective:  BP 124/82 mmHg  Pulse 75  Temp(Src) 97.6 F (36.4 C) (Oral)  Wt 167 lb 4 oz (75.864 kg)  SpO2 99%  BP/Weight 12/12/2015 10/22/2015 09/22/2015  Systolic BP 124 132 116  Diastolic BP 82 90 82  Wt. (Lbs) 167.25 164 163.8  BMI 30.86 30.26 30.22   Physical Exam  Constitutional: She is oriented to person, place, and time. She appears well-developed. No distress.  Cardiovascular: Normal rate and regular rhythm.   Pulmonary/Chest: Effort normal. She has no wheezes. She has no rales.  Abdominal: Soft. She exhibits no distension. There is no tenderness. There is no rebound and no guarding.  Neurological: She is alert and oriented to person, place, and time.  Psychiatric: She has a normal mood and affect.  Vitals reviewed.  Lab Results  Component Value Date   WBC 10.1 09/22/2015   HGB 13.1 09/22/2015   HCT 39.7 09/22/2015   PLT 334.0 09/22/2015   GLUCOSE 77 09/22/2015   CHOL 178 09/22/2015   TRIG 228.0* 09/22/2015   HDL 45.20 09/22/2015   LDLDIRECT 112.0 09/22/2015   ALT 11 09/22/2015   AST 15 09/22/2015   NA 138 09/22/2015   K 4.0 09/22/2015   CL 103 09/22/2015   CREATININE 0.73 09/22/2015   BUN 13 09/22/2015   CO2 29 09/22/2015   TSH 2.35 09/22/2015   HGBA1C 5.0 11/06/2014   Results for orders placed or performed in visit on 12/12/15 (from the past 24 hour(s))  POCT Urinalysis Dipstick     Status: Abnormal   Collection Time: 12/12/15 11:24 AM  Result Value Ref Range   Color, UA Yellow    Clarity, UA Clear    Glucose, UA Negative    Bilirubin, UA Negative    Ketones, UA Negative    Spec Grav, UA 1.015    Blood, UA Trace-intact    pH, UA 7.0    Protein, UA Negative    Urobilinogen, UA 1.0    Nitrite, UA Negative    Leukocytes, UA Trace (A) Negative   Assessment & Plan:   Problem List Items Addressed This Visit    UTI (urinary tract infection)    Patient with a history of UTI. Presenting with concern for UTI. Urinalysis with trace blood and leukocytes. Treating empirically with Cipro while awaiting culture.      Relevant  Medications   ciprofloxacin (CIPRO) 250 MG tablet   Other Relevant Orders   POCT Urinalysis Dipstick (Completed)   Urine culture      Meds ordered this encounter  Medications  . ciprofloxacin (CIPRO) 250 MG tablet    Sig: Take 1 tablet (250 mg total) by mouth 2 (two) times daily.    Dispense:  10 tablet    Refill:  0   Follow-up: PRN  Everlene Other DO [facility]

## 2015-12-12 NOTE — Assessment & Plan Note (Signed)
Patient with a history of UTI. Presenting with concern for UTI. Urinalysis with trace blood and leukocytes. Treating empirically with Cipro while awaiting culture.

## 2015-12-13 LAB — URINE CULTURE: Organism ID, Bacteria: 10000

## 2015-12-19 ENCOUNTER — Other Ambulatory Visit: Payer: Self-pay | Admitting: Internal Medicine

## 2016-01-27 ENCOUNTER — Other Ambulatory Visit: Payer: Self-pay | Admitting: Surgical

## 2016-01-27 MED ORDER — VENLAFAXINE HCL ER 150 MG PO CP24: ORAL_CAPSULE | ORAL | 0 refills | Status: DC

## 2016-02-23 ENCOUNTER — Other Ambulatory Visit: Payer: Self-pay | Admitting: Family Medicine

## 2016-02-23 ENCOUNTER — Ambulatory Visit (INDEPENDENT_AMBULATORY_CARE_PROVIDER_SITE_OTHER): Payer: BC Managed Care – PPO | Admitting: Family Medicine

## 2016-02-23 ENCOUNTER — Encounter: Payer: Self-pay | Admitting: Family Medicine

## 2016-02-23 DIAGNOSIS — F419 Anxiety disorder, unspecified: Secondary | ICD-10-CM | POA: Diagnosis not present

## 2016-02-23 DIAGNOSIS — K5900 Constipation, unspecified: Secondary | ICD-10-CM | POA: Diagnosis not present

## 2016-02-23 MED ORDER — VENLAFAXINE HCL ER 150 MG PO CP24: ORAL_CAPSULE | ORAL | 1 refills | Status: DC

## 2016-02-23 MED ORDER — CLONAZEPAM 0.5 MG PO TABS: 0.5000 mg | ORAL_TABLET | Freq: Two times a day (BID) | ORAL | 1 refills | Status: DC | PRN

## 2016-02-23 MED ORDER — LINACLOTIDE 145 MCG PO CAPS: 145.0000 ug | ORAL_CAPSULE | Freq: Every day | ORAL | 0 refills | Status: DC

## 2016-02-23 NOTE — Progress Notes (Signed)
Subjective:  Patient ID: Terri Andrews, female    DOB: Aug 14, 1980  Age: 35 y.o. MRN: [id number]  CC: Follow up, medication refill, constipation  HPI:  35 year old female with Anxiety and psoriasis presents for follow-up/medical history refill. She also complains of constipation.  Anxiety  Patient reports that her anxiety is stable. She is doing well at this time.  She is in need of refill on her Effexor as well as her Klonopin.  Constipation  Patient reports that she's been battling constipation for the past 3 years.  She states that she has a bowel movement approximately every 1-2 weeks.  She reports that it is associated with abdominal cramping and pain. She states that she has associated diaphoresis.  She states that the stool is fairly easy to pass but occurs infrequently.  She reports that she's used milk of magnesia, MiraLAX, and stool softeners without significant improvement.  No reports of hematochezia or melena.  She would like to discuss treatment options today.  Social Hx   Social History   Social History  . Marital status: Married    Spouse name: N/A  . Number of children: N/A  . Years of education: N/A   Social History Main Topics  . Smoking status: Never Smoker  . Smokeless tobacco: Never Used  . Alcohol use No  . Drug use: No  . Sexual activity: Yes    Partners: Male   Other Topics Concern  . None   Social History Narrative  . None   Review of Systems  Constitutional: Negative.   Gastrointestinal: Positive for constipation.   Objective:  BP 128/82 (BP Location: Right Arm, Patient Position: Sitting, Cuff Size: Normal)   Pulse 98   Temp 98.2 F (36.8 C) (Oral)   Wt 166 lb 6 oz (75.5 kg)   SpO2 98%   BMI 30.68 kg/m   BP/Weight 02/23/2016 12/12/2015 10/22/2015  Systolic BP 128 124 132  Diastolic BP 82 82 90  Wt. (Lbs) 166.38 167.25 164  BMI 30.68 30.86 30.26   Physical Exam  Constitutional: She is oriented to person, place, and  time. She appears well-developed. No distress.  Cardiovascular: Normal rate and regular rhythm.   Pulmonary/Chest: Effort normal. She has no wheezes. She has no rales.  Abdominal: Soft. She exhibits no distension. There is no tenderness. There is no rebound and no guarding.  Neurological: She is alert and oriented to person, place, and time.  Psychiatric: She has a normal mood and affect.  Vitals reviewed.  Lab Results  Component Value Date   WBC 10.1 09/22/2015   HGB 13.1 09/22/2015   HCT 39.7 09/22/2015   PLT 334.0 09/22/2015   GLUCOSE 77 09/22/2015   CHOL 178 09/22/2015   TRIG 228.0 (H) 09/22/2015   HDL 45.20 09/22/2015   LDLDIRECT 112.0 09/22/2015   ALT 11 09/22/2015   AST 15 09/22/2015   NA 138 09/22/2015   K 4.0 09/22/2015   CL 103 09/22/2015   CREATININE 0.73 09/22/2015   BUN 13 09/22/2015   CO2 29 09/22/2015   TSH 2.35 09/22/2015   HGBA1C 5.0 11/06/2014    Assessment & Plan:   Problem List Items Addressed This Visit    Anxiety    Stable. Klonopin and Effexor refilled today.      Relevant Medications   venlafaxine XR (EFFEXOR-XR) 150 MG 24 hr capsule   Constipation    New problem (to me; this has never been addressed before). Patient has tried several over-the-counter  treatments without improvement. Trial of Linzess. Samples given today.         Other Visit Diagnoses   None.     Meds ordered this encounter  Medications  . clonazePAM (KLONOPIN) 0.5 MG tablet    Sig: Take 1 tablet (0.5 mg total) by mouth 2 (two) times daily as needed for anxiety.    Dispense:  20 tablet    Refill:  1  . venlafaxine XR (EFFEXOR-XR) 150 MG 24 hr capsule    Sig: TAKE ONE CAPSULE BY MOUTH DAILY WITH BREAKFAST    Dispense:  90 capsule    Refill:  1  . linaclotide (LINZESS) 145 MCG CAPS capsule    Sig: Take 1 capsule (145 mcg total) by mouth daily before breakfast.    Dispense:  16 capsule    Refill:  0    Follow-up: 6 months to 1 year.  Everlene OtherJayce Tammara Massing DO [facility] [place]

## 2016-02-23 NOTE — Assessment & Plan Note (Signed)
New problem (to me; this has never been addressed before). Patient has tried several over-the-counter treatments without improvement. Trial of Linzess. Samples given today.

## 2016-02-23 NOTE — Patient Instructions (Signed)
It was nice to see you.  Use miralax as we discussed. If no improvement try the Linzess.  Follow up in 6 months to 1 year.  Take care  Dr. Adriana Simasook

## 2016-02-23 NOTE — Progress Notes (Signed)
Pre visit review using our clinic review tool, if applicable. No additional management support is needed unless otherwise documented below in the visit note. 

## 2016-02-23 NOTE — Assessment & Plan Note (Signed)
Stable. Klonopin and Effexor refilled today.

## 2016-02-24 ENCOUNTER — Other Ambulatory Visit: Payer: Self-pay | Admitting: Family Medicine

## 2016-08-21 ENCOUNTER — Other Ambulatory Visit: Payer: Self-pay | Admitting: Family Medicine

## 2016-09-09 ENCOUNTER — Ambulatory Visit: Payer: BC Managed Care – PPO | Admitting: Family Medicine

## 2016-09-10 ENCOUNTER — Encounter: Payer: Self-pay | Admitting: Family Medicine

## 2016-09-10 ENCOUNTER — Other Ambulatory Visit: Payer: Self-pay | Admitting: Family Medicine

## 2016-09-10 ENCOUNTER — Telehealth: Payer: Self-pay | Admitting: *Deleted

## 2016-09-10 ENCOUNTER — Ambulatory Visit (INDEPENDENT_AMBULATORY_CARE_PROVIDER_SITE_OTHER): Payer: BC Managed Care – PPO | Admitting: Family Medicine

## 2016-09-10 VITALS — BP 134/88 | HR 103 | Temp 98.0°F | Wt 165.1 lb

## 2016-09-10 DIAGNOSIS — R3989 Other symptoms and signs involving the genitourinary system: Secondary | ICD-10-CM | POA: Diagnosis not present

## 2016-09-10 MED ORDER — VALACYCLOVIR HCL 1 G PO TABS: 1000.0000 mg | ORAL_TABLET | Freq: Two times a day (BID) | ORAL | 0 refills | Status: DC

## 2016-09-10 MED ORDER — CLONAZEPAM 0.5 MG PO TABS: 0.5000 mg | ORAL_TABLET | Freq: Two times a day (BID) | ORAL | 1 refills | Status: DC | PRN

## 2016-09-10 NOTE — Patient Instructions (Signed)
Medications as prescribed. We will call with your results.  Take care  Dr. Adriana Simas

## 2016-09-10 NOTE — Addendum Note (Signed)
Addended by: Warden Fillers on: 09/10/2016 03:28 PM   Modules accepted: Orders

## 2016-09-10 NOTE — Progress Notes (Signed)
Pre visit review using our clinic review tool, if applicable. No additional management support is needed unless otherwise documented below in the visit note. 

## 2016-09-10 NOTE — Telephone Encounter (Signed)
Amy from Cytology requested a new order for HSV be changed to herpes culture.  Contact Amy from Cytology [phone]

## 2016-09-10 NOTE — Assessment & Plan Note (Signed)
New problem. 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 Swabbed today. After discussion, patient elected to start treatment while awaiting the results. Starting Valtrex.

## 2016-09-10 NOTE — Telephone Encounter (Signed)
Please change for me.

## 2016-09-10 NOTE — Progress Notes (Signed)
Subjective:  Patient ID: Terri Andrews, female    DOB: [birth year]  Age: 36 y.o. MRN: [id number]  CC: Bumps - Vagina  HPI:  36 year old female presents with the above complaint.  Patient reports 2 to 3 day history of "bumps" on her vagina. They are itchy. She states that they look different than it did before. She states that they looked like pustules or blisters. Not painful. No associated vaginal discharge. No other associated symptoms. She states that she recently changed soaps and has been doing hot yoga. Additionally, she informs me that she had unprotected sexual intercourse 1 month ago with another sexual partner. She is concerned about this. No other complaints or issues at this time.  Social Hx   Social History   Social History  . Marital status: Married    Spouse name: N/A  . Number of children: N/A  . Years of education: N/A   Social History Main Topics  . Smoking status: Never Smoker  . Smokeless tobacco: Never Used  . Alcohol use No  . Drug use: No  . Sexual activity: Yes    Partners: Male   Other Topics Concern  . None   Social History Narrative  . None    Review of Systems  Constitutional: Negative.   Genitourinary: Positive for genital sores.   Objective:  BP 134/88   Pulse (!) 103   Temp 98 F (36.7 C) (Oral)   Wt 165 lb 2 oz (74.9 kg)   SpO2 98%   BMI 30.45 kg/m   BP/Weight 09/10/2016 02/23/2016 12/12/2015  Systolic BP 134 128 124  Diastolic BP 88 82 82  Wt. (Lbs) 165.13 166.38 167.25  BMI 30.45 30.68 30.86   Physical Exam  Constitutional: She is oriented to person, place, and time. She appears well-developed. No distress.  Pulmonary/Chest: Effort normal.  Genitourinary:  Genitourinary Comments: 3 small lesions noted on the right labia. Do not appear vesicular at this time. No fluctuance/drainage. No surrounding erythema.   Neurological: She is alert and oriented to person, place, and time.  Psychiatric: She has a normal mood and affect.    Vitals reviewed.   Lab Results  Component Value Date   WBC 10.1 09/22/2015   HGB 13.1 09/22/2015   HCT 39.7 09/22/2015   PLT 334.0 09/22/2015   GLUCOSE 77 09/22/2015   CHOL 178 09/22/2015   TRIG 228.0 (H) 09/22/2015   HDL 45.20 09/22/2015   LDLDIRECT 112.0 09/22/2015   ALT 11 09/22/2015   AST 15 09/22/2015   NA 138 09/22/2015   K 4.0 09/22/2015   CL 103 09/22/2015   CREATININE 0.73 09/22/2015   BUN 13 09/22/2015   CO2 29 09/22/2015   TSH 2.35 09/22/2015   HGBA1C 5.0 11/06/2014    Assessment & Plan:   Problem List Items Addressed This Visit    Genital sore - Primary    New problem. 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 Swabbed today. After discussion, patient elected to start treatment while awaiting the results. Starting Valtrex.      Relevant Orders   HSV 1,2 PCR (ACCUSWAB)      Meds ordered this encounter  Medications  . clonazePAM (KLONOPIN) 0.5 MG tablet    Sig: Take 1 tablet (0.5 mg total) by mouth 2 (two) times daily as needed for anxiety.    Dispense:  20 tablet    Refill:  1  . valACYclovir (VALTREX) 1000 MG tablet    Sig: Take 1 tablet (1,000 mg total) by  mouth 2 (two) times daily.    Dispense:  20 tablet    Refill:  0   Follow-up: PRN  Everlene Other DO [facility]

## 2016-09-13 LAB — HSV CULTURE AND TYPING

## 2016-12-07 ENCOUNTER — Telehealth: Payer: Self-pay | Admitting: Family Medicine

## 2016-12-07 ENCOUNTER — Ambulatory Visit: Payer: BC Managed Care – PPO | Admitting: Family Medicine

## 2016-12-07 DIAGNOSIS — Z0289 Encounter for other administrative examinations: Secondary | ICD-10-CM

## 2016-12-07 NOTE — Telephone Encounter (Signed)
FyI - Pt called and cancelled appt, states that she is feeling better.

## 2016-12-07 NOTE — Telephone Encounter (Signed)
fyi

## 2016-12-14 ENCOUNTER — Encounter: Payer: Self-pay | Admitting: Family Medicine

## 2016-12-14 ENCOUNTER — Ambulatory Visit (INDEPENDENT_AMBULATORY_CARE_PROVIDER_SITE_OTHER): Payer: BC Managed Care – PPO | Admitting: Family Medicine

## 2016-12-14 DIAGNOSIS — Z0000 Encounter for general adult medical examination without abnormal findings: Secondary | ICD-10-CM | POA: Diagnosis not present

## 2016-12-14 NOTE — Patient Instructions (Signed)
Continue your meds.  Follow up annually.  Take care  Dr. Lacinda Axon    Health Maintenance, Female Adopting a healthy lifestyle and getting preventive care can go a long way to promote health and wellness. Talk with your health care provider about what schedule of regular examinations is right for you. This is a good chance for you to check in with your provider about disease prevention and staying healthy. In between checkups, there are plenty of things you can do on your own. Experts have done a lot of research about which lifestyle changes and preventive measures are most likely to keep you healthy. Ask your health care provider for more information. Weight and diet Eat a healthy diet  Be sure to include plenty of vegetables, fruits, low-fat dairy products, and lean protein.  Do not eat a lot of foods high in solid fats, added sugars, or salt.  Get regular exercise. This is one of the most important things you can do for your health. ? Most adults should exercise for at least 150 minutes each week. The exercise should increase your heart rate and make you sweat (moderate-intensity exercise). ? Most adults should also do strengthening exercises at least twice a week. This is in addition to the moderate-intensity exercise.  Maintain a healthy weight  Body mass index (BMI) is a measurement that can be used to identify possible weight problems. It estimates body fat based on height and weight. Your health care provider can help determine your BMI and help you achieve or maintain a healthy weight.  For females 68 years of age and older: ? A BMI below 18.5 is considered underweight. ? A BMI of 18.5 to 24.9 is normal. ? A BMI of 25 to 29.9 is considered overweight. ? A BMI of 30 and above is considered obese.  Watch levels of cholesterol and blood lipids  You should start having your blood tested for lipids and cholesterol at 34 years of age, then have this test every 5 years.  You may need  to have your cholesterol levels checked more often if: ? Your lipid or cholesterol levels are high. ? You are older than 34 years of age. ? You are at high risk for heart disease.  Cancer screening Lung Cancer  Lung cancer screening is recommended for adults 84-64 years old who are at high risk for lung cancer because of a history of smoking.  A yearly low-dose CT scan of the lungs is recommended for people who: ? Currently smoke. ? Have quit within the past 15 years. ? Have at least a 30-pack-year history of smoking. A pack year is smoking an average of one pack of cigarettes a day for 1 year.  Yearly screening should continue until it has been 15 years since you quit.  Yearly screening should stop if you develop a health problem that would prevent you from having lung cancer treatment.  Breast Cancer  Practice breast self-awareness. This means understanding how your breasts normally appear and feel.  It also means doing regular breast self-exams. Let your health care provider know about any changes, no matter how small.  If you are in your 20s or 30s, you should have a clinical breast exam (CBE) by a health care provider every 1-3 years as part of a regular health exam.  If you are 9 or older, have a CBE every year. Also consider having a breast X-ray (mammogram) every year.  If you have a family history of breast cancer,  talk to your health care provider about genetic screening.  If you are at high risk for breast cancer, talk to your health care provider about having an MRI and a mammogram every year.  Breast cancer gene (BRCA) assessment is recommended for women who have family members with BRCA-related cancers. BRCA-related cancers include: ? Breast. ? Ovarian. ? Tubal. ? Peritoneal cancers.  Results of the assessment will determine the need for genetic counseling and BRCA1 and BRCA2 testing.  Cervical Cancer Your health care provider may recommend that you be  screened regularly for cancer of the pelvic organs (ovaries, uterus, and vagina). This screening involves a pelvic examination, including checking for microscopic changes to the surface of your cervix (Pap test). You may be encouraged to have this screening done every 3 years, beginning at age 21.  For women ages 29-65, health care providers may recommend pelvic exams and Pap testing every 3 years, or they may recommend the Pap and pelvic exam, combined with testing for human papilloma virus (HPV), every 5 years. Some types of HPV increase your risk of cervical cancer. Testing for HPV may also be done on women of any age with unclear Pap test results.  Other health care providers may not recommend any screening for nonpregnant women who are considered low risk for pelvic cancer and who do not have symptoms. Ask your health care provider if a screening pelvic exam is right for you.  If you have had past treatment for cervical cancer or a condition that could lead to cancer, you need Pap tests and screening for cancer for at least 20 years after your treatment. If Pap tests have been discontinued, your risk factors (such as having a new sexual partner) need to be reassessed to determine if screening should resume. Some women have medical problems that increase the chance of getting cervical cancer. In these cases, your health care provider may recommend more frequent screening and Pap tests.  Colorectal Cancer  This type of cancer can be detected and often prevented.  Routine colorectal cancer screening usually begins at 34 years of age and continues through 34 years of age.  Your health care provider may recommend screening at an earlier age if you have risk factors for colon cancer.  Your health care provider may also recommend using home test kits to check for hidden blood in the stool.  A small camera at the end of a tube can be used to examine your colon directly (sigmoidoscopy or colonoscopy).  This is done to check for the earliest forms of colorectal cancer.  Routine screening usually begins at age 92.  Direct examination of the colon should be repeated every 5-10 years through 34 years of age. However, you may need to be screened more often if early forms of precancerous polyps or small growths are found.  Skin Cancer  Check your skin from head to toe regularly.  Tell your health care provider about any new moles or changes in moles, especially if there is a change in a mole's shape or color.  Also tell your health care provider if you have a mole that is larger than the size of a pencil eraser.  Always use sunscreen. Apply sunscreen liberally and repeatedly throughout the day.  Protect yourself by wearing long sleeves, pants, a wide-brimmed hat, and sunglasses whenever you are outside.  Heart disease, diabetes, and high blood pressure  High blood pressure causes heart disease and increases the risk of stroke. High blood pressure is  more likely to develop in: ? People who have blood pressure in the high end of the normal range (130-139/85-89 mm Hg). ? People who are overweight or obese. ? People who are African American.  If you are 47-80 years of age, have your blood pressure checked every 3-5 years. If you are 94 years of age or older, have your blood pressure checked every year. You should have your blood pressure measured twice-once when you are at a hospital or clinic, and once when you are not at a hospital or clinic. Record the average of the two measurements. To check your blood pressure when you are not at a hospital or clinic, you can use: ? An automated blood pressure machine at a pharmacy. ? A home blood pressure monitor.  If you are between 34 years and 10 years old, ask your health care provider if you should take aspirin to prevent strokes.  Have regular diabetes screenings. This involves taking a blood sample to check your fasting blood sugar level. ? If  you are at a normal weight and have a low risk for diabetes, have this test once every three years after 34 years of age. ? If you are overweight and have a high risk for diabetes, consider being tested at a younger age or more often. Preventing infection Hepatitis B  If you have a higher risk for hepatitis B, you should be screened for this virus. You are considered at high risk for hepatitis B if: ? You were born in a country where hepatitis B is common. Ask your health care provider which countries are considered high risk. ? Your parents were born in a high-risk country, and you have not been immunized against hepatitis B (hepatitis B vaccine). ? You have HIV or AIDS. ? You use needles to inject street drugs. ? You live with someone who has hepatitis B. ? You have had sex with someone who has hepatitis B. ? You get hemodialysis treatment. ? You take certain medicines for conditions, including cancer, organ transplantation, and autoimmune conditions.  Hepatitis C  Blood testing is recommended for: ? Everyone born from [birth year] through 1963. ? Anyone with known risk factors for hepatitis C.  Sexually transmitted infections (STIs)  You should be screened for sexually transmitted infections (STIs) including gonorrhea and chlamydia if: ? You are sexually active and are younger than 34 years of age. ? You are older than 34 years of age and your health care provider tells you that you are at risk for this type of infection. ? Your sexual activity has changed since you were last screened and you are at an increased risk for chlamydia or gonorrhea. Ask your health care provider if you are at risk.  If you do not have HIV, but are at risk, it may be recommended that you take a prescription medicine daily to prevent HIV infection. This is called pre-exposure prophylaxis (PrEP). You are considered at risk if: ? You are sexually active and do not regularly use condoms or know the HIV status of your  partner(s). ? You take drugs by injection. ? You are sexually active with a partner who has HIV.  Talk with your health care provider about whether you are at high risk of being infected with HIV. If you choose to begin PrEP, you should first be tested for HIV. You should then be tested every 3 months for as long as you are taking PrEP. Pregnancy  If you are premenopausal and you  may become pregnant, ask your health care provider about preconception counseling.  If you may become pregnant, take 400 to 800 micrograms (mcg) of folic acid every day.  If you want to prevent pregnancy, talk to your health care provider about birth control (contraception). Osteoporosis and menopause  Osteoporosis is a disease in which the bones lose minerals and strength with aging. This can result in serious bone fractures. Your risk for osteoporosis can be identified using a bone density scan.  If you are 59 years of age or older, or if you are at risk for osteoporosis and fractures, ask your health care provider if you should be screened.  Ask your health care provider whether you should take a calcium or vitamin D supplement to lower your risk for osteoporosis.  Menopause may have certain physical symptoms and risks.  Hormone replacement therapy may reduce some of these symptoms and risks. Talk to your health care provider about whether hormone replacement therapy is right for you. Follow these instructions at home:  Schedule regular health, dental, and eye exams.  Stay current with your immunizations.  Do not use any tobacco products including cigarettes, chewing tobacco, or electronic cigarettes.  If you are pregnant, do not drink alcohol.  If you are breastfeeding, limit how much and how often you drink alcohol.  Limit alcohol intake to no more than 1 drink per day for nonpregnant women. One drink equals 12 ounces of beer, 5 ounces of wine, or 1 ounces of hard liquor.  Do not use street  drugs.  Do not share needles.  Ask your health care provider for help if you need support or information about quitting drugs.  Tell your health care provider if you often feel depressed.  Tell your health care provider if you have ever been abused or do not feel safe at home. This information is not intended to replace advice given to you by your health care provider. Make sure you discuss any questions you have with your health care provider. Document Released: 11/23/2010 Document Revised: 10/16/2015 Document Reviewed: 02/11/2015 Elsevier Interactive Patient Education  [employer].

## 2016-12-15 DIAGNOSIS — Z0000 Encounter for general adult medical examination without abnormal findings: Secondary | ICD-10-CM | POA: Insufficient documentation

## 2016-12-15 NOTE — Progress Notes (Signed)
   Subjective:  Patient ID: Terri Andrews, female    DOB: 15-Aug-1980  Age: 36 y.o. MRN: [id number]  CC: Annual exam  HPI Terri ShaggyRachel C Thole is a 36 y.o. female presents to the clinic today for an annual exam. She needs a physical prior to her new job.  Preventative Healthcare  Pap smear: Up to date.  Mammogram: Not indicated.   Colonoscopy: Not indicated.  Immunizations  Tetanus - Up to date.  Labs: Declines.  Alcohol use: No.  Smoking/tobacco use: No.  PMH, Surgical Hx, Family Hx, Social History reviewed and updated as below.  Past Medical History:  Diagnosis Date  . Complete miscarriage   . Psoriasis    Past Surgical History:  Procedure Laterality Date  . CERVICAL BIOPSY    . KNEE SURGERY     cyst removal  . UTERINE SEPTUM RESECTION  04/2012   Dr[facility]Price at [facility]   Family History  Problem Relation Age of Onset  . Rashes / Skin problems Mother   . Hyperlipidemia Father   . Rashes / Skin problems Father   . Rashes / Skin problems Sister    Social History  Substance Use Topics  . Smoking status: Never Smoker  . Smokeless tobacco: Never Used  . Alcohol use No    Review of Systems  Psychiatric/Behavioral:       Anxiety/stress.  All other systems reviewed and are negative.  Objective:   Today's Vitals: BP 110/76 (BP Location: Left Arm, Patient Position: Sitting, Cuff Size: Normal)   Pulse (!) 110   Temp 99.1 F (37.3 C) (Oral)   Wt 164 lb 4 oz (74.5 kg)   LMP 12/13/2016   SpO2 99%   BMI 30.29 kg/m   Physical Exam  Constitutional: She is oriented to person, place, and time. She appears well-developed and well-nourished. No distress.  HENT:  Head: Normocephalic and atraumatic.  Nose: Nose normal.  Mouth/Throat: Oropharynx is clear and moist. No oropharyngeal exudate.  Normal TM's bilaterally.   Eyes: Conjunctivae are normal. No scleral icterus.  Neck: Neck supple.  Cardiovascular: Normal rate and regular rhythm.   No murmur  heard. Pulmonary/Chest: Effort normal and breath sounds normal. She has no wheezes. She has no rales.  Abdominal: Soft. She exhibits no distension. There is no tenderness. There is no rebound and no guarding.  Musculoskeletal: Normal range of motion. She exhibits no edema.  Lymphadenopathy:    She has no cervical adenopathy.  Neurological: She is alert and oriented to person, place, and time.  Skin: Skin is warm and dry. No rash noted.  Psychiatric: She has a normal mood and affect.  Vitals reviewed.  Assessment & Plan:   Problem List Items Addressed This Visit      Other   Annual physical exam    Preventative healthcare up-to-date. Form filled out for new job.        Follow-up: PRN  Everlene OtherJayce Brant Peets DO [facility] [place]

## 2016-12-15 NOTE — Assessment & Plan Note (Signed)
Preventative healthcare up-to-date. Form filled out for new job.

## 2017-03-22 ENCOUNTER — Other Ambulatory Visit: Payer: Self-pay | Admitting: Family Medicine

## 2017-04-18 ENCOUNTER — Encounter: Payer: Self-pay | Admitting: Family Medicine

## 2017-04-18 ENCOUNTER — Ambulatory Visit: Payer: BC Managed Care – PPO | Admitting: Family Medicine

## 2017-04-18 ENCOUNTER — Other Ambulatory Visit: Payer: Self-pay

## 2017-04-18 VITALS — BP 110/88 | HR 84 | Temp 98.0°F | Wt 162.6 lb

## 2017-04-18 DIAGNOSIS — N3001 Acute cystitis with hematuria: Secondary | ICD-10-CM | POA: Diagnosis not present

## 2017-04-18 DIAGNOSIS — R350 Frequency of micturition: Secondary | ICD-10-CM

## 2017-04-18 LAB — POCT URINALYSIS DIPSTICK
Bilirubin, UA: NEGATIVE
Blood, UA: POSITIVE
GLUCOSE UA: NEGATIVE
Ketones, UA: NEGATIVE
Nitrite, UA: NEGATIVE
Protein, UA: NEGATIVE
Spec Grav, UA: >=1.03 — AB (ref 1.010–1.025)
UROBILINOGEN UA: 2 U/dL — AB
pH, UA: 6 (ref 5.0–8.0)

## 2017-04-18 MED ORDER — NITROFURANTOIN MONOHYD MACRO 100 MG PO CAPS: 100.0000 mg | ORAL_CAPSULE | Freq: Two times a day (BID) | ORAL | 0 refills | Status: AC

## 2017-04-18 NOTE — Addendum Note (Signed)
Addended by: Inetta FermoHENDRICKS, Myron Lona S on: 04/18/2017 04:31 PM   Modules accepted: Orders

## 2017-04-18 NOTE — Patient Instructions (Signed)
Please take medication as directed.  You can also take over the counter AZO for symptom relief.  Follow up if symptoms do not improve with treatment, worsen, or you develop a fever, nausea, vomiting, or back pain.   Urinary Tract Infection, Adult A urinary tract infection (UTI) is an infection of any part of the urinary tract. The urinary tract includes the:  Kidneys.  Ureters.  Bladder.  Urethra.  These organs make, store, and get rid of pee (urine) in the body. Follow these instructions at home:  Take over-the-counter and prescription medicines only as told by your doctor.  If you were prescribed an antibiotic medicine, take it as told by your doctor. Do not stop taking the antibiotic even if you start to feel better.  Avoid the following drinks: ? Alcohol. ? Caffeine. ? Tea. ? Carbonated drinks.  Drink enough fluid to keep your pee clear or pale yellow.  Keep all follow-up visits as told by your doctor. This is important.  Make sure to: ? Empty your bladder often and completely. Do not to hold pee for long periods of time. ? Empty your bladder before and after sex. ? Wipe from front to back after a bowel movement if you are female. Use each tissue one time when you wipe. Contact a doctor if:  You have back pain.  You have a fever.  You feel sick to your stomach (nauseous).  You throw up (vomit).  Your symptoms do not get better after 3 days.  Your symptoms go away and then come back. Get help right away if:  You have very bad back pain.  You have very bad lower belly (abdominal) pain.  You are throwing up and cannot keep down any medicines or water. This information is not intended to replace advice given to you by your health care provider. Make sure you discuss any questions you have with your health care provider. Document Released: 10/27/2007 Document Revised: 10/16/2015 Document Reviewed: 03/31/2015 Elsevier Interactive Patient Education  [employer]2018  Elsevier Inc.

## 2017-04-18 NOTE — Progress Notes (Signed)
Patient ID: Terri Andrews, female   DOB: January 12, 1981, 36 y.o.   MRN: [id number]    PCP: Tommie Samsook, Jayce G, DO  Subjective:  Terri ShaggyRachel C Liera is a 36 y.o. year old very pleasant female patient who presents with Urinary Tract symptoms: symptoms including dysuria, requency, nocturia, urgency, and foul smelling urine -started: 2 to 3 weeks ago, symptoms are not improving -previous treatments: AZO has provided limited benefit -  ROS-denies fever, chills, sweats, N/V, flank pain, or blood in urine  Pertinent Past Medical History- prior cystitis in 2017; psoriasis  Medications- reviewed  Current Outpatient Medications  Medication Sig Dispense Refill  . clonazePAM (KLONOPIN) 0.5 MG tablet Take 1 tablet (0.5 mg total) by mouth 2 (two) times daily as needed for anxiety. 20 tablet 1  . HUMIRA PEN 40 MG/0.8ML PNKT     . venlafaxine XR (EFFEXOR-XR) 150 MG 24 hr capsule TAKE 1 CAPSULE BY MOUTH DAILY WITH BREAKFAST 90 capsule 0   No current facility-administered medications for this visit.     Objective: BP 110/88   Pulse (!) 103   Temp 98 F (36.7 C) (Oral)   Wt 162 lb 9.6 oz (73.8 kg)   LMP 04/10/2017 (Approximate)   SpO2 99%   BMI 29.98 kg/m  Gen: NAD, resting comfortably HEENT: Turbinates erythematous, oropharynx clear and moist CV: RRR no murmurs rubs or gallops Lungs: CTAB no crackles, wheeze, rhonchi Abdomen: soft/nontender/nondistended/normal bowel sounds. No rebound or guarding.  No CVA tenderness.   Suprapubic tenderness noted Ext: no edema Skin: warm, dry, no rash Neuro: grossly normal, moves all extremities  Assessment/Plan:  1. Acute cystitis with hematuria UA is positive for leukocytes and blood. Will treat empirically while awaiting culture.  - nitrofurantoin, macrocrystal-monohydrate, (MACROBID) 100 MG capsule; Take 1 capsule (100 mg total) by mouth 2 (two) times daily for 5 days.  Dispense: 10 capsule; Refill: 0  2. Urinary frequency Azo for symptom relief. - POCT  Urinalysis Dipstick   Advised patient to complete antibiotic and she should follow up if her symptoms do not improve in 2 to 3 days, worsen, she develops a fever >101, or back pain. Also advised her to force fluids and she can use OTC Azo for symptom relief temporarily if needed. Patient voiced understanding and agreed with plan.    Finally, we reviewed reasons to return to care including if symptoms worsen or persist or new concerns arise- once again particularly fever, N/V, or flank pain.    Inez CatalinaJulia Ann Vikas Wegmann, FNP

## 2017-04-19 LAB — URINE CULTURE
MICRO NUMBER:: 81323925
SPECIMEN QUALITY:: ADEQUATE

## 2017-04-19 LAB — URINALYSIS, MICROSCOPIC ONLY

## 2017-06-15 ENCOUNTER — Other Ambulatory Visit: Payer: Self-pay | Admitting: Family Medicine

## 2017-06-16 ENCOUNTER — Telehealth: Payer: Self-pay | Admitting: Family Medicine

## 2017-06-16 NOTE — Telephone Encounter (Signed)
Copied from CRM [id number]. Topic: Quick Communication - Rx Refill/Question >> Jun 16, 2017  9:07 AM Landry MellowFoltz, Melissa J wrote: Medication:  venlafaxine XR (EFFEXOR-XR) 150 MG 24 hr capsule     Has the patient contacted their pharmacy? No. Pt has no refills, has a transfer appt on 07/05/17, asking for refill until seen.    (Agent: If no, request that the patient contact the pharmacy for the refill.)   Preferred Pharmacy (with phone number or street name): [facility] [place]    Agent: Please be advised that RX refills may take up to 3 business days. We ask that you follow-up with your pharmacy.

## 2017-06-16 NOTE — Telephone Encounter (Signed)
Last OV with Roddie McJulia Kordsmeier NP 04/18/17, last filled 03/22/17 90 [id number]

## 2017-06-16 NOTE — Telephone Encounter (Signed)
Request  For  Effexor -xr   Refill .  Last  Seen  By  Dr  Adriana Simasook 0724/2018     Last  Med  Refill  03/22/2017

## 2017-06-16 NOTE — Telephone Encounter (Signed)
Patient has appointment with Dr.Tracy [id number]. Please advise.

## 2017-06-17 NOTE — Telephone Encounter (Signed)
This was already sent yesterday for 30 day supply will refill more at appt   Thanks tSM

## 2017-07-05 ENCOUNTER — Ambulatory Visit: Payer: BC Managed Care – PPO | Admitting: Internal Medicine

## 2017-07-18 ENCOUNTER — Other Ambulatory Visit: Payer: Self-pay | Admitting: Internal Medicine

## 2017-07-18 NOTE — Telephone Encounter (Signed)
Copied from CRM [id number]. Topic: Quick Communication - Rx Refill/Question >> Jul 18, 2017  3:40 PM Raquel SarnaHayes, Teresa G wrote: Venlafaxine 150 mg  Pt has 1 pill left.  Needing a refill before New pt visit 08-04-17  [id number] [place] - [address] AT [facility] [address] [place] [id number] Phone: [phone] Fax: [phone]

## 2017-07-19 NOTE — Telephone Encounter (Signed)
Last OV; has new pt. appt. scheduled on 08/04/17  (former pt. of Everlene OtherJayce Cook)  PCP:  Dr. Vilma PraderMcLean-Scoucuzza  Pharm.:  [facility] [place] @ corner of [address]

## 2017-07-20 MED ORDER — VENLAFAXINE HCL ER 150 MG PO CP24: ORAL_CAPSULE | ORAL | 0 refills | Status: DC

## 2017-07-20 NOTE — Telephone Encounter (Signed)
Pt calling to about the RX for cvenlafaxine XR (EFFEXOR-XR) 150 MG 24 hr capsule  she states that she is completely out please call pt when this RX get sent to pharmacy

## 2017-08-04 ENCOUNTER — Ambulatory Visit: Payer: BC Managed Care – PPO | Admitting: Internal Medicine

## 2017-08-04 ENCOUNTER — Encounter: Payer: Self-pay | Admitting: Internal Medicine

## 2017-08-04 VITALS — BP 108/70 | HR 102 | Temp 99.2°F | Ht 61.75 in | Wt 167.2 lb

## 2017-08-04 DIAGNOSIS — F419 Anxiety disorder, unspecified: Secondary | ICD-10-CM

## 2017-08-04 DIAGNOSIS — E538 Deficiency of other specified B group vitamins: Secondary | ICD-10-CM

## 2017-08-04 DIAGNOSIS — Z0184 Encounter for antibody response examination: Secondary | ICD-10-CM | POA: Diagnosis not present

## 2017-08-04 DIAGNOSIS — Z1329 Encounter for screening for other suspected endocrine disorder: Secondary | ICD-10-CM

## 2017-08-04 DIAGNOSIS — Z87440 Personal history of urinary (tract) infections: Secondary | ICD-10-CM | POA: Diagnosis not present

## 2017-08-04 DIAGNOSIS — E669 Obesity, unspecified: Secondary | ICD-10-CM

## 2017-08-04 DIAGNOSIS — E559 Vitamin D deficiency, unspecified: Secondary | ICD-10-CM

## 2017-08-04 DIAGNOSIS — Z13220 Encounter for screening for lipoid disorders: Secondary | ICD-10-CM

## 2017-08-04 DIAGNOSIS — L409 Psoriasis, unspecified: Secondary | ICD-10-CM | POA: Diagnosis not present

## 2017-08-04 MED ORDER — VENLAFAXINE HCL ER 75 MG PO CP24: ORAL_CAPSULE | ORAL | 2 refills | Status: DC

## 2017-08-04 NOTE — Progress Notes (Signed)
Chief Complaint  Patient presents with  . Follow-up   Follow up  1. Anxiety improved only takes klonopin 0.5 qd prn but still has some left from 02/2017. Effexor XR on 150 mg qd she reports she was started on this for mood issue after pregnancy/postpartum depression. She wants to try to lower the dose  2. H/o psoriasis controlled on Humira 3. H/o low vitamin b12     Review of Systems  Constitutional: Negative for weight loss.  HENT: Negative for hearing loss.   Eyes: Negative for blurred vision.  Respiratory: Negative for shortness of breath.   Cardiovascular: Negative for chest pain.  Gastrointestinal: Negative for abdominal pain and blood in stool.  Musculoskeletal: Negative for joint pain.  Skin: Negative for rash.  Neurological: Negative for headaches.  Psychiatric/Behavioral: Negative for depression. The patient is nervous/anxious.    Past Medical History:  Diagnosis Date  . Anxiety   . Complete miscarriage   . Depression    postpartum   . Kidney stones   . Psoriasis    Past Surgical History:  Procedure Laterality Date  . CERVICAL BIOPSY    . KNEE SURGERY     cyst removal  . UTERINE SEPTUM RESECTION  04/2012   Dr[facility] Samuella Cota at [facility] History  Problem Relation Age of Onset  . Rashes / Skin problems Mother   . Hyperlipidemia Father   . Rashes / Skin problems Father   . Rashes / Skin problems Sister    Social History   Socioeconomic History  . Marital status: Married    Spouse name: Not on file  . Number of children: Not on file  . Years of education: Not on file  . Highest education level: Not on file  Social Needs  . Financial resource strain: Not on file  . Food insecurity - worry: Not on file  . Food insecurity - inability: Not on file  . Transportation needs - medical: Not on file  . Transportation needs - non-medical: Not on file  Occupational History  . Not on file  Tobacco Use  . Smoking status: Never Smoker  . Smokeless tobacco: Never  Used  Substance and Sexual Activity  . Alcohol use: No    Alcohol/week: 0.0 oz  . Drug use: No  . Sexual activity: Yes    Partners: Male  Other Topics Concern  . Not on file  Social History Narrative   4 th grade teacher    married   Current Meds  Medication Sig  . clonazePAM (KLONOPIN) 0.5 MG tablet Take 1 tablet (0.5 mg total) by mouth 2 (two) times daily as needed for anxiety. (Patient taking differently: Take 0.5 mg by mouth daily as needed for anxiety. )  . HUMIRA PEN 40 MG/0.8ML PNKT   . venlafaxine XR (EFFEXOR-XR) 75 MG 24 hr capsule TAKE 1 CAPSULE BY MOUTH DAILY WITH BREAKFAST  . [DISCONTINUED] venlafaxine XR (EFFEXOR-XR) 150 MG 24 hr capsule TAKE 1 CAPSULE BY MOUTH DAILY WITH BREAKFAST   No Known Allergies No results found for this or any previous visit (from the past 2160 hour(s)). Objective  Body mass index is 30.83 kg/m. Wt Readings from Last 3 Encounters:  08/04/17 167 lb 3.2 oz (75.8 kg)  04/18/17 162 lb 9.6 oz (73.8 kg)  12/14/16 164 lb 4 oz (74.5 kg)   Temp Readings from Last 3 Encounters:  08/04/17 99.2 F (37.3 C) (Oral)  04/18/17 98 F (36.7 C) (Oral)  12/14/16 99.1 F (37.3 C) (  Oral)   BP Readings from Last 3 Encounters:  08/04/17 108/70  04/18/17 110/88  12/14/16 110/76   Pulse Readings from Last 3 Encounters:  08/04/17 (!) 102  04/18/17 84  12/14/16 (!) 110   O2 sat room air 99% Physical Exam  Constitutional: She is oriented to person, place, and time and well-developed, well-nourished, and in no distress. Vital signs are normal.  HENT:  Head: Normocephalic and atraumatic.  Mouth/Throat: Oropharynx is clear and moist and mucous membranes are normal.  Eyes: Conjunctivae are normal. Pupils are equal, round, and reactive to light.  Cardiovascular: Normal rate, regular rhythm and normal heart sounds.  Pulmonary/Chest: Effort normal and breath sounds normal.  Neurological: She is alert and oriented to person, place, and time. Gait normal. Gait  normal.  Skin: Skin is warm, dry and intact.  Psychiatric: Mood, memory, affect and judgment normal.  Nursing note and vitals reviewed.   Assessment   1. Anxiety, h/o postpartum depression improved  2. Psoriasis  3. Vit B12 def  4. HM  Plan  1. On klonopin 0.5 but uses sparingly not even qd prn less freq Pt wants to taper down on Effexor xr from 150 to 75 xr qd consider reducing further at f/u  2. Controlled [place] in [place] h/o normal mole bx and no LN2  3. Check B12  4.  Declines flu shot  Tdap per pt had 4 years ago at health dept need to confirm date  Get copy of labs CMET, CBC from Dr. Guinevere FerrariAnna Graham [place]  Hep B note immune 2017 check titer to confirm   Will do lipid, TSH, T4, UA (h/o UTI), hep BsAb, B12 and vit D prevnar had Pap neg 09/22/15 neg HPV    Provider: Dr. French Anaracy McLean-Scocuzza-Internal Medicine

## 2017-08-04 NOTE — Progress Notes (Signed)
Pre visit review using our clinic review tool, if applicable. No additional management support is needed unless otherwise documented below in the visit note. 

## 2017-08-04 NOTE — Patient Instructions (Addendum)
Please sch labs fasting x 12 hours only water and medication 1st week spring break and see me later in the week  Reduce Effexor to 75 mg in the am Take care.   Hepatitis B Vaccine, Recombinant injection What is this medicine? HEPATITIS B VACCINE (hep uh TAHY tis B VAK seen) is a vaccine. It is used to prevent an infection with the hepatitis B virus. This medicine may be used for other purposes; ask your health care provider or pharmacist if you have questions. COMMON BRAND NAME(S): Engerix-B, Recombivax HB What should I tell my health care provider before I take this medicine? They need to know if you have any of these conditions: -fever, infection -heart disease -hepatitis B infection -immune system problems -kidney disease -an unusual or allergic reaction to vaccines, yeast, other medicines, foods, dyes, or preservatives -pregnant or trying to get pregnant -breast-feeding How should I use this medicine? This vaccine is for injection into a muscle. It is given by a health care professional. A copy of Vaccine Information Statements will be given before each vaccination. Read this sheet carefully each time. The sheet may change frequently. Talk to your pediatrician regarding the use of this medicine in children. While this drug may be prescribed for children as young as newborn for selected conditions, precautions do apply. Overdosage: If you think you have taken too much of this medicine contact a poison control center or emergency room at once. NOTE: This medicine is only for you. Do not share this medicine with others. What if I miss a dose? It is important not to miss your dose. Call your doctor or health care professional if you are unable to keep an appointment. What may interact with this medicine? -medicines that suppress your immune function like adalimumab, anakinra, infliximab -medicines to treat cancer -steroid medicines like prednisone or cortisone This list may not  describe all possible interactions. Give your health care provider a list of all the medicines, herbs, non-prescription drugs, or dietary supplements you use. Also tell them if you smoke, drink alcohol, or use illegal drugs. Some items may interact with your medicine. What should I watch for while using this medicine? See your health care provider for all shots of this vaccine as directed. You must have 3 shots of this vaccine for protection from hepatitis B infection. Tell your doctor right away if you have any serious or unusual side effects after getting this vaccine. What side effects may I notice from receiving this medicine? Side effects that you should report to your doctor or health care professional as soon as possible: -allergic reactions like skin rash, itching or hives, swelling of the face, lips, or tongue -breathing problems -confused, irritated -fast, irregular heartbeat -flu-like syndrome -numb, tingling pain -seizures -unusually weak or tired Side effects that usually do not require medical attention (report to your doctor or health care professional if they continue or are bothersome): -diarrhea -fever -headache -loss of appetite -muscle pain -nausea -pain, redness, swelling, or irritation at site where injected -tiredness This list may not describe all possible side effects. Call your doctor for medical advice about side effects. You may report side effects to FDA at 1-800-FDA-1088. Where should I keep my medicine? This drug is given in a hospital or clinic and will not be stored at home. NOTE: This sheet is a summary. It may not cover all possible information. If you have questions about this medicine, talk to your doctor, pharmacist, or health care provider.  2018 Elsevier/Gold  Standard (2013-09-10 13:26:01)   Vitamin B12 Deficiency Vitamin B12 deficiency occurs when the body does not have enough vitamin B12. Vitamin B12 is an important vitamin. The body needs  vitamin B12:  To make red blood cells.  To make DNA. This is the genetic material inside cells.  To help the nerves work properly so they can carry messages from the brain to the body.  Vitamin B12 deficiency can cause various health problems, such as a low red blood cell count (anemia) or nerve damage. What are the causes? This condition may be caused by:  Not eating enough foods that contain vitamin B12.  Not having enough stomach acid and digestive fluids to properly absorb vitamin B12 from the food that you eat.  Certain digestive system diseases that make it hard to absorb vitamin B12. These diseases include Crohn disease, chronic pancreatitis, and cystic fibrosis.  Pernicious anemia. This is a condition in which the body does not make enough of a protein (intrinsic factor), resulting in too few red blood cells.  Having a surgery in which part of the stomach or small intestine is removed.  Taking certain medicines that make it hard for the body to absorb vitamin B12. These medicines include: ? Heartburn medicine (antacids and proton pump inhibitors). ? An antibiotic medicine called neomycin. ? Some medicines that are used to treat diabetes, tuberculosis, gout, or high cholesterol.  What increases the risk? The following factors may make you more likely to develop a B12 deficiency:  Being older than age 53.  Eating a vegetarian or vegan diet, especially while you are pregnant.  Eating a poor diet while you are pregnant.  Taking certain drugs.  Having alcoholism.  What are the signs or symptoms? In some cases, there are no symptoms of this condition. If the condition leads to anemia or nerve damage, various symptoms can occur, such as:  Weakness.  Fatigue.  Loss of appetite.  Weight loss.  Numbness or tingling in your hands and feet.  Redness and burning of the tongue.  Confusion or memory problems.  Depression.  Sensory problems, such as color blindness,  ringing in the ears, or loss of taste.  Diarrhea or constipation.  Trouble walking.  If anemia is severe, symptoms can include:  Shortness of breath.  Dizziness.  Rapid heart rate (tachycardia).  How is this diagnosed? This condition may be diagnosed with a blood test to measure the level of vitamin B12 in your blood. You may have other tests to help find the cause of your vitamin B12 deficiency. These tests may include:  A complete blood count (CBC). This is a group of tests that measure certain characteristics of blood cells.  A blood test to measure intrinsic factor.  An endoscopy. In this procedure, a thin tube with a camera on the end is used to look into your stomach or intestines.  How is this treated? Treatment for this condition depends on the cause. Common treatment options include:  Changing your eating and drinking habits, such as: ? Eating more foods that contain vitamin B12. ? Drinking less alcohol or no alcohol.  Taking vitamin B12 supplements. Your health care provider will tell you which dosage is best for you.  Getting vitamin B12 injections.  Follow these instructions at home:  Take supplements only as told by your health care provider. Follow the directions carefully.  Get any injections that are prescribed by your health care provider.  Do not miss your appointments.  Eat lots of  healthy foods that contain vitamin B12. Ask your health care provider if you should work with a dietitian. Foods that contain vitamin B12 include: ? Meat. ? Meat from birds (poultry). ? Fish. ? Eggs. ? Cereal and dairy products that are fortified. This means that vitamin B12 has been added to the food. Check the label on the package to see if the food is fortified.  Do not abuse alcohol.  Keep all follow-up visits as told by your health care provider. This is important. Contact a health care provider if:  Your symptoms come back. Get help right away if:  You develop  shortness of breath.  You have chest pain.  You become dizzy or you lose consciousness. This information is not intended to replace advice given to you by your health care provider. Make sure you discuss any questions you have with your health care provider. Document Released: 08/02/2011 Document Revised: 10/22/2015 Document Reviewed: 09/25/2014 Elsevier Interactive Patient Education  2018 [employer].

## 2017-08-18 ENCOUNTER — Ambulatory Visit: Payer: BC Managed Care – PPO | Admitting: Internal Medicine

## 2017-08-21 ENCOUNTER — Encounter (HOSPITAL_COMMUNITY): Payer: Self-pay | Admitting: Emergency Medicine

## 2017-08-21 ENCOUNTER — Emergency Department (HOSPITAL_COMMUNITY): Payer: BC Managed Care – PPO

## 2017-08-21 DIAGNOSIS — Y998 Other external cause status: Secondary | ICD-10-CM | POA: Diagnosis not present

## 2017-08-21 DIAGNOSIS — S0990XA Unspecified injury of head, initial encounter: Secondary | ICD-10-CM | POA: Insufficient documentation

## 2017-08-21 DIAGNOSIS — Z79899 Other long term (current) drug therapy: Secondary | ICD-10-CM | POA: Insufficient documentation

## 2017-08-21 DIAGNOSIS — R42 Dizziness and giddiness: Secondary | ICD-10-CM | POA: Insufficient documentation

## 2017-08-21 DIAGNOSIS — Y939 Activity, unspecified: Secondary | ICD-10-CM | POA: Diagnosis not present

## 2017-08-21 DIAGNOSIS — W228XXA Striking against or struck by other objects, initial encounter: Secondary | ICD-10-CM | POA: Diagnosis not present

## 2017-08-21 DIAGNOSIS — Y929 Unspecified place or not applicable: Secondary | ICD-10-CM | POA: Insufficient documentation
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
## 2017-08-21 NOTE — ED Notes (Signed)
C-collar applied

## 2017-08-21 NOTE — ED Notes (Signed)
Pt became apprehensive and tearful when I attempted to take her to the waiting room. Pt states "too scared to go out here by myself"  Pt placed in A hallway for comfort.

## 2017-08-22 ENCOUNTER — Emergency Department (HOSPITAL_COMMUNITY)
Admission: EM | Admit: 2017-08-22 | Discharge: 2017-08-22 | Disposition: A | Discharge status: Home / Self Care | Payer: BC Managed Care – PPO | Attending: Emergency Medicine | Admitting: Emergency Medicine

## 2017-08-22 ENCOUNTER — Encounter (HOSPITAL_COMMUNITY): Payer: Self-pay | Admitting: Emergency Medicine

## 2017-08-22 DIAGNOSIS — S0990XA Unspecified injury of head, initial encounter: Secondary | ICD-10-CM

## 2017-08-22 MED ORDER — KETOROLAC TROMETHAMINE 30 MG/ML IJ SOLN: 15.0000 mg | Freq: Once | INTRAMUSCULAR | Status: DC

## 2017-08-22 MED ORDER — NAPROXEN 375 MG PO TABS: 375.0000 mg | ORAL_TABLET | Freq: Two times a day (BID) | ORAL | 0 refills | Status: DC
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
## 2017-08-22 NOTE — ED Notes (Signed)
Updated pt on wait for treatment room.  She is wanting to leave and f/u with PCP.  Encouraged her to stay.  CTs are resulted.  Will get pt to hallway bed.

## 2017-09-12 ENCOUNTER — Other Ambulatory Visit: Payer: Self-pay | Admitting: Internal Medicine

## 2017-09-12 ENCOUNTER — Telehealth: Payer: Self-pay | Admitting: Internal Medicine

## 2017-09-12 ENCOUNTER — Other Ambulatory Visit: Payer: BC Managed Care – PPO

## 2017-09-12 DIAGNOSIS — E559 Vitamin D deficiency, unspecified: Secondary | ICD-10-CM

## 2017-09-12 DIAGNOSIS — Z1159 Encounter for screening for other viral diseases: Secondary | ICD-10-CM

## 2017-09-12 DIAGNOSIS — Z1389 Encounter for screening for other disorder: Secondary | ICD-10-CM

## 2017-09-12 DIAGNOSIS — Z1329 Encounter for screening for other suspected endocrine disorder: Secondary | ICD-10-CM

## 2017-09-12 DIAGNOSIS — Z13220 Encounter for screening for lipoid disorders: Secondary | ICD-10-CM

## 2017-09-12 DIAGNOSIS — Z0184 Encounter for antibody response examination: Secondary | ICD-10-CM

## 2017-09-12 DIAGNOSIS — E538 Deficiency of other specified B group vitamins: Secondary | ICD-10-CM

## 2017-09-12 NOTE — Telephone Encounter (Signed)
Mal AmabileBrock call pt back orders in please print  Call pt 1st thing in am  If she calls us in the afternoon for next day service in the future we may not be able to get back with her technically we have 2-3 days to reply to issues such as this   TMS
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
## 2017-09-12 NOTE — Telephone Encounter (Signed)
Please advise 

## 2017-09-13 NOTE — Telephone Encounter (Signed)
Patient has been informed. She also has appointment this Friday.

## 2017-09-14 ENCOUNTER — Telehealth: Payer: Self-pay

## 2017-09-14 ENCOUNTER — Other Ambulatory Visit: Payer: Self-pay | Admitting: Internal Medicine

## 2017-09-14 NOTE — Telephone Encounter (Signed)
Lab orders faxed to [facility].  Fax: [phone]

## 2017-09-15 LAB — VITAMIN D 25 HYDROXY (VIT D DEFICIENCY, FRACTURES): VIT D 25 HYDROXY: 25.8 ng/mL — AB (ref 30.0–100.0)

## 2017-09-15 LAB — LIPID PANEL W/O CHOL/HDL RATIO
Cholesterol, Total: 174 mg/dL (ref 100–199)
HDL: 58 mg/dL (ref >39)
LDL Calculated: 107 mg/dL — ABNORMAL HIGH (ref 0–99)
TRIGLYCERIDES: 47 mg/dL (ref 0–149)
VLDL Cholesterol Cal: 9 mg/dL (ref 5–40)

## 2017-09-15 LAB — T4, FREE: FREE T4: 1.1 ng/dL (ref 0.82–1.77)

## 2017-09-15 LAB — URINALYSIS, ROUTINE W REFLEX MICROSCOPIC
Bilirubin, UA: NEGATIVE
GLUCOSE, UA: NEGATIVE
KETONES UA: NEGATIVE
Leukocytes, UA: NEGATIVE
NITRITE UA: NEGATIVE
Protein, UA: NEGATIVE
RBC, UA: NEGATIVE
SPEC GRAV UA: 1.023 (ref 1.005–1.030)
Urobilinogen, Ur: 1 mg/dL (ref 0.2–1.0)
pH, UA: 7 (ref 5.0–7.5)

## 2017-09-15 LAB — SPECIMEN STATUS REPORT

## 2017-09-15 LAB — HEPATITIS B SURFACE ANTIBODY,QUALITATIVE: HEP B SURFACE AB, QUAL: NONREACTIVE

## 2017-09-15 LAB — VITAMIN B12: Vitamin B-12: 354 pg/mL (ref 232–1245)

## 2017-09-15 LAB — TSH: TSH: 1.37 u[IU]/mL (ref 0.450–4.500)

## 2017-09-16 ENCOUNTER — Ambulatory Visit: Payer: BC Managed Care – PPO | Admitting: Internal Medicine

## 2017-10-25 ENCOUNTER — Encounter: Payer: Self-pay | Admitting: Internal Medicine

## 2017-10-25 ENCOUNTER — Ambulatory Visit: Payer: BC Managed Care – PPO | Admitting: Internal Medicine

## 2017-10-25 VITALS — BP 118/70 | HR 89 | Temp 98.5°F | Ht 61.75 in | Wt 170.6 lb

## 2017-10-25 DIAGNOSIS — E041 Nontoxic single thyroid nodule: Secondary | ICD-10-CM | POA: Diagnosis not present

## 2017-10-25 DIAGNOSIS — E669 Obesity, unspecified: Secondary | ICD-10-CM

## 2017-10-25 DIAGNOSIS — Z8659 Personal history of other mental and behavioral disorders: Secondary | ICD-10-CM

## 2017-10-25 DIAGNOSIS — F419 Anxiety disorder, unspecified: Secondary | ICD-10-CM | POA: Diagnosis not present

## 2017-10-25 DIAGNOSIS — E559 Vitamin D deficiency, unspecified: Secondary | ICD-10-CM | POA: Insufficient documentation

## 2017-10-25 DIAGNOSIS — Z8759 Personal history of other complications of pregnancy, childbirth and the puerperium: Secondary | ICD-10-CM

## 2017-10-25 HISTORY — DX: Personal history of other mental and behavioral disorders: Z86.59

## 2017-10-25 MED ORDER — PHENTERMINE HCL 37.5 MG PO TABS: 37.5000 mg | ORAL_TABLET | Freq: Every day | ORAL | 0 refills | Status: DC

## 2017-10-25 MED ORDER — VENLAFAXINE HCL ER 37.5 MG PO CP24: ORAL_CAPSULE | ORAL | 2 refills | Status: DC

## 2017-10-25 NOTE — Patient Instructions (Addendum)
Will sch thyroid US at [facility]  F/u 1 month    Exercising to Lose Weight Exercising can help you to lose weight. In order to lose weight through exercise, you need to do vigorous-intensity exercise. You can tell that you are exercising with vigorous intensity if you are breathing very hard and fast and cannot hold a conversation while exercising. Moderate-intensity exercise helps to maintain your current weight. You can tell that you are exercising at a moderate level if you have a higher heart rate and faster breathing, but you are still able to hold a conversation. How often should I exercise? Choose an activity that you enjoy and set realistic goals. Your health care provider can help you to make an activity plan that works for you. Exercise regularly as directed by your health care provider. This may include:  Doing resistance training twice each week, such as: ? Push-ups. ? Sit-ups. ? Lifting weights. ? Using resistance bands.  Doing a given intensity of exercise for a given amount of time. Choose from these options: ? 150 minutes of moderate-intensity exercise every week. ? 75 minutes of vigorous-intensity exercise every week. ? A mix of moderate-intensity and vigorous-intensity exercise every week.  Children, pregnant women, people who are out of shape, people who are overweight, and older adults may need to consult a health care provider for individual recommendations. If you have any sort of medical condition, be sure to consult your health care provider before starting a new exercise program. What are some activities that can help me to lose weight?  Walking at a rate of at least 4.5 miles an hour.  Jogging or running at a rate of 5 miles per hour.  Biking at a rate of at least 10 miles per hour.  Lap swimming.  Roller-skating or in-line skating.  Cross-country skiing.  Vigorous competitive sports, such as football, basketball, and soccer.  Jumping rope.  Aerobic  dancing. How can I be more active in my day-to-day activities?  Use the stairs instead of the elevator.  Take a walk during your lunch break.  If you drive, park your car farther away from work or school.  If you take public transportation, get off one stop early and walk the rest of the way.  Make all of your phone calls while standing up and walking around.  Get up, stretch, and walk around every 30 minutes throughout the day. What guidelines should I follow while exercising?  Do not exercise so much that you hurt yourself, feel dizzy, or get very short of breath.  Consult your health care provider prior to starting a new exercise program.  Wear comfortable clothes and shoes with good support.  Drink plenty of water while you exercise to prevent dehydration or heat stroke. Body water is lost during exercise and must be replaced.  Work out until you breathe faster and your heart beats faster. This information is not intended to replace advice given to you by your health care provider. Make sure you discuss any questions you have with your health care provider. Document Released: 06/12/2010 Document Revised: 10/16/2015 Document Reviewed: 10/11/2013 Elsevier Interactive Patient Education  2018 [employer].  Thyroid Nodule A thyroid nodule is an isolatedgrowth of thyroid cells that forms a lump in your thyroid gland. The thyroid gland is a butterfly-shaped gland. It is found in the lower front of your neck. This gland sends chemical messengers (hormones) through your blood to all parts of your body. These hormones are important in  regulating your body temperature and helping your body to use energy. Thyroid nodules are common. Most are not cancerous (are benign). You may have one nodule or several nodules. Different types of thyroid nodules include:  Nodules that grow and fill with fluid (thyroid cysts).  Nodules that produce too much thyroid hormone (hot nodules or  hyperthyroid).  Nodules that produce no thyroid hormone (cold nodules or hypothyroid).  Nodules that form from cancer cells (thyroid cancers).  What are the causes? Usually, the cause of this condition is not known. What increases the risk? Factors that make this condition more likely to develop include:  Increasing age. Thyroid nodules become more common in people who are older than 35 years of age.  Gender. ? Benign thyroid nodules are more common in women. ? Cancerous (malignant) thyroid nodules are more common in men.  A family history that includes: ? Thyroid nodules. ? Pheochromocytoma. ? Thyroid carcinoma. ? Hyperparathyroidism.  Certain kinds of thyroid diseases, such as Hashimoto thyroiditis.  Lack of iodine.  A history of head and neck radiation, such as from X-rays.  What are the signs or symptoms? It is common for this condition to cause no symptoms. If you have symptoms, they may include:  A lump in your lower neck.  Feeling a lump or tickle in your throat.  Pain in your neck, jaw, or ear.  Having trouble swallowing.  Hot nodules may cause symptoms that include:  Weight loss.  Warm, flushed skin.  Feeling hot.  Feeling nervous.  A racing heartbeat.  Cold nodules may cause symptoms that include:  Weight gain.  Dry skin.  Brittle hair. This may also occur with hair loss.  Feeling cold.  Fatigue.  Thyroid cancer nodules may cause symptoms that include:  Hard nodules that feel stuck to the thyroid gland.  Hoarseness.  Lumps in the glands near your thyroid (lymph nodes).  How is this diagnosed? A thyroid nodule may be felt by your health care provider during a physical exam. This condition may also be diagnosed based on your symptoms. You may also have tests, including:  An ultrasound. This may be done to confirm the diagnosis.  A biopsy. This involves taking a sample from the nodule and looking at it under a microscope to see if  the nodule is benign.  Blood tests to make sure that your thyroid is working properly.  Imaging tests such as MRI or CT scan may be done if: ? Your nodule is large. ? Your nodule is blocking your airway. ? Cancer is suspected.  How is this treated? Treatment depends on the cause and size of your nodule or nodules. If the nodule is benign, treatment may not be necessary. Your health care provider may monitor the nodule to see if it goes away without treatment. If the nodule continues to grow, is cancerous, or does not go away:  It may need to be drained with a needle.  It may need to be removed with surgery.  If you have surgery, part or all of your thyroid gland may need to be removed as well. Follow these instructions at home:  Pay attention to any changes in your nodule.  Take over-the-counter and prescription medicines only as told by your health care provider.  Keep all follow-up visits as told by your health care provider. This is important. Contact a health care provider if:  Your voice changes.  You have trouble swallowing.  You have pain in your neck, ear, or jaw  that is getting worse.  Your nodule gets bigger.  Your nodule starts to make it harder for you to breathe. Get help right away if:  You have a sudden fever.  You feel very weak.  Your muscles look like they are shrinking (muscle wasting).  You have mood swings.  You feel very restless.  You feel confused.  You are seeing or hearing things that other people do not see or hear (having hallucinations).  You feel suddenly nauseous or throw up.  You suddenly have diarrhea.  You have chest pain.  There is a loss of consciousness. This information is not intended to replace advice given to you by your health care provider. Make sure you discuss any questions you have with your health care provider. Document Released: 04/02/2004 Document Revised: 01/11/2016 Document Reviewed: 08/21/2014 Elsevier  Interactive Patient Education  2018 [employer]

## 2017-10-25 NOTE — Progress Notes (Signed)
Pre visit review using our clinic review tool, if applicable. No additional management support is needed unless otherwise documented below in the visit note. 

## 2017-10-25 NOTE — Progress Notes (Addendum)
Chief Complaint  Patient presents with  . Follow-up   F/u  1. CT 07/2017 reviewed with thyroid nodule reviewed thyroid labs nl TSH, and free T4  2. Anxiety and h/o postpartum depression both doing better tolerating reduced effexor xr 75 mg qd but wants to reduce dose further  3. Obesity and c/w with wt gain pt wants to try to get down to 130 lbs she has been on adipex in the past and wants to try this again    Review of Systems  Constitutional: Negative for weight loss.  HENT: Negative for hearing loss.   Eyes: Negative for blurred vision.  Respiratory: Negative for shortness of breath.   Cardiovascular: Negative for chest pain.  Gastrointestinal: Negative for abdominal pain.  Musculoskeletal: Negative for falls.  Skin: Negative for rash.  Neurological: Negative for headaches.  Psychiatric/Behavioral: Negative for depression. The patient is not nervous/anxious.    Past Medical History:  Diagnosis Date  . Anxiety   . Complete miscarriage   . Depression    postpartum   . Kidney stones   . Psoriasis   . UTI (urinary tract infection)    Past Surgical History:  Procedure Laterality Date  . CERVICAL BIOPSY    . KNEE SURGERY     cyst removal  . UTERINE SEPTUM RESECTION  04[facility]2012   [facility]   Family History  Problem Relation Age of Onset  . Rashes / Skin problems Mother   . Hyperlipidemia Father   . Rashes / Skin problems Father   . Rashes / Skin problems Sister    Social History   Socioeconomic History  . Marital status: Married    Spouse name: Not on file  . Number of children: Not on file  . Years of education: Not on file  . Highest education level: Not on file  Occupational History  . Not on file  Social Needs  . Financial resource strain: Not on file  . Food insecurity:    Worry: Not on file    Inability: Not on file  . Transportation needs:    Medical: Not on file    Non-medical: Not on file  Tobacco Use  . Smoking status: Never Smoker  .  Smokeless tobacco: Never Used  Substance and Sexual Activity  . Alcohol use: No    Alcohol/week: 0.0 oz  . Drug use: No  . Sexual activity: Yes    Partners: Male  Lifestyle  . Physical activity:    Days per week: Not on file    Minutes per session: Not on file  . Stress: Not on file  Relationships  . Social connections:    Talks on phone: Not on file    Gets together: Not on file    Attends religious service: Not on file    Active member of club or organization: Not on file    Attends meetings of clubs or organizations: Not on file    Relationship status: Not on file  . Intimate partner violence:    Fear of current or ex partner: Not on file    Emotionally abused: Not on file    Physically abused: Not on file    Forced sexual activity: Not on file  Other Topics Concern  . Not on file  Social History Narrative   4 th grade teacher    married   Current Meds  Medication Sig  . clonazePAM (KLONOPIN) 0.5 MG tablet Take 1 tablet (0.5 mg total) by mouth  2 (two) times daily as needed for anxiety. (Patient taking differently: Take 0.5 mg by mouth daily as needed for anxiety. )  . HUMIRA PEN 40 MG/0.8ML PNKT   . naproxen (NAPROSYN) 375 MG tablet Take 1 tablet (375 mg total) by mouth 2 (two) times daily with a meal.  . venlafaxine XR (EFFEXOR-XR) 75 MG 24 hr capsule TAKE 1 CAPSULE BY MOUTH DAILY WITH BREAKFAST   No Known Allergies Recent Results (from the past 2160 hour(s))  Urinalysis, Routine w reflex microscopic     Status: None   Collection Time: 09/14/17 10:09 AM  Result Value Ref Range   Specific Gravity, UA 1.023 1.005 - 1.030   pH, UA 7.0 5.0 - 7.5   Color, UA Yellow Yellow   Appearance Ur Clear Clear   Leukocytes, UA Negative Negative   Protein, UA Negative Negative/Trace   Glucose, UA Negative Negative   Ketones, UA Negative Negative   RBC, UA Negative Negative   Bilirubin, UA Negative Negative   Urobilinogen, Ur 1.0 0.2 - 1.0 mg/dL   Nitrite, UA Negative Negative    Microscopic Examination Comment     Comment: Microscopic not indicated and not performed.  Lipid Panel w/o Chol/HDL Ratio     Status: Abnormal   Collection Time: 09/14/17 10:09 AM  Result Value Ref Range   Cholesterol, Total 174 100 - 199 mg/dL   Triglycerides 47 0 - 149 mg/dL   HDL 58 >16 mg/dL   VLDL Cholesterol Cal 9 5 - 40 mg/dL   LDL Calculated 109 (H) 0 - 99 mg/dL  T4, free     Status: None   Collection Time: 09/14/17 10:09 AM  Result Value Ref Range   Free T4 1.10 0.82 - 1.77 ng/dL  TSH     Status: None   Collection Time: 09/14/17 10:09 AM  Result Value Ref Range   TSH 1.370 0.450 - 4.500 uIU/mL  Hepatitis B surface antibody     Status: None   Collection Time: 09/14/17 10:09 AM  Result Value Ref Range   Hep B Surface Ab, Qual Non Reactive     Comment:               Non Reactive: Inconsistent with immunity,                             less than 10 mIU/mL               Reactive:     Consistent with immunity,                             greater than 9.9 mIU/mL   VITAMIN D 25 Hydroxy (Vit-D Deficiency, Fractures)     Status: Abnormal   Collection Time: 09/14/17 10:09 AM  Result Value Ref Range   Vit D, 25-Hydroxy 25.8 (L) 30.0 - 100.0 ng/mL    Comment: Vitamin D deficiency has been defined by the Institute of Medicine and an Endocrine Society practice guideline as a level of serum 25-OH vitamin D less than 20 ng/mL (1,2). The Endocrine Society went on to further define vitamin D insufficiency as a level between 21 and 29 ng/mL (2). 1. IOM (Institute of Medicine). 2010. Dietary reference    intakes for calcium and D. [place]: The    [employer]. 2. Holick MF, Binkley [place], Bischoff-Ferrari HA, et al.    Evaluation,  treatment, and prevention of vitamin D    deficiency: an Endocrine Society clinical practice    guideline. JCEM. 2011 Jul; 96(7):1911-30.   Vitamin B12     Status: None   Collection Time: 09/14/17 10:09 AM  Result Value Ref Range    Vitamin B-12 354 232 - 1,245 pg/mL  Specimen status report     Status: None   Collection Time: 09/14/17 10:09 AM  Result Value Ref Range   specimen status report Comment     Comment: Ambig Abbrev LP Default Ambig Abbrev LP Default A hand-written panel/profile was received from your office. In accordance with the [facility] Ambiguous Test Code Policy dated July 2003, we have completed your order by using the closest currently or formerly recognized AMA panel.  We have assigned Lipid Panel, Test Code [phone] to this request. If this is not the testing you wished to receive on this specimen, please contact the [facility] Client Inquiry/Technical Services Department to clarify the test order.  We appreciate your business.    Objective  Body mass index is 31.46 kg/m. Wt Readings from Last 3 Encounters:  10/25/17 170 lb 9.6 oz (77.4 kg)  08/04/17 167 lb 3.2 oz (75.8 kg)  04/18/17 162 lb 9.6 oz (73.8 kg)   Temp Readings from Last 3 Encounters:  10/25/17 98.5 F (36.9 C) (Oral)  08/22/17 98.1 F (36.7 C) (Oral)  08/04/17 99.2 F (37.3 C) (Oral)   BP Readings from Last 3 Encounters:  10/25/17 118/70  08/22/17 128/84  08/04/17 108/70   Pulse Readings from Last 3 Encounters:  10/25/17 89  08/22/17 92  08/04/17 (!) 102    Physical Exam  Constitutional: She is oriented to person, place, and time. Vital signs are normal. She appears well-developed and well-nourished. She is cooperative.  HENT:  Head: Normocephalic and atraumatic.  Mouth/Throat: Oropharynx is clear and moist and mucous membranes are normal.  Eyes: Pupils are equal, round, and reactive to light. Conjunctivae are normal.  Cardiovascular: Normal rate, regular rhythm and normal heart sounds.  Pulmonary/Chest: Effort normal and breath sounds normal.  Neurological: She is alert and oriented to person, place, and time. Gait normal.  Skin: Skin is warm, dry and intact.  Psychiatric: She has a normal mood and affect. Her  speech is normal and behavior is normal. Judgment and thought content normal. Cognition and memory are normal.  Nursing note and vitals reviewed.   Assessment   1. Thyroid nodule noted on CT cervical 07/2017  2. Anxiety and h/o postpartum depression  3. Obesity BMI 31.46  4. Vit D def  5. HM Plan  1. Will do thyroid US Thyroid labs normal  2. Reduce effexor 75 mg xr to 37.5 mg xr qd  3. adipex f/u in 1 month 4. On D3 5000 IU qd  5.  Declines flu shot  Tdap per pt had 4 years ago at health dept need to confirm date  Get copy of labs CMET, CBC from Dr. Guinevere Ferrari [facility] requested again today   Hep B not immune 2017 check titer to confirm, checked again non reactive and had vaccine being she is on humira may make her less likely to take/make vaccination at this time less effective will disc with Dr. Cheree Ditto  -obtained labs and noted 10/25/17 Dr. Cheree Ditto rec f/u with her  Labs 12/09/16 CBC normal  lfts normal  quantiferon TB gold neg  HCV neg  Hep B sAb qual nonreactive   prevnar had Pap neg 09/22/15 neg HPV  due 09/22/2018   Provider: Dr. French Ana [facility]Lean-[facility]

## 2017-10-26 ENCOUNTER — Encounter (INDEPENDENT_AMBULATORY_CARE_PROVIDER_SITE_OTHER): Payer: Self-pay

## 2017-10-28 NOTE — Progress Notes (Signed)
Sent electronically 

## 2017-10-31 ENCOUNTER — Ambulatory Visit: Payer: BC Managed Care – PPO

## 2017-11-02 ENCOUNTER — Telehealth: Payer: Self-pay | Admitting: Internal Medicine

## 2017-11-02 NOTE — Telephone Encounter (Signed)
Left message for patient to return call back. [facility] may give results and obtain information.  

## 2017-11-02 NOTE — Telephone Encounter (Signed)
See previous telephone encounter from 11/02/17

## 2017-11-02 NOTE — Telephone Encounter (Signed)
Call pt reviewed labs Dr. Cheree DittoGraham They only checked her blood counts and liver enzymes 12/09/16 which were normal   She ultimately need f/u with Dr. Cheree DittoGraham for further refills on Humira per Dr. Cheree DittoGraham call to sch appt with her   Also they did not check her kidney #s CMET so if she could ask they check those with other labs and fax to us when done   TMS

## 2017-11-02 NOTE — Telephone Encounter (Unsigned)
Copied from CRM [phone]#[id number]. Topic: Quick Communication - See Telephone Encounter >> Nov 02, 2017 12:55 PM Bronwen BettersBooth, Brock T, CMA wrote: CRM for notification. See Telephone encounter for: Labs  11/02/17.

## 2017-11-02 NOTE — Telephone Encounter (Signed)
Left message on voicemail for pt to return call to office for results.    

## 2017-11-18 ENCOUNTER — Other Ambulatory Visit: Payer: Self-pay | Admitting: Internal Medicine

## 2017-11-18 DIAGNOSIS — F419 Anxiety disorder, unspecified: Secondary | ICD-10-CM

## 2017-11-18 DIAGNOSIS — E669 Obesity, unspecified: Secondary | ICD-10-CM

## 2017-11-18 MED ORDER — VENLAFAXINE HCL ER 37.5 MG PO CP24: ORAL_CAPSULE | ORAL | 2 refills | Status: DC

## 2017-11-23 ENCOUNTER — Other Ambulatory Visit: Payer: Self-pay | Admitting: Internal Medicine

## 2017-11-23 DIAGNOSIS — F419 Anxiety disorder, unspecified: Secondary | ICD-10-CM

## 2017-11-23 DIAGNOSIS — E669 Obesity, unspecified: Secondary | ICD-10-CM

## 2017-11-23 NOTE — Telephone Encounter (Signed)
What does of effexor does she want refilled 37.5 or 75 mg ? Ive sent 37.5 is pharmacy or pt requesting 75 mg?   TMS

## 2017-11-25 MED ORDER — VENLAFAXINE HCL ER 75 MG PO CP24: ORAL_CAPSULE | ORAL | 0 refills | Status: DC

## 2017-11-25 NOTE — Telephone Encounter (Addendum)
Spoke with patient this was automated refill  From pharmacy for  37.5mg  . She is needing effexor 75 mg her current dose . Current dose Also requesting refill for Phentermine. 1 pill left  NOV 12/06/17 LOV 10/25/17

## 2017-11-25 NOTE — Addendum Note (Signed)
Addended by: Glori LuisSONNENBERG, ERIC G on: 11/25/2017 05:52 PM   Modules accepted: Orders

## 2017-11-25 NOTE — Addendum Note (Signed)
Addended by: Alisia FerrariBARE, KRISTEN C on: 11/25/2017 11:25 AM   Modules accepted: Orders

## 2017-11-25 NOTE — Telephone Encounter (Addendum)
Effexor sent to pharmacy to cover for 1 month.  Phentermine will need to be refilled by PCP.  I will forward this to PCP.

## 2017-11-28 ENCOUNTER — Other Ambulatory Visit: Payer: Self-pay | Admitting: Internal Medicine

## 2017-11-28 DIAGNOSIS — F419 Anxiety disorder, unspecified: Secondary | ICD-10-CM

## 2017-11-28 DIAGNOSIS — E669 Obesity, unspecified: Secondary | ICD-10-CM

## 2017-11-28 MED ORDER — VENLAFAXINE HCL ER 75 MG PO CP24: ORAL_CAPSULE | ORAL | 1 refills | Status: DC

## 2017-11-28 MED ORDER — PHENTERMINE HCL 37.5 MG PO TABS: 37.5000 mg | ORAL_TABLET | Freq: Every day | ORAL | 0 refills | Status: DC

## 2017-12-06 ENCOUNTER — Ambulatory Visit: Payer: BC Managed Care – PPO | Admitting: Internal Medicine

## 2017-12-06 ENCOUNTER — Encounter: Payer: Self-pay | Admitting: Internal Medicine

## 2017-12-06 VITALS — BP 112/80 | HR 83 | Temp 98.4°F | Ht 61.75 in | Wt 164.4 lb

## 2017-12-06 DIAGNOSIS — E669 Obesity, unspecified: Secondary | ICD-10-CM

## 2017-12-06 DIAGNOSIS — Z8659 Personal history of other mental and behavioral disorders: Secondary | ICD-10-CM

## 2017-12-06 DIAGNOSIS — F419 Anxiety disorder, unspecified: Secondary | ICD-10-CM

## 2017-12-06 DIAGNOSIS — Z8759 Personal history of other complications of pregnancy, childbirth and the puerperium: Secondary | ICD-10-CM | POA: Diagnosis not present

## 2017-12-06 DIAGNOSIS — L409 Psoriasis, unspecified: Secondary | ICD-10-CM

## 2017-12-06 DIAGNOSIS — E559 Vitamin D deficiency, unspecified: Secondary | ICD-10-CM | POA: Diagnosis not present

## 2017-12-06 DIAGNOSIS — K581 Irritable bowel syndrome with constipation: Secondary | ICD-10-CM

## 2017-12-06 DIAGNOSIS — L811 Chloasma: Secondary | ICD-10-CM | POA: Diagnosis not present

## 2017-12-06 DIAGNOSIS — E041 Nontoxic single thyroid nodule: Secondary | ICD-10-CM | POA: Diagnosis not present

## 2017-12-06 DIAGNOSIS — K589 Irritable bowel syndrome without diarrhea: Secondary | ICD-10-CM | POA: Insufficient documentation

## 2017-12-06 MED ORDER — PHENTERMINE HCL 37.5 MG PO TABS: 37.5000 mg | ORAL_TABLET | Freq: Every day | ORAL | 0 refills | Status: DC

## 2017-12-06 NOTE — Progress Notes (Signed)
Pre visit review using our clinic review tool, if applicable. No additional management support is needed unless otherwise documented below in the visit note. 

## 2017-12-06 NOTE — Patient Instructions (Addendum)
Please sch dermatology appt ask her to check CMET, CBC  Please let us know if not hearing about thyroid ultrasound   Warm prune juice Fiber Metamucil or Benefiber  Fruits and Vegs  Miralax as needed  Flaxseed or Chia seeds   Sent 2 month supply of adipex   Linaclotide oral capsules What is this medicine? LINACLOTIDE (lin a KLOE tide) is used to treat irritable bowel syndrome (IBS) with constipation as the main problem. It may also be used for relief of chronic constipation. This medicine may be used for other purposes; ask your health care provider or pharmacist if you have questions. COMMON BRAND NAME(S): Linzess What should I tell my health care provider before I take this medicine? They need to know if you have any of these conditions: -history of stool (fecal) impaction -now have diarrhea or have diarrhea often -other medical condition -stomach or intestinal disease, including bowel obstruction or abdominal adhesions -an unusual or allergic reaction to linaclotide, other medicines, foods, dyes, or preservatives -pregnant or trying to get pregnant -breast-feeding How should I use this medicine? Take this medicine by mouth with a glass of water. Follow the directions on the prescription label. Do not cut, crush or chew this medicine. Take on an empty stomach, at least 30 minutes before your first meal of the day. Take your medicine at regular intervals. Do not take your medicine more often than directed. Do not stop taking except on your doctor's advice. A special MedGuide will be given to you by the pharmacist with each prescription and refill. Be sure to read this information carefully each time. Talk to your pediatrician regarding the use of this medicine in children. This medicine is not approved for use in children. Overdosage: If you think you have taken too much of this medicine contact a poison control center or emergency room at once. NOTE: This medicine is only for you. Do  not share this medicine with others. What if I miss a dose? If you miss a dose, just skip that dose. Wait until your next dose, and take only that dose. Do not take double or extra doses. What may interact with this medicine? -certain medicines for bowel problems or bladder incontinence (these can cause constipation) This list may not describe all possible interactions. Give your health care provider a list of all the medicines, herbs, non-prescription drugs, or dietary supplements you use. Also tell them if you smoke, drink alcohol, or use illegal drugs. Some items may interact with your medicine. What should I watch for while using this medicine? Visit your doctor for regular check ups. Tell your doctor if your symptoms do not get better or if they get worse. Diarrhea is a common side effect of this medicine. It often begins within 2 weeks of starting this medicine. Stop taking this medicine and call your doctor if you get severe diarrhea. Stop taking this medicine and call your doctor or go to the nearest hospital emergency room right away if you develop unusual or severe stomach-area (abdominal) pain, especially if you also have bright red, bloody stools or black stools that look like tar. What side effects may I notice from receiving this medicine? Side effects that you should report to your doctor or health care professional as soon as possible: -allergic reactions like skin rash, itching or hives, swelling of the face, lips, or tongue -black, tarry stools -bloody or watery diarrhea -new or worsening stomach pain -severe or prolonged diarrhea Side effects that usually do  not require medical attention (report to your doctor or health care professional if they continue or are bothersome): -bloating -gas -loose stools This list may not describe all possible side effects. Call your doctor for medical advice about side effects. You may report side effects to FDA at 1-800-FDA-1088. Where should  I keep my medicine? Keep out of the reach of children. Store at room temperature between 20 and 25 degrees C (68 and 77 degrees F). Keep this medicine in the original container. Keep tightly closed in a dry place. Do not remove the desiccant packet from the bottle, it helps to protect your medicine from moisture. Throw away any unused medicine after the expiration date. NOTE: This sheet is a summary. It may not cover all possible information. If you have questions about this medicine, talk to your doctor, pharmacist, or health care provider.  2018 Elsevier/Gold Standard (2015-06-12 12:17:04)  Irritable Bowel Syndrome, Adult Irritable bowel syndrome (IBS) is not one specific disease. It is a group of symptoms that affects the organs responsible for digestion (gastrointestinal or GI tract). To regulate how your GI tract works, your body sends signals back and forth between your intestines and your brain. If you have IBS, there may be a problem with these signals. As a result, your GI tract does not function normally. Your intestines may become more sensitive and overreact to certain things. This is especially true when you eat certain foods or when you are under stress. There are four types of IBS. These may be determined based on the consistency of your stool:  IBS with diarrhea.  IBS with constipation.  Mixed IBS.  Unsubtyped IBS.  It is important to know which type of IBS you have. Some treatments are more likely to be helpful for certain types of IBS. What are the causes? The exact cause of IBS is not known. What increases the risk? You may have a higher risk of IBS if:  You are a woman.  You are younger than 35 years old.  You have a family history of IBS.  You have mental health problems.  You have had bacterial infection of your GI tract.  What are the signs or symptoms? Symptoms of IBS vary from person to person. The main symptom is abdominal pain or discomfort. Additional  symptoms usually include one or more of the following:  Diarrhea, constipation, or both.  Abdominal swelling or bloating.  Feeling full or sick after eating a small or regular-size meal.  Frequent gas.  Mucus in the stool.  A feeling of having more stool left after a bowel movement.  Symptoms tend to come and go. They may be associated with stress, psychiatric conditions, or nothing at all. How is this diagnosed? There is no specific test to diagnose IBS. Your health care provider will make a diagnosis based on a physical exam, medical history, and your symptoms. You may have other tests to rule out other conditions that may be causing your symptoms. These may include:  Blood tests.  X-rays.  CT scan.  Endoscopy and colonoscopy. This is a test in which your GI tract is viewed with a long, thin, flexible tube.  How is this treated? There is no cure for IBS, but treatment can help relieve symptoms. IBS treatment often includes:  Changes to your diet, such as: ? Eating more fiber. ? Avoiding foods that cause symptoms. ? Drinking more water. ? Eating regular, medium-sized portioned meals.  Medicines. These may include: ? Fiber supplements if  you have constipation. ? Medicine to control diarrhea (antidiarrheal medicines). ? Medicine to help control muscle spasms in your GI tract (antispasmodic medicines). ? Medicines to help with any mental health issues, such as antidepressants or tranquilizers.  Therapy. ? Talk therapy may help with anxiety, depression, or other mental health issues that can make IBS symptoms worse.  Stress reduction. ? Managing your stress can help keep symptoms under control.  Follow these instructions at home:  Take medicines only as directed by your health care provider.  Eat a healthy diet. ? Avoid foods and drinks with added sugar. ? Include more whole grains, fruits, and vegetables gradually into your diet. This may be especially helpful if  you have IBS with constipation. ? Avoid any foods and drinks that make your symptoms worse. These may include dairy products and caffeinated or carbonated drinks. ? Do not eat large meals. ? Drink enough fluid to keep your urine clear or pale yellow.  Exercise regularly. Ask your health care provider for recommendations of good activities for you.  Keep all follow-up visits as directed by your health care provider. This is important. Contact a health care provider if:  You have constant pain.  You have trouble or pain with swallowing.  You have worsening diarrhea. Get help right away if:  You have severe and worsening abdominal pain.  You have diarrhea and: ? You have a rash, stiff neck, or severe headache. ? You are irritable, sleepy, or difficult to awaken. ? You are weak, dizzy, or extremely thirsty.  You have bright red blood in your stool or you have black tarry stools.  You have unusual abdominal swelling that is painful.  You vomit continuously.  You vomit blood (hematemesis).  You have both abdominal pain and a fever. This information is not intended to replace advice given to you by your health care provider. Make sure you discuss any questions you have with your health care provider. Document Released: 05/10/2005 Document Revised: 10/10/2015 Document Reviewed: 01/25/2014 Elsevier Interactive Patient Education  2018 [employer].  Diet for Irritable Bowel Syndrome When you have irritable bowel syndrome (IBS), the foods you eat and your eating habits are very important. IBS may cause various symptoms, such as abdominal pain, constipation, or diarrhea. Choosing the right foods can help ease discomfort caused by these symptoms. Work with your health care provider and dietitian to find the best eating plan to help control your symptoms. What general guidelines do I need to follow?  Keep a food diary. This will help you identify foods that cause symptoms. Write  down: ? What you eat and when. ? What symptoms you have. ? When symptoms occur in relation to your meals.  Avoid foods that cause symptoms. Talk with your dietitian about other ways to get the same nutrients that are in these foods.  Eat more foods that contain fiber. Take a fiber supplement if directed by your dietitian.  Eat your meals slowly, in a relaxed setting.  Aim to eat 5-6 small meals per day. Do not skip meals.  Drink enough fluids to keep your urine clear or pale yellow.  Ask your health care provider if you should take an over-the-counter probiotic during flare-ups to help restore healthy gut bacteria.  If you have cramping or diarrhea, try making your meals low in fat and high in carbohydrates. Examples of carbohydrates are pasta, rice, whole grain breads and cereals, fruits, and vegetables.  If dairy products cause your symptoms to flare up, try  eating less of them. You might be able to handle yogurt better than other dairy products because it contains bacteria that help with digestion. What foods are not recommended? The following are some foods and drinks that may worsen your symptoms:  Fatty foods, such as [place] fries.  Milk products, such as cheese or ice cream.  Chocolate.  Alcohol.  Products with caffeine, such as coffee.  Carbonated drinks, such as soda.  The items listed above may not be a complete list of foods and beverages to avoid. Contact your dietitian for more information. What foods are good sources of fiber? Your health care provider or dietitian may recommend that you eat more foods that contain fiber. Fiber can help reduce constipation and other IBS symptoms. Add foods with fiber to your diet a little at a time so that your body can get used to them. Too much fiber at once might cause gas and swelling of your abdomen. The following are some foods that are good sources of fiber:  Apples.  Peaches.  Pears.  Berries.  Figs.  Broccoli  (raw).  Cabbage.  Carrots.  Raw peas.  Kidney beans.  Lima beans.  Whole grain bread.  Whole grain cereal.  Where to find more information: [employer] for Functional Gastrointestinal Disorders: www.iffgd.[employer] of Diabetes and Digestive and Kidney Diseases: http://norris-lawson.com/.aspx This information is not intended to replace advice given to you by your health care provider. Make sure you discuss any questions you have with your health care provider. Document Released: 07/31/2003 Document Revised: 10/16/2015 Document Reviewed: 08/10/2013 Elsevier Interactive Patient Education  2018 [employer].  Constipation, Adult Constipation is when a person has fewer bowel movements in a week than normal, has difficulty having a bowel movement, or has stools that are dry, hard, or larger than normal. Constipation may be caused by an underlying condition. It may become worse with age if a person takes certain medicines and does not take in enough fluids. Follow these instructions at home: Eating and drinking   Eat foods that have a lot of fiber, such as fresh fruits and vegetables, whole grains, and beans.  Limit foods that are high in fat, low in fiber, or overly processed, such as french fries, hamburgers, cookies, candies, and soda.  Drink enough fluid to keep your urine clear or pale yellow. General instructions  Exercise regularly or as told by your health care provider.  Go to the restroom when you have the urge to go. Do not hold it in.  Take over-the-counter and prescription medicines only as told by your health care provider. These include any fiber supplements.  Practice pelvic floor retraining exercises, such as deep breathing while relaxing the lower abdomen and pelvic floor relaxation during bowel movements.  Watch your condition for any changes.  Keep all follow-up visits as  told by your health care provider. This is important. Contact a health care provider if:  You have pain that gets worse.  You have a fever.  You do not have a bowel movement after 4 days.  You vomit.  You are not hungry.  You lose weight.  You are bleeding from the anus.  You have thin, pencil-like stools. Get help right away if:  You have a fever and your symptoms suddenly get worse.  You leak stool or have blood in your stool.  Your abdomen is bloated.  You have severe pain in your abdomen.  You feel dizzy or you faint. This information is not  intended to replace advice given to you by your health care provider. Make sure you discuss any questions you have with your health care provider. Document Released: 02/06/2004 Document Revised: 11/28/2015 Document Reviewed: 10/29/2015 Elsevier Interactive Patient Education  2018 [employer].

## 2017-12-06 NOTE — Progress Notes (Signed)
Chief Complaint  Patient presents with  . Follow-up   F/u  1. C/o IBS-C took Linzess 145 in past w/o relief.  She will go 1x per week  2. Obesity with wt loss on adipex tolerating but reports more irritable on adipex. She is swimming to help with wt loss outdoor and using sunscreen 3. C/o melasma to right lip worse in the sun using spf  4. Thyroid US never sch for nodule need to get scheduled.  Review of Systems  Constitutional: Positive for weight loss.  HENT: Negative for hearing loss.   Eyes: Negative for blurred vision.  Respiratory: Negative for shortness of breath.   Cardiovascular: Negative for chest pain.  Gastrointestinal: Positive for constipation. Negative for blood in stool.  Skin: Positive for rash.   Past Medical History:  Diagnosis Date  . Anxiety   . Complete miscarriage   . Depression    postpartum   . Kidney stones   . Psoriasis   . UTI (urinary tract infection)    Past Surgical History:  Procedure Laterality Date  . CERVICAL BIOPSY    . KNEE SURGERY     cyst removal  . UTERINE SEPTUM RESECTION  04/2012   Dr. Samuella Cota at [facility] History  Problem Relation Age of Onset  . Rashes / Skin problems Mother   . Hyperlipidemia Father   . Rashes / Skin problems Father   . Rashes / Skin problems Sister    Social History   Socioeconomic History  . Marital status: Married    Spouse name: Not on file  . Number of children: Not on file  . Years of education: Not on file  . Highest education level: Not on file  Occupational History  . Not on file  Social Needs  . Financial resource strain: Not on file  . Food insecurity:    Worry: Not on file    Inability: Not on file  . Transportation needs:    Medical: Not on file    Non-medical: Not on file  Tobacco Use  . Smoking status: Never Smoker  . Smokeless tobacco: Never Used  Substance and Sexual Activity  . Alcohol use: No    Alcohol/week: 0.0 oz  . Drug use: No  . Sexual activity: Yes   Partners: Male  Lifestyle  . Physical activity:    Days per week: Not on file    Minutes per session: Not on file  . Stress: Not on file  Relationships  . Social connections:    Talks on phone: Not on file    Gets together: Not on file    Attends religious service: Not on file    Active member of club or organization: Not on file    Attends meetings of clubs or organizations: Not on file    Relationship status: Not on file  . Intimate partner violence:    Fear of current or ex partner: Not on file    Emotionally abused: Not on file    Physically abused: Not on file    Forced sexual activity: Not on file  Other Topics Concern  . Not on file  Social History Narrative   4 th grade teacher    married   Current Meds  Medication Sig  . Cholecalciferol (VITAMIN D3) 5000 units CAPS Take by mouth.  . clonazePAM (KLONOPIN) 0.5 MG tablet Take 1 tablet (0.5 mg total) by mouth 2 (two) times daily as needed for anxiety. (Patient taking differently: Take  0.5 mg by mouth daily as needed for anxiety. )  . HUMIRA PEN 40 MG/0.8ML PNKT   . naproxen (NAPROSYN) 375 MG tablet Take 1 tablet (375 mg total) by mouth 2 (two) times daily with a meal.  . phentermine (ADIPEX-P) 37.5 MG tablet Take 1 tablet (37.5 mg total) by mouth daily before breakfast.  . venlafaxine XR (EFFEXOR-XR) 75 MG 24 hr capsule TAKE 1 CAPSULE BY MOUTH DAILY WITH BREAKFAST   No Known Allergies Recent Results (from the past 2160 hour(s))  Urinalysis, Routine w reflex microscopic     Status: None   Collection Time: 09/14/17 10:09 AM  Result Value Ref Range   Specific Gravity, UA 1.023 1.005 - 1.030   pH, UA 7.0 5.0 - 7.5   Color, UA Yellow Yellow   Appearance Ur Clear Clear   Leukocytes, UA Negative Negative   Protein, UA Negative Negative/Trace   Glucose, UA Negative Negative   Ketones, UA Negative Negative   RBC, UA Negative Negative   Bilirubin, UA Negative Negative   Urobilinogen, Ur 1.0 0.2 - 1.0 mg/dL   Nitrite, UA  Negative Negative   Microscopic Examination Comment     Comment: Microscopic not indicated and not performed.  Lipid Panel w/o Chol/HDL Ratio     Status: Abnormal   Collection Time: 09/14/17 10:09 AM  Result Value Ref Range   Cholesterol, Total 174 100 - 199 mg/dL   Triglycerides 47 0 - 149 mg/dL   HDL 58 >96 mg/dL   VLDL Cholesterol Cal 9 5 - 40 mg/dL   LDL Calculated 045 (H) 0 - 99 mg/dL  T4, free     Status: None   Collection Time: 09/14/17 10:09 AM  Result Value Ref Range   Free T4 1.10 0.82 - 1.77 ng/dL  TSH     Status: None   Collection Time: 09/14/17 10:09 AM  Result Value Ref Range   TSH 1.370 0.450 - 4.500 uIU/mL  Hepatitis B surface antibody     Status: None   Collection Time: 09/14/17 10:09 AM  Result Value Ref Range   Hep B Surface Ab, Qual Non Reactive     Comment:               Non Reactive: Inconsistent with immunity,                             less than 10 mIU/mL               Reactive:     Consistent with immunity,                             greater than 9.9 mIU/mL   VITAMIN D 25 Hydroxy (Vit-D Deficiency, Fractures)     Status: Abnormal   Collection Time: 09/14/17 10:09 AM  Result Value Ref Range   Vit D, 25-Hydroxy 25.8 (L) 30.0 - 100.0 ng/mL    Comment: Vitamin D deficiency has been defined by the Institute of Medicine and an Endocrine Society practice guideline as a level of serum 25-OH vitamin D less than 20 ng/mL (1,2). The Endocrine Society went on to further define vitamin D insufficiency as a level between 21 and 29 ng/mL (2). 1. IOM (Institute of Medicine). 2010. Dietary reference    intakes for calcium and D. [place]: The    [employer]. 2. Holick MF, Binkley [place], Bischoff-Ferrari HA,  et al.    Evaluation, treatment, and prevention of vitamin D    deficiency: an Endocrine Society clinical practice    guideline. JCEM. 2011 Jul; 96(7):1911-30.   Vitamin B12     Status: None   Collection Time: 09/14/17 10:09 AM  Result Value  Ref Range   Vitamin B-12 354 232 - 1,245 pg/mL  Specimen status report     Status: None   Collection Time: 09/14/17 10:09 AM  Result Value Ref Range   specimen status report Comment     Comment: Ambig Abbrev LP Default Ambig Abbrev LP Default A hand-written panel/profile was received from your office. In accordance with the [facility] Ambiguous Test Code Policy dated July 2003, we have completed your order by using the closest currently or formerly recognized AMA panel.  We have assigned Lipid Panel, Test Code [phone] to this request. If this is not the testing you wished to receive on this specimen, please contact the [facility] Client Inquiry/Technical Services Department to clarify the test order.  We appreciate your business.    Objective  Body mass index is 30.31 kg/m. Wt Readings from Last 3 Encounters:  12/06/17 164 lb 6.4 oz (74.6 kg)  10/25/17 170 lb 9.6 oz (77.4 kg)  08/04/17 167 lb 3.2 oz (75.8 kg)   Temp Readings from Last 3 Encounters:  12/06/17 98.4 F (36.9 C) (Oral)  10/25/17 98.5 F (36.9 C) (Oral)  08/22/17 98.1 F (36.7 C) (Oral)   BP Readings from Last 3 Encounters:  12/06/17 112/80  10/25/17 118/70  08/22/17 128/84   Pulse Readings from Last 3 Encounters:  12/06/17 83  10/25/17 89  08/22/17 92    Physical Exam  Constitutional: She is oriented to person, place, and time. Vital signs are normal. She appears well-developed and well-nourished. She is cooperative.  HENT:  Head: Normocephalic and atraumatic.  Mouth/Throat: Oropharynx is clear and moist and mucous membranes are normal.  Eyes: Pupils are equal, round, and reactive to light. Conjunctivae are normal.  Cardiovascular: Normal rate, regular rhythm and normal heart sounds.  Pulmonary/Chest: Effort normal and breath sounds normal.  Neurological: She is alert and oriented to person, place, and time. Gait normal.  Skin: Skin is warm, dry and intact.  Psychiatric: She has a normal mood and affect.  Her speech is normal and behavior is normal. Judgment and thought content normal. Cognition and memory are normal.  Nursing note and vitals reviewed.   Assessment   1. ibs-c 2. Obesity >30 with wt loss  3. Melasma  4. Thyroid nodule 5. HM 6. Anxiety h/o depression  Plan  1. Trial of linzess 290 given 8 pills  2. adipex x 2 more months and f/u  3. Spf hold on hydroquinone for now  4. sch thyroid US  5.  Declines flu shot  Tdap per pt had 4 years ago at health dept need to confirm date  Get copy of labs CMET, CBC from Dr. Guinevere Ferrari 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 Cheree Ditto  -obtained labs and noted 10/25/17 Dr. Cheree Ditto rec f/u with her  Labs 12/09/16 CBC normal  lfts normal  quantiferon TB gold neg  HCV neg  Hep B  sAb qual nonreactive   prevnar had Pap neg 09/22/15 neg HPVdue 09/22/2018  6. Of note pt wants to take effexor 75 xr not 37.5  Provider: Dr. French Anaracy [facility]

## 2017-12-09 ENCOUNTER — Ambulatory Visit: Payer: BC Managed Care – PPO

## 2017-12-16 ENCOUNTER — Ambulatory Visit
Admission: RE | Admit: 2017-12-16 | Discharge: 2017-12-16 | Disposition: A | Discharge status: Home / Self Care | Payer: BC Managed Care – PPO | Source: Ambulatory Visit | Attending: Internal Medicine | Admitting: Internal Medicine

## 2017-12-16 DIAGNOSIS — E041 Nontoxic single thyroid nodule: Secondary | ICD-10-CM | POA: Diagnosis present

## 2018-01-16 ENCOUNTER — Encounter: Payer: Self-pay | Admitting: Internal Medicine

## 2018-01-16 ENCOUNTER — Ambulatory Visit: Payer: BC Managed Care – PPO | Admitting: Internal Medicine

## 2018-01-16 ENCOUNTER — Ambulatory Visit (INDEPENDENT_AMBULATORY_CARE_PROVIDER_SITE_OTHER): Payer: BC Managed Care – PPO | Admitting: Internal Medicine

## 2018-01-16 VITALS — BP 116/72 | HR 104 | Temp 99.3°F | Ht 61.75 in | Wt 164.0 lb

## 2018-01-16 DIAGNOSIS — L409 Psoriasis, unspecified: Secondary | ICD-10-CM | POA: Diagnosis not present

## 2018-01-16 DIAGNOSIS — E669 Obesity, unspecified: Secondary | ICD-10-CM | POA: Diagnosis not present

## 2018-01-16 DIAGNOSIS — K581 Irritable bowel syndrome with constipation: Secondary | ICD-10-CM | POA: Diagnosis not present

## 2018-01-16 DIAGNOSIS — E041 Nontoxic single thyroid nodule: Secondary | ICD-10-CM | POA: Diagnosis not present

## 2018-01-16 MED ORDER — PHENTERMINE HCL 37.5 MG PO TABS: 37.5000 mg | ORAL_TABLET | Freq: Every day | ORAL | 0 refills | Status: DC

## 2018-01-16 NOTE — Progress Notes (Signed)
Pre visit review using our clinic review tool, if applicable. No additional management support is needed unless otherwise documented below in the visit note. 

## 2018-01-16 NOTE — Patient Instructions (Signed)
Kefir yogurt with fruit and flaxseeds or chia seeds  Adipex x 2 more months  F/u in 4-5 months  Lighter dressings  Less carbs and sugary foods and calories and cardio more please    Mediterranean Diet A Mediterranean diet refers to food and lifestyle choices that are based on the traditions of countries located on the [employer]. This way of eating has been shown to help prevent certain conditions and improve outcomes for people who have chronic diseases, like kidney disease and heart disease. What are tips for following this plan? Lifestyle  Cook and eat meals together with your family, when possible.  Drink enough fluid to keep your urine clear or pale yellow.  Be physically active every day. This includes: ? Aerobic exercise like running or swimming. ? Leisure activities like gardening, walking, or housework.  Get 7-8 hours of sleep each night.  If recommended by your health care provider, drink red wine in moderation. This means 1 glass a day for nonpregnant women and 2 glasses a day for men. A glass of wine equals 5 oz (150 mL). Reading food labels  Check the serving size of packaged foods. For foods such as rice and pasta, the serving size refers to the amount of cooked product, not dry.  Check the total fat in packaged foods. Avoid foods that have saturated fat or trans fats.  Check the ingredients list for added sugars, such as corn syrup. Shopping  At the grocery store, buy most of your food from the areas near the walls of the store. This includes: ? Fresh fruits and vegetables (produce). ? Grains, beans, nuts, and seeds. Some of these may be available in unpackaged forms or large amounts (in bulk). ? Fresh seafood. ? Poultry and eggs. ? Low-fat dairy products.  Buy whole ingredients instead of prepackaged foods.  Buy fresh fruits and vegetables in-season from local farmers markets.  Buy frozen fruits and vegetables in resealable bags.  If you do not have  access to quality fresh seafood, buy precooked frozen shrimp or canned fish, such as tuna, salmon, or sardines.  Buy small amounts of raw or cooked vegetables, salads, or olives from the deli or salad bar at your store.  Stock your pantry so you always have certain foods on hand, such as olive oil, canned tuna, canned tomatoes, rice, pasta, and beans. Cooking  Cook foods with extra-virgin olive oil instead of using butter or other vegetable oils.  Have meat as a side dish, and have vegetables or grains as your main dish. This means having meat in small portions or adding small amounts of meat to foods like pasta or stew.  Use beans or vegetables instead of meat in common dishes like chili or lasagna.  Experiment with different cooking methods. Try roasting or broiling vegetables instead of steaming or sauteing them.  Add frozen vegetables to soups, stews, pasta, or rice.  Add nuts or seeds for added healthy fat at each meal. You can add these to yogurt, salads, or vegetable dishes.  Marinate fish or vegetables using olive oil, lemon juice, garlic, and fresh herbs. Meal planning  Plan to eat 1 vegetarian meal one day each week. Try to work up to 2 vegetarian meals, if possible.  Eat seafood 2 or more times a week.  Have healthy snacks readily available, such as: ? Vegetable sticks with hummus. ? [place] yogurt. ? Fruit and nut trail mix.  Eat balanced meals throughout the week. This includes: ? Fruit: 2-3  servings a day ? Vegetables: 4-5 servings a day ? Low-fat dairy: 2 servings a day ? Fish, poultry, or lean meat: 1 serving a day ? Beans and legumes: 2 or more servings a week ? Nuts and seeds: 1-2 servings a day ? Whole grains: 6-8 servings a day ? Extra-virgin olive oil: 3-4 servings a day  Limit red meat and sweets to only a few servings a month What are my food choices?  Mediterranean diet ? Recommended ? Grains: Whole-grain pasta. Brown rice. Bulgar wheat. Polenta.  Couscous. Whole-wheat bread. Orpah Cobb. ? Vegetables: Artichokes. Beets. Broccoli. Cabbage. Carrots. Eggplant. Green beans. Chard. Kale. Spinach. Onions. Leeks. Peas. Squash. Tomatoes. Peppers. Radishes. ? Fruits: Apples. Apricots. Avocado. Berries. Bananas. Cherries. Dates. Figs. Grapes. Lemons. Melon. Oranges. Peaches. Plums. Pomegranate. ? Meats and other protein foods: Beans. Almonds. Sunflower seeds. Pine nuts. Peanuts. Cod. Salmon. Scallops. Shrimp. Tuna. Tilapia. Clams. Oysters. Eggs. ? Dairy: Low-fat milk. Cheese. Greek yogurt. ? Beverages: Water. Red wine. Herbal tea. ? Fats and oils: Extra virgin olive oil. Avocado oil. Grape seed oil. ? Sweets and desserts: [place] yogurt with honey. Baked apples. Poached pears. Trail mix. ? Seasoning and other foods: Basil. Cilantro. Coriander. Cumin. Mint. Parsley. Sage. Rosemary. Tarragon. Garlic. Oregano. Thyme. Pepper. Balsalmic vinegar. Tahini. Hummus. Tomato sauce. Olives. Mushrooms. ? Limit these ? Grains: Prepackaged pasta or rice dishes. Prepackaged cereal with added sugar. ? Vegetables: Deep fried potatoes (french fries). ? Fruits: Fruit canned in syrup. ? Meats and other protein foods: Beef. Pork. Lamb. Poultry with skin. Hot dogs. Tomasa Blase. ? Dairy: Ice cream. Sour cream. Whole milk. ? Beverages: Juice. Sugar-sweetened soft drinks. Beer. Liquor and spirits. ? Fats and oils: Butter. Canola oil. Vegetable oil. Beef fat (tallow). Lard. ? Sweets and desserts: Cookies. Cakes. Pies. Candy. ? Seasoning and other foods: Mayonnaise. Premade sauces and marinades. ? The items listed may not be a complete list. Talk with your dietitian about what dietary choices are right for you. Summary  The Mediterranean diet includes both food and lifestyle choices.  Eat a variety of fresh fruits and vegetables, beans, nuts, seeds, and whole grains.  Limit the amount of red meat and sweets that you eat.  Talk with your health care provider about whether  it is safe for you to drink red wine in moderation. This means 1 glass a day for nonpregnant women and 2 glasses a day for men. A glass of wine equals 5 oz (150 mL). This information is not intended to replace advice given to you by your health care provider. Make sure you discuss any questions you have with your health care provider. Document Released: 01/01/2016 Document Revised: 02/03/2016 Document Reviewed: 01/01/2016 Elsevier Interactive Patient Education  2018 [employer].  Exercising to [employer] Exercising can help you to lose weight. In order to lose weight through exercise, you need to do vigorous-intensity exercise. You can tell that you are exercising with vigorous intensity if you are breathing very hard and fast and cannot hold a conversation while exercising. Moderate-intensity exercise helps to maintain your current weight. You can tell that you are exercising at a moderate level if you have a higher heart rate and faster breathing, but you are still able to hold a conversation. How often should I exercise? Choose an activity that you enjoy and set realistic goals. Your health care provider can help you to make an activity plan that works for you. Exercise regularly as directed by your health care provider. This may include:  Doing  resistance training twice each week, such as: ? Push-ups. ? Sit-ups. ? Lifting weights. ? Using resistance bands.  Doing a given intensity of exercise for a given amount of time. Choose from these options: ? 150 minutes of moderate-intensity exercise every week. ? 75 minutes of vigorous-intensity exercise every week. ? A mix of moderate-intensity and vigorous-intensity exercise every week.  Children, pregnant women, people who are out of shape, people who are overweight, and older adults may need to consult a health care provider for individual recommendations. If you have any sort of medical condition, be sure to consult your health care provider  before starting a new exercise program. What are some activities that can help me to lose weight?  Walking at a rate of at least 4.5 miles an hour.  Jogging or running at a rate of 5 miles per hour.  Biking at a rate of at least 10 miles per hour.  Lap swimming.  Roller-skating or in-line skating.  Cross-country skiing.  Vigorous competitive sports, such as football, basketball, and soccer.  Jumping rope.  Aerobic dancing. How can I be more active in my day-to-day activities?  Use the stairs instead of the elevator.  Take a walk during your lunch break.  If you drive, park your car farther away from work or school.  If you take public transportation, get off one stop early and walk the rest of the way.  Make all of your phone calls while standing up and walking around.  Get up, stretch, and walk around every 30 minutes throughout the day. What guidelines should I follow while exercising?  Do not exercise so much that you hurt yourself, feel dizzy, or get very short of breath.  Consult your health care provider prior to starting a new exercise program.  Wear comfortable clothes and shoes with good support.  Drink plenty of water while you exercise to prevent dehydration or heat stroke. Body water is lost during exercise and must be replaced.  Work out until you breathe faster and your heart beats faster. This information is not intended to replace advice given to you by your health care provider. Make sure you discuss any questions you have with your health care provider. Document Released: 06/12/2010 Document Revised: 10/16/2015 Document Reviewed: 10/11/2013 Elsevier Interactive Patient Education  [employer]2018 Elsevier Inc.

## 2018-01-16 NOTE — Progress Notes (Addendum)
No chief complaint on file.  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 Reviewed thyroid US +cysts no criteria to f/u  2. Weight loss still the same after 2 months adipex reports diet noncompliant at times and works out 45 minutes 4 days per week  3. Constipation did not try linzess tried fiber gummies which have helped  4. Psoriasis appt with Dr. Cheree Ditto tbd has to pay off pending balance   Review of Systems  Constitutional: Negative for weight loss.  HENT: Negative for hearing loss.   Eyes: Negative for blurred vision.  Respiratory: Negative for shortness of breath.   Cardiovascular: Negative for chest pain.  Skin: Negative for rash.  Neurological: Negative for headaches.  Psychiatric/Behavioral: Negative for depression.   Past Medical History:  Diagnosis Date  . Anxiety   . Complete miscarriage   . Depression    postpartum   . IBS (irritable bowel syndrome)    constipation  . Kidney stones   . Melasma    lip  . Psoriasis    on Humira Dr. Cheree Ditto   . UTI (urinary tract infection)    Past Surgical History:  Procedure Laterality Date  . CERVICAL BIOPSY    . KNEE SURGERY     cyst removal  . UTERINE SEPTUM RESECTION  04/2012   Dr. Samuella Cota at [facility] History  Problem Relation Age of Onset  . Rashes / Skin problems Mother   . Hyperlipidemia Father   . Rashes / Skin problems Father   . Rashes / Skin problems Sister    Social History   Socioeconomic History  . Marital status: Married    Spouse name: Not on file  . Number of children: Not on file  . Years of education: Not on file  . Highest education level: Not on file  Occupational History  . Not on file  Social Needs  . Financial resource strain: Not on file  . Food insecurity:    Worry: Not on file    Inability: Not on file  . Transportation needs:    Medical: Not on file    Non-medical: Not on file  Tobacco Use  . Smoking status: Never Smoker  . Smokeless tobacco: Never Used   Substance and Sexual Activity  . Alcohol use: No    Alcohol/week: 0.0 standard drinks  . Drug use: No  . Sexual activity: Yes    Partners: Male  Lifestyle  . Physical activity:    Days per week: Not on file    Minutes per session: Not on file  . Stress: Not on file  Relationships  . Social connections:    Talks on phone: Not on file    Gets together: Not on file    Attends religious service: Not on file    Active member of club or organization: Not on file    Attends meetings of clubs or organizations: Not on file    Relationship status: Not on file  . Intimate partner violence:    Fear of current or ex partner: Not on file    Emotionally abused: Not on file    Physically abused: Not on file    Forced sexual activity: Not on file  Other Topics Concern  . 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 Cholecalciferol (VITAMIN D3) 5000 units CAPS Take  by mouth.  . clonazePAM (KLONOPIN) 0.5 MG tablet Take 1 tablet (0.5 mg total) by mouth 2 (two) times daily as needed for anxiety. (Patient taking differently: Take 0.5 mg by mouth daily as needed for anxiety. )  . HUMIRA PEN 40 MG/0.8ML PNKT   . naproxen (NAPROSYN) 375 MG tablet Take 1 tablet (375 mg total) by mouth 2 (two) times daily with a meal.  . phentermine (ADIPEX-P) 37.5 MG tablet Take 1 tablet (37.5 mg total) by mouth daily before breakfast.  . venlafaxine XR (EFFEXOR-XR) 75 MG 24 hr capsule TAKE 1 CAPSULE BY MOUTH DAILY WITH BREAKFAST   No Known Allergies No results found for this or any previous visit (from the past 2160 hour(s)). Objective  Body mass index is 30.24 kg/m. Wt Readings from Last 3 Encounters:  01/16/18 164 lb (74.4 kg)  12/06/17 164 lb 6.4 oz (74.6 kg)  10/25/17 170 lb 9.6 oz (77.4 kg)   Temp Readings from Last 3 Encounters:  01/16/18 99.3 F (37.4 C) (Oral)  12/06/17 98.4 F (36.9 C) (Oral)  10/25/17 98.5 F (36.9 C)  (Oral)   BP Readings from Last 3 Encounters:  01/16/18 116/72  12/06/17 112/80  10/25/17 118/70   Pulse Readings from Last 3 Encounters:  01/16/18 (!) 104  12/06/17 83  10/25/17 89    Physical Exam  Constitutional: She is oriented to person, place, and time. Vital signs are normal. She appears well-developed and well-nourished. She is cooperative.  HENT:  Head: Normocephalic and atraumatic.  Mouth/Throat: Oropharynx is clear and moist and mucous membranes are normal.  Eyes: Pupils are equal, round, and reactive to light. Conjunctivae are normal.  Cardiovascular: Normal rate, regular rhythm and normal heart sounds.  Pulmonary/Chest: Effort normal and breath sounds normal.  Neurological: She is alert and oriented to person, place, and time. Gait normal.  Skin: Skin is warm, dry and intact.  Psychiatric: She has a normal mood and affect. Her speech is normal and behavior is normal. Judgment and thought content normal. Cognition and memory are normal.  Nursing note and vitals reviewed.   Assessment   1. Obesity bmi 30.24  2. IBS Constipation improved with fiber  3. Psoriasis  4. Thyroid cysts  5. HM Plan  1. Change lifestyle more cardio less carbs and sweets  adipex x 2 more months already completed 2/2 months  Disc intermittent fasting and chia and flaxseed seed use with shake  2. Monitor did not try linzess  3. F/u dermatology Dr. Cheree DittoGraham tbd appt  4. ntd  5.  Declines flu shot  Tdap per pt had 4 years ago at health dept need to confirm date  -not in [facility]  Get copy of labs CMET, CBC from Dr. Guinevere FerrariAnna Graham mebanerequested on humira for psoriais (see below) pending appt with dermatology today and will rec they do CMET, CBC  Hep B not immune 2017 check titer to confirm, checked again non reactive and had vaccine being she is on humira may make her less likely to take/make vaccination at this time less effective will disc with Dr. Cheree DittoGraham -obtained labs and noted 10/25/17 Dr.  Cheree DittoGraham rec f/u with her  Labs 12/09/16 CBC normal  lfts normal  quantiferon TB gold neg  HCV neg  Hep B sAb qual nonreactive  prevnar had Pap neg 09/22/15 neg HPVdue 09/22/2018  Minute clinic visit 05/14/18 for sore throat fever given tamiflu and ibuprofen  Provider: Dr. French Anaracy [facility]

## 2018-01-18 NOTE — Progress Notes (Signed)
Will need Tdap possibly no records

## 2018-01-18 NOTE — Progress Notes (Signed)
No record in [facility]

## 2018-07-11 ENCOUNTER — Other Ambulatory Visit: Payer: Self-pay | Admitting: Family Medicine

## 2018-07-11 DIAGNOSIS — F419 Anxiety disorder, unspecified: Secondary | ICD-10-CM

## 2018-07-12 ENCOUNTER — Encounter: Payer: Self-pay | Admitting: Internal Medicine

## 2018-07-12 ENCOUNTER — Other Ambulatory Visit: Payer: Self-pay | Admitting: Internal Medicine

## 2018-07-12 DIAGNOSIS — F419 Anxiety disorder, unspecified: Secondary | ICD-10-CM

## 2018-07-12 MED ORDER — VENLAFAXINE HCL ER 75 MG PO CP24: ORAL_CAPSULE | ORAL | 0 refills | Status: DC

## 2018-07-14 ENCOUNTER — Ambulatory Visit (INDEPENDENT_AMBULATORY_CARE_PROVIDER_SITE_OTHER): Payer: BC Managed Care – PPO | Admitting: Internal Medicine

## 2018-07-14 ENCOUNTER — Encounter: Payer: Self-pay | Admitting: Internal Medicine

## 2018-07-14 VITALS — BP 122/78 | HR 93 | Temp 98.6°F | Ht 61.75 in | Wt 172.0 lb

## 2018-07-14 DIAGNOSIS — F419 Anxiety disorder, unspecified: Secondary | ICD-10-CM | POA: Diagnosis not present

## 2018-07-14 DIAGNOSIS — L409 Psoriasis, unspecified: Secondary | ICD-10-CM

## 2018-07-14 DIAGNOSIS — E669 Obesity, unspecified: Secondary | ICD-10-CM

## 2018-07-14 DIAGNOSIS — E559 Vitamin D deficiency, unspecified: Secondary | ICD-10-CM | POA: Diagnosis not present

## 2018-07-14 DIAGNOSIS — G4733 Obstructive sleep apnea (adult) (pediatric): Secondary | ICD-10-CM | POA: Diagnosis not present

## 2018-07-14 MED ORDER — CLONAZEPAM 0.5 MG PO TABS: 0.5000 mg | ORAL_TABLET | Freq: Every day | ORAL | 0 refills | Status: DC | PRN

## 2018-07-14 MED ORDER — VENLAFAXINE HCL ER 75 MG PO CP24: ORAL_CAPSULE | ORAL | 3 refills | Status: DC

## 2018-07-14 NOTE — Progress Notes (Addendum)
Chief Complaint  Patient presents with  . Sleep Apnea   F/u  1. C/w OSA still has tonsils she is gasping for air at night and tired during the daytime and her loud noise in ears a few weeks ago woke up gasping for air. Sx's worse x 1 year  2. Psoriasis diffuse now on Skyrizi x 1 month ago started and changed to Dr. Vickki Muff instead of Dr. Phillip Heal  3. Vit D def not taking vitamin D rec 5000 IU daily  4. Weight gain of 8 lsb since last visit    Review of Systems  Constitutional: Positive for malaise/fatigue. Negative for weight loss.  HENT: Negative for hearing loss.   Eyes: Negative for blurred vision.  Respiratory: Negative for shortness of breath.   Cardiovascular: Negative for chest pain.  Gastrointestinal: Negative for abdominal pain.  Skin: Positive for rash.  Neurological: Negative for headaches.       C/w sleep apnea    Past Medical History:  Diagnosis Date  . Anxiety   . Complete miscarriage   . Depression    postpartum   . IBS (irritable bowel syndrome)    constipation  . Kidney stones   . Melasma    lip  . Psoriasis    on Humira Dr. Phillip Heal   . UTI (urinary tract infection)    Past Surgical History:  Procedure Laterality Date  . CERVICAL BIOPSY    . KNEE SURGERY     cyst removal  . UTERINE SEPTUM RESECTION  04/2012   Dr. March Rummage at [facility] History  Problem Relation Age of Onset  . Rashes / Skin problems Mother   . Hyperlipidemia Father   . Rashes / Skin problems Father   . Rashes / Skin problems Sister    Social History   Socioeconomic History  . Marital status: Married    Spouse name: Not on file  . Number of children: Not on file  . Years of education: Not on file  . Highest education level: Not on file  Occupational History  . Not on file  Social Needs  . Financial resource strain: Not on file  . Food insecurity:    Worry: Not on file    Inability: Not on file  . Transportation needs:    Medical: Not on file    Non-medical:  Not on file  Tobacco Use  . Smoking status: Never Smoker  . Smokeless tobacco: Never Used  Substance and Sexual Activity  . Alcohol use: No    Alcohol/week: 0.0 standard drinks  . Drug use: No  . Sexual activity: Yes    Partners: Male  Lifestyle  . Physical activity:    Days per week: Not on file    Minutes per session: Not on file  . Stress: Not on file  Relationships  . Social connections:    Talks on phone: Not on file    Gets together: Not on file    Attends religious service: Not on file    Active member of club or organization: Not on file    Attends meetings of clubs or organizations: Not on file    Relationship status: Not on file  . Intimate partner violence:    Fear of current or ex partner: Not on file    Emotionally abused: Not on file    Physically abused: Not on file    Forced sexual activity: Not on file  Other Topics Concern  . 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 Cholecalciferol (VITAMIN D3) 5000 units CAPS Take by mouth.  . clonazePAM (KLONOPIN) 0.5 MG tablet Take 1 tablet (0.5 mg total) by mouth 2 (two) times daily as needed for anxiety. (Patient taking differently: Take 0.5 mg by mouth daily as needed for anxiety. )  . SKYRIZI, 150 MG DOSE, 75 MG/0.83ML PSKT   . venlafaxine XR (EFFEXOR-XR) 75 MG 24 hr capsule TAKE ONE CAPSULE BY MOUTH DAILY WITH BREAKFAST   No Known Allergies No results found for this or any previous visit (from the past 2160 hour(s)). Objective  Body mass index is 31.71 kg/m. Wt Readings from Last 3 Encounters:  07/14/18 172 lb (78 kg)  01/16/18 164 lb (74.4 kg)  12/06/17 164 lb 6.4 oz (74.6 kg)   Temp Readings from Last 3 Encounters:  07/14/18 98.6 F (37 C) (Oral)  01/16/18 99.3 F (37.4 C) (Oral)  12/06/17 98.4 F (36.9 C) (Oral)   BP Readings from Last 3 Encounters:  07/14/18 122/78  01/16/18 116/72  12/06/17 112/80   Pulse Readings  from Last 3 Encounters:  07/14/18 93  01/16/18 (!) 104  12/06/17 83    Physical Exam Vitals signs and nursing note reviewed.  Constitutional:      Appearance: Normal appearance. She is well-developed and well-groomed. She is obese.  HENT:     Head: Normocephalic and atraumatic.     Nose: Nose normal.     Mouth/Throat:     Mouth: Mucous membranes are moist.     Pharynx: Oropharynx is clear.  Eyes:     Conjunctiva/sclera: Conjunctivae normal.     Pupils: Pupils are equal, round, and reactive to light.  Cardiovascular:     Rate and Rhythm: Normal rate and regular rhythm.     Heart sounds: Normal heart sounds.  Pulmonary:     Effort: Pulmonary effort is normal.     Breath sounds: Normal breath sounds.  Skin:    General: Skin is warm and dry.  Neurological:     General: No focal deficit present.     Mental Status: She is alert and oriented to person, place, and time. Mental status is at baseline.     Gait: Gait normal.  Psychiatric:        Attention and Perception: Attention and perception normal.        Mood and Affect: Mood and affect normal.        Speech: Speech normal.        Behavior: Behavior normal. Behavior is cooperative.        Thought Content: Thought content normal.        Cognition and Memory: Cognition and memory normal.        Judgment: Judgment normal.     Assessment   1. C/w OSA tonsils enlarged as well  2. Psoriasis  3. Vit D def  4. Obesity  5. Anxiety 6. HM Plan   1. Referred pulm for home sleep study  Consider ENT if tonsils etiology of sleep apnea medium sized tonsils  rec wt loss  2. F/u dermatology [facility] up[facility] appt in 10/2018 get copy  Of labs  3. D3 5000 iu daily  4. rec healthy diet and exercise  5. Refilled effexor and klonopin prn  6.  Declines flu shot  Tdap per pt had 4 years ago at health dept need to confirm  date  -not in [place] get records [facility] had 08/25/10  -prevnar had  Had MMR and hep B vaccines  Varicella  disease had in 1991  Hep B not immune 2017 check titer to confirm, checked again non reactive and had vaccine being she is on humira may make her less likely to take/make vaccination at this time less effective will disc with Dr. Phillip Heal Pap neg 09/22/15 neg HPVdue 5/1/2020will do at f/u    Minute clinic visit 05/14/18 for sore throat fever given tamiflu and ibuprofen  Provider: Dr. Olivia Mackie McLean-Scocuzza-[facility] Medicine

## 2018-07-14 NOTE — Patient Instructions (Addendum)
vitamin D3 5000 IU daily  Multivitamin with iron (might not be best idea) or with colace stool softner    Neutrogena T Sal Shampoo for hair, steroid solution scalp as needed    Sleep Apnea Sleep apnea is a condition in which breathing pauses or becomes shallow during sleep. Episodes of sleep apnea usually last 10 seconds or longer, and they may occur as many as 20 times an hour. Sleep apnea disrupts your sleep and keeps your body from getting the rest that it needs. This condition can increase your risk of certain health problems, including:  Heart attack.  Stroke.  Obesity.  Diabetes.  Heart failure.  Irregular heartbeat. There are three kinds of sleep apnea:  Obstructive sleep apnea. This kind is caused by a blocked or collapsed airway.  Central sleep apnea. This kind happens when the part of the brain that controls breathing does not send the correct signals to the muscles that control breathing.  Mixed sleep apnea. This is a combination of obstructive and central sleep apnea. What are the causes? The most common cause of this condition is a collapsed or blocked airway. An airway can collapse or become blocked if:  Your throat muscles are abnormally relaxed.  Your tongue and tonsils are larger than normal.  You are overweight.  Your airway is smaller than normal. What increases the risk? This condition is more likely to develop in people who:  Are overweight.  Smoke.  Have a smaller than normal airway.  Are elderly.  Are female.  Drink alcohol.  Take sedatives or tranquilizers.  Have a family history of sleep apnea. What are the signs or symptoms? Symptoms of this condition include:  Trouble staying asleep.  Daytime sleepiness and tiredness.  Irritability.  Loud snoring.  Morning headaches.  Trouble concentrating.  Forgetfulness.  Decreased interest in sex.  Unexplained sleepiness.  Mood swings.  Personality changes.  Feelings of  depression.  Waking up often during the night to urinate.  Dry mouth.  Sore throat. How is this diagnosed? This condition may be diagnosed with:  A medical history.  A physical exam.  A series of tests that are done while you are sleeping (sleep study). These tests are usually done in a sleep lab, but they may also be done at home. How is this treated? Treatment for this condition aims to restore normal breathing and to ease symptoms during sleep. It may involve managing health issues that can affect breathing, such as high blood pressure or obesity. Treatment may include:  Sleeping on your side.  Using a decongestant if you have nasal congestion.  Avoiding the use of depressants, including alcohol, sedatives, and narcotics.  Losing weight if you are overweight.  Making changes to your diet.  Quitting smoking.  Using a device to open your airway while you sleep, such as: ? An oral appliance. This is a custom-made mouthpiece that shifts your lower jaw forward. ? A continuous positive airway pressure (CPAP) device. This device delivers oxygen to your airway through a mask. ? A nasal expiratory positive airway pressure (EPAP) device. This device has valves that you put into each nostril. ? A bi-level positive airway pressure (BPAP) device. This device delivers oxygen to your airway through a mask.  Surgery if other treatments do not work. During surgery, excess tissue is removed to create a wider airway. It is important to get treatment for sleep apnea. Without treatment, this condition can lead to:  High blood pressure.  Coronary artery disease.  (  Men) An inability to achieve or maintain an erection (impotence).  Reduced thinking abilities. Follow these instructions at home:  Make any lifestyle changes that your health care provider recommends.  Eat a healthy, well-balanced diet.  Take over-the-counter and prescription medicines only as told by your health care  provider.  Avoid using depressants, including alcohol, sedatives, and narcotics.  Take steps to lose weight if you are overweight.  If you were given a device to open your airway while you sleep, use it only as told by your health care provider.  Do not use any tobacco products, such as cigarettes, chewing tobacco, and e-cigarettes. If you need help quitting, ask your health care provider.  Keep all follow-up visits as told by your health care provider. This is important. Contact a health care provider if:  The device that you received to open your airway during sleep is uncomfortable or does not seem to be working.  Your symptoms do not improve.  Your symptoms get worse. Get help right away if:  You develop chest pain.  You develop shortness of breath.  You develop discomfort in your back, arms, or stomach.  You have trouble speaking.  You have weakness on one side of your body.  You have drooping in your face. These symptoms may represent a serious problem that is an emergency. Do not wait to see if the symptoms will go away. Get medical help right away. Call your local emergency services (911 in the [place] Do not drive yourself to the hospital. This information is not intended to replace advice given to you by your health care provider. Make sure you discuss any questions you have with your health care provider. Document Released: 04/30/2002 Document Revised: 12/06/2016 Document Reviewed: 02/17/2015 Elsevier Interactive Patient Education  2019 [employer].

## 2018-07-14 NOTE — Progress Notes (Signed)
Pre visit review using our clinic review tool, if applicable. No additional management support is needed unless otherwise documented below in the visit note. 

## 2018-07-23 ENCOUNTER — Encounter: Payer: Self-pay | Admitting: Internal Medicine

## 2018-07-24 ENCOUNTER — Other Ambulatory Visit: Payer: Self-pay | Admitting: Internal Medicine

## 2018-07-24 DIAGNOSIS — E669 Obesity, unspecified: Secondary | ICD-10-CM

## 2018-07-24 MED ORDER — PHENTERMINE HCL 37.5 MG PO TABS: 37.5000 mg | ORAL_TABLET | Freq: Every day | ORAL | 0 refills | Status: DC

## 2018-09-26 ENCOUNTER — Encounter: Payer: Self-pay | Admitting: Internal Medicine

## 2018-09-26 ENCOUNTER — Ambulatory Visit: Payer: BC Managed Care – PPO | Admitting: Internal Medicine

## 2018-10-03 ENCOUNTER — Other Ambulatory Visit: Payer: Self-pay | Admitting: Internal Medicine

## 2018-10-03 DIAGNOSIS — E669 Obesity, unspecified: Secondary | ICD-10-CM

## 2018-10-03 MED ORDER — PHENTERMINE HCL 37.5 MG PO TABS: 37.5000 mg | ORAL_TABLET | Freq: Every day | ORAL | 0 refills | Status: DC

## 2018-12-19 ENCOUNTER — Encounter: Payer: Self-pay | Admitting: Internal Medicine

## 2018-12-19 ENCOUNTER — Ambulatory Visit (INDEPENDENT_AMBULATORY_CARE_PROVIDER_SITE_OTHER): Payer: BC Managed Care – PPO | Admitting: Internal Medicine

## 2018-12-19 ENCOUNTER — Other Ambulatory Visit: Payer: Self-pay

## 2018-12-19 DIAGNOSIS — E559 Vitamin D deficiency, unspecified: Secondary | ICD-10-CM | POA: Diagnosis not present

## 2018-12-19 DIAGNOSIS — Z1329 Encounter for screening for other suspected endocrine disorder: Secondary | ICD-10-CM

## 2018-12-19 DIAGNOSIS — R110 Nausea: Secondary | ICD-10-CM

## 2018-12-19 DIAGNOSIS — F419 Anxiety disorder, unspecified: Secondary | ICD-10-CM

## 2018-12-19 DIAGNOSIS — Z0000 Encounter for general adult medical examination without abnormal findings: Secondary | ICD-10-CM

## 2018-12-19 DIAGNOSIS — Z13220 Encounter for screening for lipoid disorders: Secondary | ICD-10-CM

## 2018-12-19 MED ORDER — CLONAZEPAM 0.5 MG PO TABS: 0.5000 mg | ORAL_TABLET | Freq: Every day | ORAL | 2 refills | Status: DC | PRN

## 2018-12-19 MED ORDER — PROMETHAZINE HCL 12.5 MG PO TABS: 12.5000 mg | ORAL_TABLET | Freq: Two times a day (BID) | ORAL | 0 refills | Status: DC | PRN

## 2018-12-19 MED ORDER — ONDANSETRON HCL 4 MG PO TABS: 4.0000 mg | ORAL_TABLET | Freq: Three times a day (TID) | ORAL | 0 refills | Status: DC | PRN

## 2018-12-19 NOTE — Patient Instructions (Signed)
Nausea, Adult Nausea is the feeling that you have an upset stomach or that you are about to vomit. Nausea on its own is not usually a serious concern, but it may be an early sign of a more serious medical problem. As nausea gets worse, it can lead to vomiting. If vomiting develops, or if you are not able to drink enough fluids, you are at risk of becoming dehydrated. Dehydration can make you tired and thirsty, cause you to have a dry mouth, and decrease how often you urinate. Older adults and people with other diseases or a weak disease-fighting system (immune system) are at higher risk for dehydration. The main goals of treating your nausea are:  To relieve your nausea.  To limit repeated nausea episodes.  To prevent vomiting and dehydration. Follow these instructions at home: Watch your symptoms for any changes. Tell your health care provider about them. Follow these instructions as told by your health care provider. Eating and drinking      Take an oral rehydration solution (ORS). This is a drink that is sold at pharmacies and retail stores.  Drink clear fluids slowly and in small amounts as you are able. Clear fluids include water, ice chips, low-calorie sports drinks, and fruit juice that has water added (diluted fruit juice).  Eat bland, easy-to-digest foods in small amounts as you are able. These foods include bananas, applesauce, rice, lean meats, toast, and crackers.  Avoid drinking fluids that contain a lot of sugar or caffeine, such as energy drinks, sports drinks, and soda.  Avoid alcohol.  Avoid spicy or fatty foods. General instructions  Take over-the-counter and prescription medicines only as told by your health care provider.  Rest at home while you recover.  Drink enough fluid to keep your urine pale yellow.  Breathe slowly and deeply when you feel nauseous.  Avoid smelling things that have strong odors.  Wash your hands often using soap and water. If soap and  water are not available, use hand sanitizer.  Make sure that all people in your household wash their hands well and often.  Keep all follow-up visits as told by your health care provider. This is important. Contact a health care provider if:  Your nausea gets worse.  Your nausea does not go away after two days.  You vomit.  You cannot drink fluids without vomiting.  You have any of the following: ? New symptoms. ? A fever. ? A headache. ? Muscle cramps. ? A rash. ? Pain while urinating.  You feel light-headed or dizzy. Get help right away if:  You have pain in your chest, neck, arm, or jaw.  You feel extremely weak or you faint.  You have vomit that is bright red or looks like coffee grounds.  You have bloody or black stools or stools that look like tar.  You have a severe headache, a stiff neck, or both.  You have severe pain, cramping, or bloating in your abdomen.  You have difficulty breathing or are breathing very quickly.  Your heart is beating very quickly.  Your skin feels cold and clammy.  You feel confused.  You have signs of dehydration, such as: ? Dark urine, very little urine, or no urine. ? Cracked lips. ? Dry mouth. ? Sunken eyes. ? Sleepiness. ? Weakness. These symptoms may represent a serious problem that is an emergency. Do not wait to see if the symptoms will go away. Get medical help right away. Call your local emergency services (911   in the [place] Do not drive yourself to the hospital. Summary  Nausea is the feeling that you have an upset stomach or that you are about to vomit. Nausea on its own is not usually a serious concern, but it may be an early sign of a more serious medical problem.  If vomiting develops, or if you are not able to drink enough fluids, you are at risk of becoming dehydrated.  Follow recommendations for eating and drinking and take over-the-counter and prescription medicines only as told by your health care  provider.  Contact a health care provider right away if your symptoms worsen or you have new symptoms.  Keep all follow-up visits as told by your health care provider. This is important. This information is not intended to replace advice given to you by your health care provider. Make sure you discuss any questions you have with your health care provider. Document Released: 06/17/2004 Document Revised: 10/18/2017 Document Reviewed: 10/18/2017 Elsevier Patient Education  2020 [employer]  

## 2018-12-19 NOTE — Progress Notes (Signed)
Virtual Visit via Video Note  I connected with Terri Andrews  on 12/19/18 at  8:38 AM EDT by a video enabled telemedicine application and verified that I am speaking with the correct person using two identifiers.  Location patient: home Location provider:work or home office Persons participating in the virtual visit: patient, provider, pts kids  I discussed the limitations of evaluation and management by telemedicine and the availability of in person appointments. The patient expressed understanding and agreed to proceed.   HPI: 1. Nausea since 11/25/2018 intermittently then felt better. She had nausea x 5 days week of 11/25/2018 and noticed it restarted 12/15/2018 w/o vomiting. Urine preg negative and currently on her cycle . No sx's or exposure to COVID and she noticed etoh and or menstrual cycle as possible trigger. She feels like she is "car sick" but the room is not spinning, no ab pain. No sx's of GERD or vertigo, no fever, constipation, diarrhea, belching. She tried Dramamine, pepcid which helped. She also tried [place] dry ginger ale and coke.    CT scan 05/11/11 reviewed with h/o ureteral stone   2. Anxiety increased since quarantine and wants refill of klonopin. Also on effexor reviewed 30% chance of nausea but not had this sx's prior    ROS: See pertinent positives and negatives per HPI.  Past Medical History:  Diagnosis Date  . Anxiety   . Complete miscarriage   . Depression    postpartum   . IBS (irritable bowel syndrome)    constipation  . Kidney stones   . Melasma    lip  . Psoriasis    on Humira Dr. Phillip Heal   . UTI (urinary tract infection)     Past Surgical History:  Procedure Laterality Date  . CERVICAL BIOPSY    . KNEE SURGERY     cyst removal  . UTERINE SEPTUM RESECTION  04/2012   Dr. March Rummage at [facility] History  Problem Relation Age of Onset  . Rashes / Skin problems Mother   . Hyperlipidemia Father   . Rashes / Skin problems Father   . Rashes /  Skin problems Sister     SOCIAL HX:  Married kids kids     Current Outpatient Medications:  .  Cholecalciferol (VITAMIN D3) 5000 units CAPS, Take by mouth., Disp: , Rfl:  .  clonazePAM (KLONOPIN) 0.5 MG tablet, Take 1 tablet (0.5 mg total) by mouth daily as needed for anxiety., Disp: 30 tablet, Rfl: 2 .  phentermine (ADIPEX-P) 37.5 MG tablet, Take 1 tablet (37.5 mg total) by mouth daily before breakfast., Disp: 60 tablet, Rfl: 0 .  SKYRIZI, 150 MG DOSE, 75 MG/0.83ML PSKT, 2 doses 0 month, 1 month then every 3 months  Dr. Kellie Moor, Disp: , Rfl:  .  venlafaxine XR (EFFEXOR-XR) 75 MG 24 hr capsule, TAKE ONE CAPSULE BY MOUTH DAILY WITH BREAKFAST, Disp: 90 capsule, Rfl: 3 .  ondansetron (ZOFRAN) 4 MG tablet, Take 1 tablet (4 mg total) by mouth every 8 (eight) hours as needed for nausea or vomiting., Disp: 40 tablet, Rfl: 0 .  promethazine (PHENERGAN) 12.5 MG tablet, Take 1 tablet (12.5 mg total) by mouth 2 (two) times daily as needed for nausea or vomiting., Disp: 30 tablet, Rfl: 0  EXAM:  VITALS per patient if applicable:  GENERAL: alert, oriented, appears well and in no acute distress  HEENT: atraumatic, conjunttiva clear, no obvious abnormalities on inspection of external nose and ears  NECK: normal movements of the head  and neck  LUNGS: on inspection no signs of respiratory distress, breathing rate appears normal, no obvious gross SOB, gasping or wheezing  CV: no obvious cyanosis  MS: moves all visible extremities without noticeable abnormality  PSYCH/NEURO: pleasant and cooperative, no obvious depression or anxiety, speech and thought processing grossly intact  ASSESSMENT AND PLAN:  Discussed the following assessment and plan:  Nausea - Plan: ondansetron (ZOFRAN) 4 MG tablet alternate with promethazine (PHENERGAN) 12.5 MG tablet, Comprehensive metabolic panel, CBC w/Diff, Urinalysis, Routine w reflex microscopic, US Abdomen Complete r/o GI vs GU etiology GB vs GU stones   -disc consider otc ppi as well to see if helps and let me know by 12/22/18 how she is feeling   Anxiety - Plan: clonazePAM (KLONOPIN) 0.5 MG tablet, rec use sparingly   Vitamin D deficiency - Plan: Vitamin D (25 hydroxy), on 5000 IU qd recheck to make sure vitamin D not elevated   HM Declines flu shot  Tdaphad 08/25/10  -prevnar had  Had MMR and hep B vaccines  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 Phillip Heal Pap neg 09/22/15 neg HPVdue 09/22/2018\ -will do at f/u    I discussed the assessment and treatment plan with the patient. The patient was provided an opportunity to ask questions and all were answered. The patient agreed with the plan and demonstrated an understanding of the instructions.   The patient was advised to call back or seek an in-person evaluation if the symptoms worsen or if the condition fails to improve as anticipated.  Time spent 25 minute s Delorise Jackson, MD

## 2019-02-22 ENCOUNTER — Ambulatory Visit: Payer: BC Managed Care – PPO | Admitting: Internal Medicine

## 2019-03-02 IMAGING — CT CT CERVICAL SPINE W/O CM
4 of 7 series · 16 of 33 positions shown, 17 images · non-contrast
Comparison: None.

CLINICAL DATA: Dizziness and confusion after [REDACTED] fell on
patient's head.

EXAM:
CT HEAD WITHOUT CONTRAST
CT CERVICAL SPINE WITHOUT CONTRAST
TECHNIQUE: Multidetector CT imaging of the head and cervical spine was
performed following the standard protocol without intravenous
contrast. Multiplanar CT image reconstructions of the cervical spine
were also generated.

[Series 5: head 3.0 mpr cor · coronal · 0.33mm/px · 3 of 67 slices shown]
[im 17/67  bone]
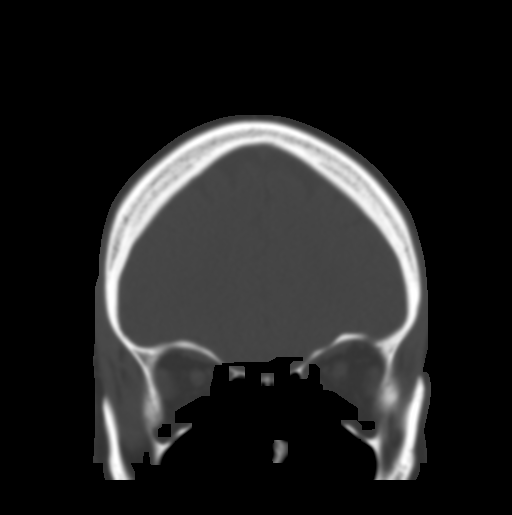
[im 34/67  bone]
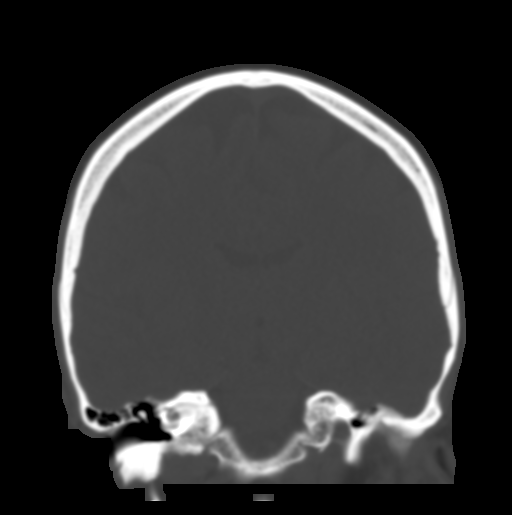
[im 50/67  bone]
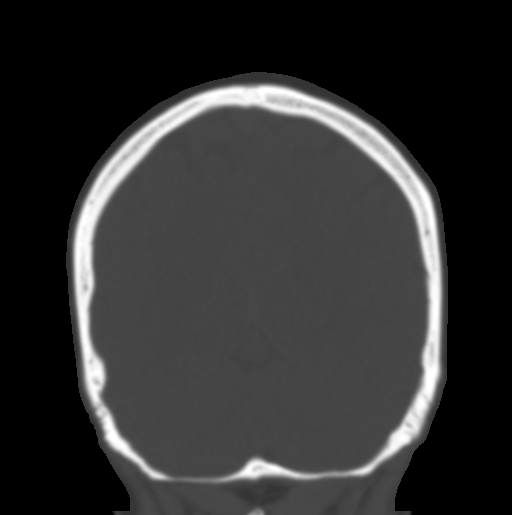

[Series 8: c_spine 2.0 st · axial · 0.33mm/px · z∈[-187,-87]mm · 4 of 84 slices shown, 5 images]
[im 17/84  soft-tissue]
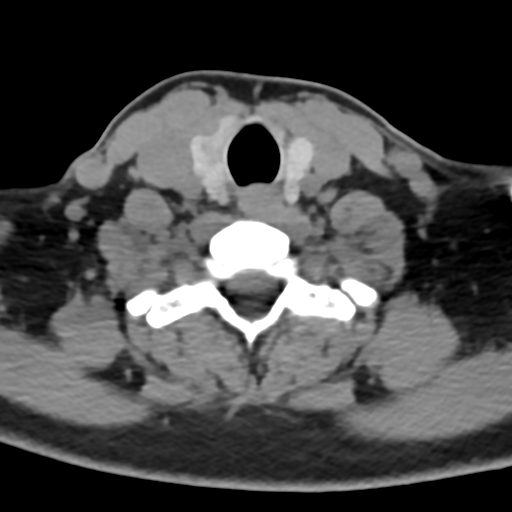
[im 17/84  bone]
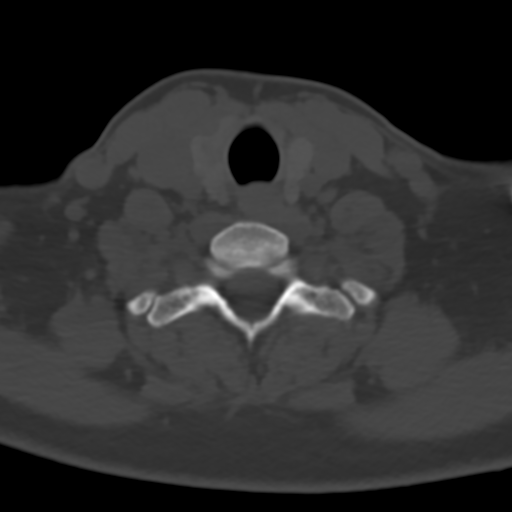
[im 34/84  bone]
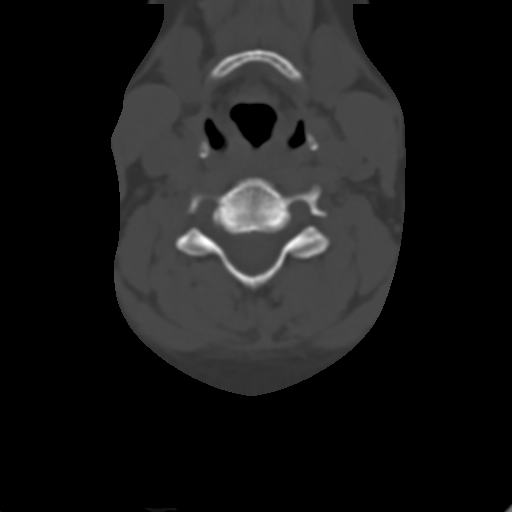
[im 50/84  bone]
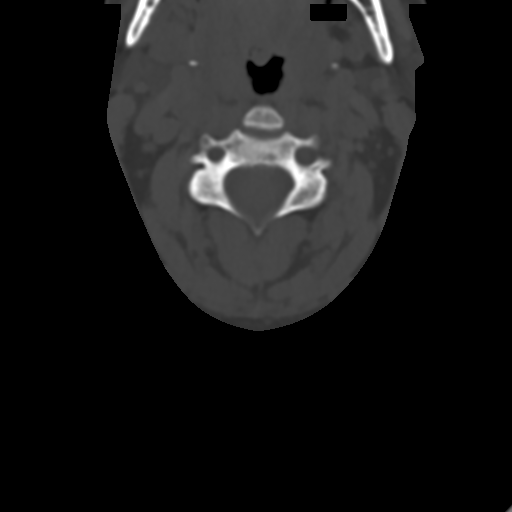
[im 67/84  bone]
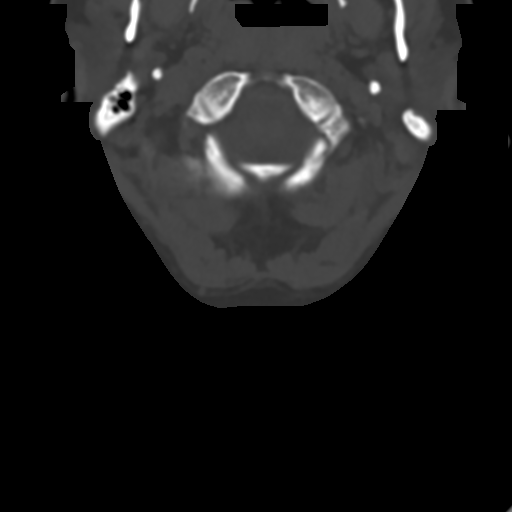

[Series 12: sagittal bone · sagittal · 0.27mm/px · 5 of 61 slices shown]
[im 11/61  bone]
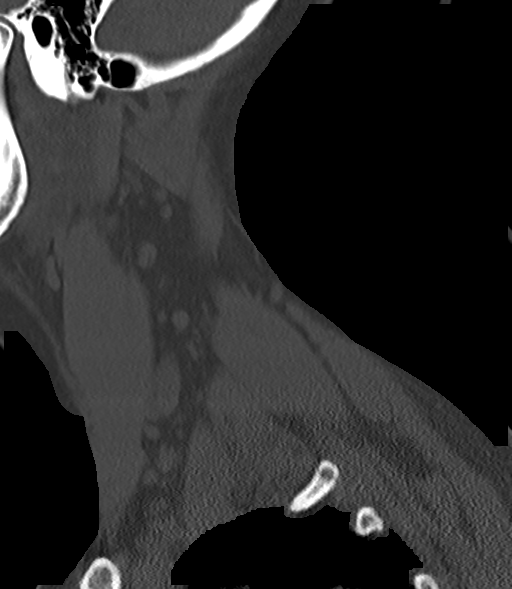
[im 21/61  bone]
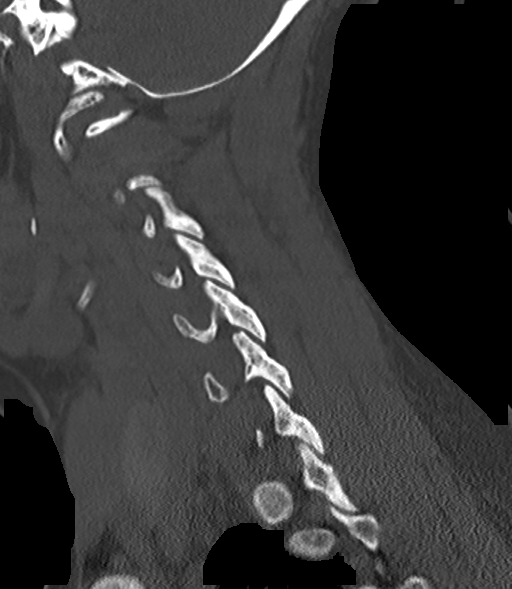
[im 31/61  bone]
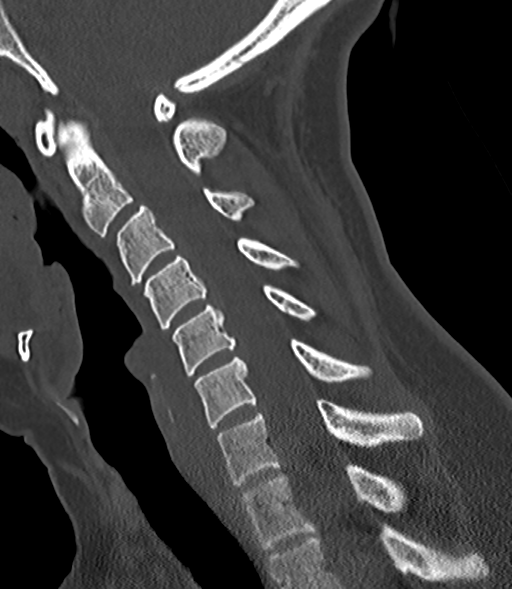
[im 41/61  bone]
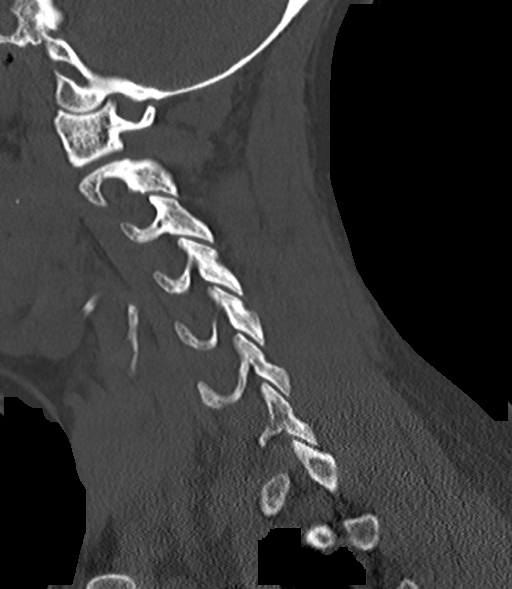
[im 51/61  bone]
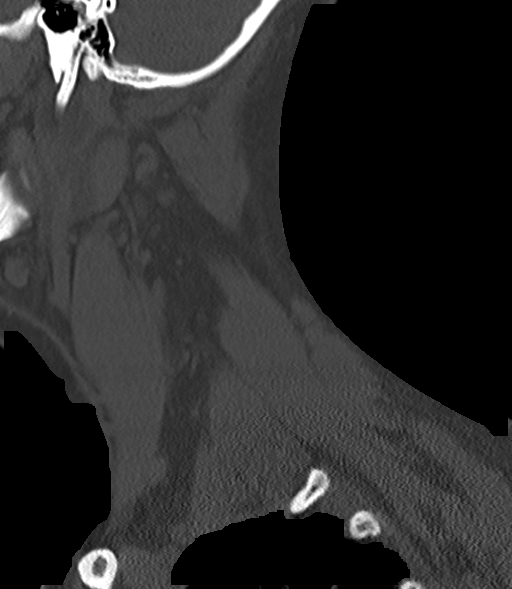

[Series 15: orthogonal axial st · axial · 0.21mm/px · z∈[-204,-113]mm · 4 of 86 slices shown]
[im 18/86  bone]
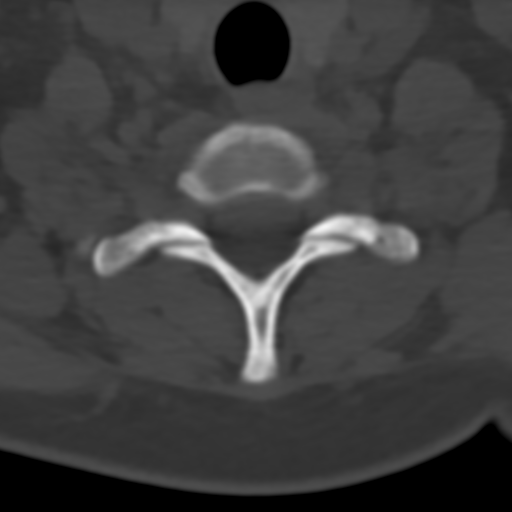
[im 35/86  bone]
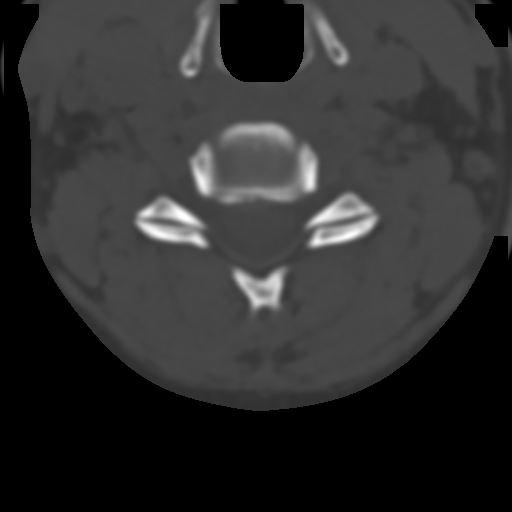
[im 52/86  bone]
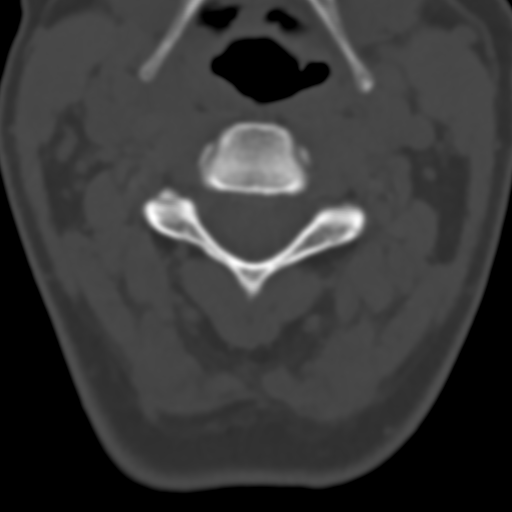
[im 69/86  bone]
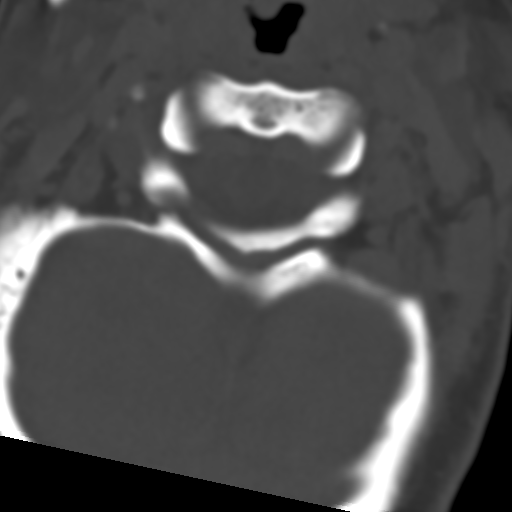

[16 of 33 positions shown; findings below may reference images not displayed]

FINDINGS: CT HEAD FINDINGS

BRAIN: No intraparenchymal hemorrhage, mass effect nor midline
shift. The ventricles and sulci are normal. No acute large vascular
territory infarcts. No abnormal extra-axial fluid collections. Basal
cisterns are patent.

VASCULAR: Unremarkable.

SKULL/SOFT TISSUES: No skull fracture. No significant soft tissue
swelling.

ORBITS/SINUSES: The included ocular globes and orbital contents are
normal.The mastoid aircells and included paranasal sinuses are
well-aerated.

OTHER: None.

CT CERVICAL SPINE FINDINGS

ALIGNMENT: Straightened lordosis.  Vertebral bodies in alignment.

SKULL BASE AND VERTEBRAE: Cervical vertebral bodies and posterior
elements are intact. Intervertebral disc heights preserved, minimal
endplate spurring and uncovertebral hypertrophy. No destructive bony
lesions. C1-2 articulation maintained.

SOFT TISSUES AND SPINAL CANAL: Nonacute. Subcentimeter nodule RIGHT
thyroid, below size followup recommendations.

DISC LEVELS: No significant osseous canal stenosis or neural
foraminal narrowing.

UPPER CHEST: Lung apices are clear.

OTHER: None.
IMPRESSION: CT HEAD: Normal noncontrast CT HEAD.

CT CERVICAL SPINE: No fracture or malalignment.

## 2019-03-20 ENCOUNTER — Other Ambulatory Visit: Payer: Self-pay

## 2019-03-20 ENCOUNTER — Encounter: Payer: Self-pay | Admitting: Internal Medicine

## 2019-03-20 ENCOUNTER — Ambulatory Visit: Payer: BC Managed Care – PPO | Admitting: Internal Medicine

## 2019-03-20 VITALS — BP 110/82 | HR 94 | Temp 97.8°F | Ht 61.75 in | Wt 160.0 lb

## 2019-03-20 DIAGNOSIS — Z30011 Encounter for initial prescription of contraceptive pills: Secondary | ICD-10-CM

## 2019-03-20 DIAGNOSIS — F419 Anxiety disorder, unspecified: Secondary | ICD-10-CM

## 2019-03-20 DIAGNOSIS — F329 Major depressive disorder, single episode, unspecified: Secondary | ICD-10-CM

## 2019-03-20 MED ORDER — VENLAFAXINE HCL ER 37.5 MG PO CP24: ORAL_CAPSULE | ORAL | 3 refills | Status: DC

## 2019-03-20 MED ORDER — NORGESTIM-ETH ESTRAD TRIPHASIC 0.18/0.215/0.25 MG-35 MCG PO TABS: 1.0000 | ORAL_TABLET | Freq: Every day | ORAL | 4 refills | Status: DC

## 2019-03-20 MED ORDER — BUPROPION HCL ER (XL) 150 MG PO TB24: 150.0000 mg | ORAL_TABLET | Freq: Every day | ORAL | 2 refills | Status: DC

## 2019-03-20 NOTE — Patient Instructions (Addendum)
Bupropion extended-release tablets (Depression/Mood Disorders) What is this medicine? BUPROPION (byoo PROE pee on) is used to treat depression. This medicine may be used for other purposes; ask your health care provider or pharmacist if you have questions. COMMON BRAND NAME(S): Aplenzin, Budeprion XL, Forfivo XL, Wellbutrin XL What should I tell my health care provider before I take this medicine? They need to know if you have any of these conditions:  an eating disorder, such as anorexia or bulimia  bipolar disorder or psychosis  diabetes or high blood sugar, treated with medication  glaucoma  head injury or brain tumor  heart disease, previous heart attack, or irregular heart beat  high blood pressure  kidney or liver disease  seizures (convulsions)  suicidal thoughts or a previous suicide attempt  Tourette's syndrome  weight loss  an unusual or allergic reaction to bupropion, other medicines, foods, dyes, or preservatives  breast-feeding  pregnant or trying to become pregnant How should I use this medicine? Take this medicine by mouth with a glass of water. Follow the directions on the prescription label. You can take it with or without food. If it upsets your stomach, take it with food. Do not crush, chew, or cut these tablets. This medicine is taken once daily at the same time each day. Do not take your medicine more often than directed. Do not stop taking this medicine suddenly except upon the advice of your doctor. Stopping this medicine too quickly may cause serious side effects or your condition may worsen. A special MedGuide will be given to you by the pharmacist with each prescription and refill. Be sure to read this information carefully each time. Talk to your pediatrician regarding the use of this medicine in children. Special care may be needed. Overdosage: If you think you have taken too much of this medicine contact a poison control center or emergency room  at once. NOTE: This medicine is only for you. Do not share this medicine with others. What if I miss a dose? If you miss a dose, skip the missed dose and take your next tablet at the regular time. Do not take double or extra doses. What may interact with this medicine? Do not take this medicine with any of the following medications:  linezolid  MAOIs like Azilect, Carbex, Eldepryl, Marplan, Nardil, and Parnate  methylene blue (injected into a vein)  other medicines that contain bupropion like Zyban This medicine may also interact with the following medications:  alcohol  certain medicines for anxiety or sleep  certain medicines for blood pressure like metoprolol, propranolol  certain medicines for depression or psychotic disturbances  certain medicines for HIV or AIDS like efavirenz, lopinavir, nelfinavir, ritonavir  certain medicines for irregular heart beat like propafenone, flecainide  certain medicines for Parkinson's disease like amantadine, levodopa  certain medicines for seizures like carbamazepine, phenytoin, phenobarbital  cimetidine  clopidogrel  cyclophosphamide  digoxin  furazolidone  isoniazid  nicotine  orphenadrine  procarbazine  steroid medicines like prednisone or cortisone  stimulant medicines for attention disorders, weight loss, or to stay awake  tamoxifen  theophylline  thiotepa  ticlopidine  tramadol  warfarin This list may not describe all possible interactions. Give your health care provider a list of all the medicines, herbs, non-prescription drugs, or dietary supplements you use. Also tell them if you smoke, drink alcohol, or use illegal drugs. Some items may interact with your medicine. What should I watch for while using this medicine? Tell your doctor if your symptoms do  not get better or if they get worse. Visit your doctor or healthcare provider for regular checks on your progress. Because it may take several weeks to  see the full effects of this medicine, it is important to continue your treatment as prescribed by your doctor. This medicine may cause serious skin reactions. They can happen weeks to months after starting the medicine. Contact your healthcare provider right away if you notice fevers or flu-like symptoms with a rash. The rash may be red or purple and then turn into blisters or peeling of the skin. Or, you might notice a red rash with swelling of the face, lips or lymph nodes in your neck or under your arms. Patients and their families should watch out for new or worsening thoughts of suicide or depression. Also watch out for sudden changes in feelings such as feeling anxious, agitated, panicky, irritable, hostile, aggressive, impulsive, severely restless, overly excited and hyperactive, or not being able to sleep. If this happens, especially at the beginning of treatment or after a change in dose, call your healthcare provider. Avoid alcoholic drinks while taking this medicine. Drinking large amounts of alcoholic beverages, using sleeping or anxiety medicines, or quickly stopping the use of these agents while taking this medicine may increase your risk for a seizure. Do not drive or use heavy machinery until you know how this medicine affects you. This medicine can impair your ability to perform these tasks. Do not take this medicine close to bedtime. It may prevent you from sleeping. Your mouth may get dry. Chewing sugarless gum or sucking hard candy, and drinking plenty of water may help. Contact your doctor if the problem does not go away or is severe. The tablet shell for some brands of this medicine does not dissolve. This is normal. The tablet shell may appear whole in the stool. This is not a cause for concern. What side effects may I notice from receiving this medicine? Side effects that you should report to your doctor or health care professional as soon as possible:  allergic reactions like  skin rash, itching or hives, swelling of the face, lips, or tongue  breathing problems  changes in vision  confusion  elevated mood, decreased need for sleep, racing thoughts, impulsive behavior  fast or irregular heartbeat  hallucinations, loss of contact with reality  increased blood pressure  rash, fever, and swollen lymph nodes  redness, blistering, peeling or loosening of the skin, including inside the mouth  seizures  suicidal thoughts or other mood changes  unusually weak or tired  vomiting Side effects that usually do not require medical attention (report to your doctor or health care professional if they continue or are bothersome):  constipation  headache  loss of appetite  nausea  tremors  weight loss This list may not describe all possible side effects. Call your doctor for medical advice about side effects. You may report side effects to FDA at 1-800-FDA-1088. Where should I keep my medicine? Keep out of the reach of children. Store at room temperature between 15 and 30 degrees C (59 and 86 degrees F). Throw away any unused medicine after the expiration date. NOTE: This sheet is a summary. It may not cover all possible information. If you have questions about this medicine, talk to your doctor, pharmacist, or health care provider.  2020 Elsevier/Gold Standard (2018-08-03 13:45:31)  Ethinyl Estradiol; Etonogestrel vaginal ring What is this medicine? ETHINYL ESTRADIOL; ETONOGESTREL (ETH in il es tra DYE ole; et oh noe  JES trel) vaginal ring is a flexible, vaginal ring used as a contraceptive (birth control method). This medicine combines 2 types of female hormones, an estrogen and a progestin. This ring is used to prevent ovulation and pregnancy. Each ring is effective for 1 month. This medicine may be used for other purposes; ask your health care provider or pharmacist if you have questions. COMMON BRAND NAME(S): EluRyng, NuvaRing What should I tell my  health care provider before I take this medicine? They need to know if you have any of these conditions:  abnormal vaginal bleeding  blood vessel disease or blood clots  breast, cervical, endometrial, ovarian, liver, or uterine cancer  diabetes  gallbladder disease  having surgery  heart disease or recent heart attack  high blood pressure  high cholesterol or triglycerides  history of irregular heartbeat or heart valve problems  kidney disease  liver disease  migraine headaches  protein C deficiency  protein S deficiency  recently had a baby, miscarriage, or abortion  stroke  systemic lupus erythematosus (SLE)  tobacco smoker  your age is more than 36 years old  an unusual or allergic reaction to estrogens, progestins, other medicines, foods, dyes, or preservatives  pregnant or trying to get pregnant  breast-feeding How should I use this medicine? Insert the ring into your vagina as directed. Follow the directions on the prescription label. The ring will remain place for 3 weeks and is then removed for a 1-week break. A new ring is inserted 1 week after the last ring was removed, on the same day of the week. Check often to make sure the ring is still in place. If the ring was out of the vagina for an unknown amount of time, you may not be protected from pregnancy. Perform a pregnancy test and call your doctor. Do not use more often than directed. A patient package insert for the product will be given with each prescription and refill. Read this sheet carefully each time. The sheet may change frequently. Contact your pediatrician regarding the use of this medicine in children. Special care may be needed. Overdosage: If you think you have taken too much of this medicine contact a poison control center or emergency room at once. NOTE: This medicine is only for you. Do not share this medicine with others. What if I miss a dose? You will need to use the ring exactly  as directed. It is very important to follow the schedule every cycle. If you do not use the ring as directed, you may not be protected from pregnancy. If the ring should slip out, is lost, or if you leave it in longer or shorter than you should, contact your health care professional for advice. What may interact with this medicine? Do not take this medicine with the following medications:  dasabuvir; ombitasvir; paritaprevir; ritonavir  ombitasvir; paritaprevir; ritonavir  vaginal lubricants or other vaginal products that are oil-based or silicone-based This medicine may also interact with the following medications:  acetaminophen  antibiotics or medicines for infections, especially rifampin, rifabutin, rifapentine, and griseofulvin, and possibly penicillins or tetracyclines  aprepitant or fosaprepitant  armodafinil  ascorbic acid (vitamin C)  barbiturate medicines, such as phenobarbital or primidone  bosentan  certain antiviral medicines for hepatitis, HIV or AIDS  certain medicines for cancer treatment  certain medicines for seizures like carbamazepine, clobazam, felbamate, lamotrigine, oxcarbazepine, phenytoin, rufinamide, topiramate  certain medicines for treating high cholesterol  cyclosporine  dantrolene  elagolix  flibanserin  grapefruit juice  lesinurad  medicines for diabetes  medicines to treat fungal infections, such as griseofulvin, miconazole, fluconazole, ketoconazole, itraconazole, posaconazole or voriconazole  mifepristone  mitotane  modafinil  morphine  mycophenolate  St. John's wort  tamoxifen  temazepam  theophylline or aminophylline  thyroid hormones  tizanidine  tranexamic acid  ulipristal  warfarin This list may not describe all possible interactions. Give your health care provider a list of all the medicines, herbs, non-prescription drugs, or dietary supplements you use. Also tell them if you smoke, drink alcohol, or  use illegal drugs. Some items may interact with your medicine. What should I watch for while using this medicine? Visit your doctor or health care professional for regular checks on your progress. You will need a regular breast and pelvic exam and Pap smear while on this medicine. Check with your doctor or health care professional to see if you need an additional method of contraception during the first cycle that you use this ring. Female condoms (made with natural rubber latex, polyisoprene, and polyurethane) and spermicides may be used. Do not use a diaphragm, cervical cap, or a female condom, as the ring can interfere with these birth control methods and their proper placement. If you have any reason to think you are pregnant, stop using this medicine right away and contact your doctor or health care professional. If you are using this medicine for hormone related problems, it may take several cycles of use to see improvement in your condition. Smoking increases the risk of getting a blood clot or having a stroke while you are using hormonal birth control, especially if you are more than 36 years old. You are strongly advised not to smoke. Some women are prone to getting dark patches on the skin of the face (cholasma). Your risk of getting chloasma with this medicine is higher if you had chloasma during a pregnancy. Keep out of the sun. If you cannot avoid being in the sun, wear protective clothing and use sunscreen. Do not use sun lamps or tanning beds/booths. This medicine can make your body retain fluid, making your fingers, hands, or ankles swell. Your blood pressure can go up. Contact your doctor or health care professional if you feel you are retaining fluid. If you are going to have elective surgery, you may need to stop using this medicine before the surgery. Consult your health care professional for advice. This medicine does not protect you against HIV infection (AIDS) or any other sexually  transmitted diseases. What side effects may I notice from receiving this medicine? Side effects that you should report to your doctor or health care professional as soon as possible:  allergic reactions such as skin rash or itching, hives, swelling of the lips, mouth, tongue, or throat  depression  high blood pressure  migraines or severe, sudden headaches  signs and symptoms of a blood clot such as breathing problems; changes in vision; chest pain; severe, sudden headache; pain, swelling, warmth in the leg; trouble speaking; sudden numbness or weakness of the face, arm or leg  signs and symptoms of infection like fever or chills with dizziness and a sunburn-like rash, or pain or trouble passing urine  stomach pain  symptoms of vaginal infection like itching, irritation or unusual discharge  yellowing of the eyes or skin Side effects that usually do not require medical attention (report these to your doctor or health care professional if they continue or are bothersome):  acne  breast pain, tenderness  irregular vaginal bleeding or  spotting, particularly during the first month of use  mild headache  nausea  painful periods  vomiting This list may not describe all possible side effects. Call your doctor for medical advice about side effects. You may report side effects to FDA at 1-800-FDA-1088. Where should I keep my medicine? Keep out of the reach of children. Store unopened rings in the original foil pouch at room temperature between 20 and 25 degrees C (68 and 77 degrees F) for up to 4 months. Protect from light. Do not store above 30 degrees C (86 degrees F). Throw away any unused medicine after the expiration date. A ring may only be used for 1 cycle (1 month). After the 3-week cycle, a used ring is removed and should be placed in the re-closable foil pouch and discarded in the trash out of reach of children and pets. Do NOT flush down the toilet. NOTE: This sheet is a  summary. It may not cover all possible information. If you have questions about this medicine, talk to your doctor, pharmacist, or health care provider.  2020 Elsevier/Gold Standard (2017-01-07 14:41:10)

## 2019-03-20 NOTE — Progress Notes (Signed)
Chief Complaint  Patient presents with  . Contraception   F/u  1. Depression and anxiety uncontrolled ? Trigger due to current events on effexor 75 mg qd xr and klonopin 0.5 mg qd prn having to take more and not helping with mood and anxiety tried effexor 150 mg xr, celexa 20 mg qd  She reports husband is trying all he can do to make her happy she is seeking happiness in other things though does not specify  2. Wants to be on OCP reviewed options declines IUD and depo and has trouble taking pills but will try again  3. Psoriasis doing better on Skyrizi and seeing [facility]    Review of Systems  Constitutional: Negative for weight loss.  HENT: Negative for hearing loss.   Eyes: Negative for blurred vision.  Respiratory: Negative for shortness of breath.   Cardiovascular: Negative for chest pain.  Gastrointestinal: Negative for abdominal pain.  Skin: Negative for rash.  Neurological: Negative for headaches.  Psychiatric/Behavioral: Negative for depression.   Past Medical History:  Diagnosis Date  . Anxiety   . Complete miscarriage   . Depression    postpartum   . IBS (irritable bowel syndrome)    constipation  . Kidney stones   . Melasma    lip  . Psoriasis    on Humira Dr. Phillip Heal   . UTI (urinary tract infection)    Past Surgical History:  Procedure Laterality Date  . CERVICAL BIOPSY    . KNEE SURGERY     cyst removal  . UTERINE SEPTUM RESECTION  04/2012   Dr. March Rumm[facility] at [facility] History  Problem Relation Age of Onset  . Rashes / Skin problems Mother   . Other Mother        abnormal mammogram but not cancer  . Hyperlipidemia Father   . Rashes / Skin problems Father   . Rashes / Skin problems Sister    Social History   Socioeconomic History  . Marital status: Married    Spouse name: Not on file  . Number of children: Not on file  . Years of education: Not on file  . Highest education level: Not on file  Occupational History  . Not on file   Social Needs  . Financial resource strain: Not on file  . Food insecurity    Worry: Not on file    Inability: Not on file  . Transportation needs    Medical: Not on file    Non-medical: Not on file  Tobacco Use  . Smoking status: Never Smoker  . Smokeless tobacco: Never Used  Substance and Sexual Activity  . Alcohol use: No    Alcohol/week: 0.0 standard drinks  . Drug use: No  . Sexual activity: Yes    Partners: Male  Lifestyle  . Physical activity    Days per week: Not on file    Minutes per session: Not on file  . Stress: Not on file  Relationships  . Social Herbalist on phone: Not on file    Gets together: Not on file    Attends religious service: Not on file    Active member of club or organization: Not on file    Attends meetings of clubs or organizations: Not on file    Relationship status: Not on file  . Intimate partner violence    Fear of current or ex partner: Not on file    Emotionally abused: Not on file  Physically abused: Not on file    Forced sexual activity: Not on file  Other Topics Concern  . 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 Cholecalciferol (VITAMIN D3) 5000 units CAPS Take by mouth.  . clonazePAM (KLONOPIN) 0.5 MG tablet Take 1 tablet (0.5 mg total) by mouth daily as needed for anxiety.  . ondansetron (ZOFRAN) 4 MG tablet Take 1 tablet (4 mg total) by mouth every 8 (eight) hours as needed for nausea or vomiting.  . phentermine (ADIPEX-P) 37.5 MG tablet Take 1 tablet (37.5 mg total) by mouth daily before breakfast.  . promethazine (PHENERGAN) 12.5 MG tablet Take 1 tablet (12.5 mg total) by mouth 2 (two) times daily as needed for nausea or vomiting.  . SKYRIZI, 150 MG DOSE, 75 MG/0.83ML PSKT 2 doses 0 month, 1 month then every 3 months  Dr. Kellie Moor  . venlafaxine XR (EFFEXOR-XR) 37.5 MG 24 hr capsule TAKE ONE CAPSULE BY MOUTH DAILY WITH BREAKFAST  .  [DISCONTINUED] venlafaxine XR (EFFEXOR-XR) 75 MG 24 hr capsule TAKE ONE CAPSULE BY MOUTH DAILY WITH BREAKFAST   No Known Allergies No results found for this or any previous visit (from the past 2160 hour(s)). Objective  Body mass index is 29.5 kg/m. Wt Readings from Last 3 Encounters:  03/20/19 160 lb (72.6 kg)  07/14/18 172 lb (78 kg)  01/16/18 164 lb (74.4 kg)   Temp Readings from Last 3 Encounters:  03/20/19 97.8 F (36.6 C) (Oral)  07/14/18 98.6 F (37 C) (Oral)  01/16/18 99.3 F (37.4 C) (Oral)   BP Readings from Last 3 Encounters:  03/20/19 110/82  07/14/18 122/78  01/16/18 116/72   Pulse Readings from Last 3 Encounters:  03/20/19 94  07/14/18 93  01/16/18 (!) 104    Physical Exam Vitals signs and nursing note reviewed.  Constitutional:      Appearance: Normal appearance. She is well-developed and well-groomed. She is obese.  HENT:     Head: Normocephalic and atraumatic.  Eyes:     Conjunctiva/sclera: Conjunctivae normal.     Pupils: Pupils are equal, round, and reactive to light.  Cardiovascular:     Rate and Rhythm: Normal rate and regular rhythm.     Heart sounds: Normal heart sounds.  Pulmonary:     Effort: Pulmonary effort is normal.     Breath sounds: Normal breath sounds.  Abdominal:     General: Abdomen is flat. Bowel sounds are normal.  Skin:    General: Skin is warm and dry.  Neurological:     General: No focal deficit present.     Mental Status: She is alert and oriented to person, place, and time. Mental status is at baseline.     Gait: Gait normal.  Psychiatric:        Attention and Perception: Attention and perception normal.        Mood and Affect: Mood and affect normal.        Speech: Speech normal.        Behavior: Behavior normal. Behavior is cooperative.        Thought Content: Thought content normal.        Cognition and Memory: Cognition and memory normal.        Judgment: Judgment normal.     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 Phillip Heal Changed to [facility] dermat[facility]   Pap neg 09/22/15 neg HPVoverdue will do a f/u  -will do at f/u  Provider: Dr. Olivia Mackie McLean-Scocuzza-Internal Medicine

## 2019-03-26 ENCOUNTER — Telehealth: Payer: Self-pay | Admitting: *Deleted

## 2019-03-26 ENCOUNTER — Other Ambulatory Visit: Payer: Self-pay | Admitting: Internal Medicine

## 2019-03-26 DIAGNOSIS — E559 Vitamin D deficiency, unspecified: Secondary | ICD-10-CM

## 2019-03-26 DIAGNOSIS — Z1389 Encounter for screening for other disorder: Secondary | ICD-10-CM

## 2019-03-26 DIAGNOSIS — Z0000 Encounter for general adult medical examination without abnormal findings: Secondary | ICD-10-CM

## 2019-03-26 DIAGNOSIS — L409 Psoriasis, unspecified: Secondary | ICD-10-CM

## 2019-03-26 DIAGNOSIS — Z13220 Encounter for screening for lipoid disorders: Secondary | ICD-10-CM

## 2019-03-26 DIAGNOSIS — Z1329 Encounter for screening for other suspected endocrine disorder: Secondary | ICD-10-CM

## 2019-03-26 NOTE — Telephone Encounter (Signed)
Done   TMS 

## 2019-03-26 NOTE — Telephone Encounter (Signed)
Please place future orders for lab appt.  

## 2019-03-27 ENCOUNTER — Other Ambulatory Visit: Payer: BC Managed Care – PPO

## 2019-04-12 ENCOUNTER — Ambulatory Visit: Payer: BC Managed Care – PPO | Admitting: Psychology

## 2019-05-22 ENCOUNTER — Ambulatory Visit (INDEPENDENT_AMBULATORY_CARE_PROVIDER_SITE_OTHER): Payer: BC Managed Care – PPO | Admitting: Psychology

## 2019-05-22 DIAGNOSIS — F3289 Other specified depressive episodes: Secondary | ICD-10-CM | POA: Diagnosis not present

## 2019-06-09 ENCOUNTER — Ambulatory Visit: Payer: Self-pay | Admitting: Psychology

## 2019-06-19 ENCOUNTER — Telehealth: Payer: Self-pay | Admitting: Internal Medicine

## 2019-06-19 NOTE — Telephone Encounter (Signed)
I called pt twice and left a vm to call ofc. °

## 2019-06-21 ENCOUNTER — Ambulatory Visit: Payer: BC Managed Care – PPO | Admitting: Psychology

## 2019-06-21 ENCOUNTER — Ambulatory Visit: Payer: BC Managed Care – PPO | Admitting: Internal Medicine

## 2019-06-21 ENCOUNTER — Telehealth: Payer: BC Managed Care – PPO | Admitting: Physician Assistant

## 2019-06-21 DIAGNOSIS — R399 Unspecified symptoms and signs involving the genitourinary system: Secondary | ICD-10-CM

## 2019-06-21 MED ORDER — CEPHALEXIN 500 MG PO CAPS: 500.0000 mg | ORAL_CAPSULE | Freq: Two times a day (BID) | ORAL | 0 refills | Status: AC

## 2019-06-21 NOTE — Progress Notes (Signed)

## 2019-07-15 ENCOUNTER — Other Ambulatory Visit: Payer: Self-pay | Admitting: Internal Medicine

## 2019-07-15 DIAGNOSIS — F419 Anxiety disorder, unspecified: Secondary | ICD-10-CM

## 2019-07-15 DIAGNOSIS — F329 Major depressive disorder, single episode, unspecified: Secondary | ICD-10-CM

## 2019-07-15 MED ORDER — BUPROPION HCL ER (XL) 150 MG PO TB24: 150.0000 mg | ORAL_TABLET | Freq: Every day | ORAL | 3 refills | Status: DC

## 2019-07-18 ENCOUNTER — Encounter: Payer: Self-pay | Admitting: Internal Medicine

## 2019-07-18 ENCOUNTER — Other Ambulatory Visit: Payer: Self-pay | Admitting: Internal Medicine

## 2019-07-18 DIAGNOSIS — F419 Anxiety disorder, unspecified: Secondary | ICD-10-CM

## 2019-07-18 DIAGNOSIS — F329 Major depressive disorder, single episode, unspecified: Secondary | ICD-10-CM

## 2019-07-18 MED ORDER — BUPROPION HCL ER (XL) 150 MG PO TB24: 150.0000 mg | ORAL_TABLET | Freq: Every day | ORAL | 11 refills | Status: DC

## 2019-08-01 ENCOUNTER — Other Ambulatory Visit (HOSPITAL_COMMUNITY)
Admission: RE | Admit: 2019-08-01 | Discharge: 2019-08-01 | Disposition: A | Discharge status: Home / Self Care | Payer: BC Managed Care – PPO | Source: Ambulatory Visit | Attending: Internal Medicine | Admitting: Internal Medicine

## 2019-08-01 ENCOUNTER — Other Ambulatory Visit: Payer: Self-pay

## 2019-08-01 ENCOUNTER — Encounter: Payer: Self-pay | Admitting: Internal Medicine

## 2019-08-01 ENCOUNTER — Ambulatory Visit: Payer: BC Managed Care – PPO | Admitting: Internal Medicine

## 2019-08-01 VITALS — BP 118/80 | HR 113 | Temp 98.0°F | Ht 61.75 in | Wt 166.4 lb

## 2019-08-01 DIAGNOSIS — E559 Vitamin D deficiency, unspecified: Secondary | ICD-10-CM

## 2019-08-01 DIAGNOSIS — B977 Papillomavirus as the cause of diseases classified elsewhere: Secondary | ICD-10-CM

## 2019-08-01 DIAGNOSIS — N760 Acute vaginitis: Secondary | ICD-10-CM | POA: Diagnosis present

## 2019-08-01 DIAGNOSIS — Z1329 Encounter for screening for other suspected endocrine disorder: Secondary | ICD-10-CM

## 2019-08-01 DIAGNOSIS — R87612 Low grade squamous intraepithelial lesion on cytologic smear of cervix (LGSIL): Secondary | ICD-10-CM

## 2019-08-01 DIAGNOSIS — F419 Anxiety disorder, unspecified: Secondary | ICD-10-CM

## 2019-08-01 DIAGNOSIS — Z124 Encounter for screening for malignant neoplasm of cervix: Secondary | ICD-10-CM | POA: Insufficient documentation

## 2019-08-01 DIAGNOSIS — R000 Tachycardia, unspecified: Secondary | ICD-10-CM

## 2019-08-01 DIAGNOSIS — E785 Hyperlipidemia, unspecified: Secondary | ICD-10-CM

## 2019-08-01 DIAGNOSIS — Z111 Encounter for screening for respiratory tuberculosis: Secondary | ICD-10-CM

## 2019-08-01 DIAGNOSIS — Z1389 Encounter for screening for other disorder: Secondary | ICD-10-CM

## 2019-08-01 DIAGNOSIS — Z0000 Encounter for general adult medical examination without abnormal findings: Secondary | ICD-10-CM

## 2019-08-01 MED ORDER — VENLAFAXINE HCL ER 75 MG PO CP24: 75.0000 mg | ORAL_CAPSULE | Freq: Every day | ORAL | 3 refills | Status: DC

## 2019-08-01 MED ORDER — CLONAZEPAM 0.5 MG PO TABS: 0.5000 mg | ORAL_TABLET | Freq: Every day | ORAL | 2 refills | Status: DC | PRN

## 2019-08-01 NOTE — Progress Notes (Signed)
Chief Complaint  Patient presents with  . Follow-up  . Gynecologic Exam   Annual doing well  1. Anxiety increased due to stressors at home work wants to increase effexor back to 75 from 37.5, refill klonopin. 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 Immediate family recently with covid 42 but she did not get it   2. Pap today  3. Psoriasis doing well needs Quantiferon gold checked per dermatology will go to [facility] with comp labs     Review of Systems  Constitutional: Negative for weight loss.  HENT: Negative for hearing loss.   Eyes: Negative for blurred vision.  Respiratory: Negative for shortness of breath.   Cardiovascular: Negative for chest pain.  Gastrointestinal: Negative for abdominal pain.  Musculoskeletal: Negative for falls.  Skin: Negative for rash.  Neurological: Negative for headaches.  Psychiatric/Behavioral: The patient is nervous/anxious.    Past Medical History:  Diagnosis Date  . Anxiety   . Complete miscarriage   . Depression    postpartum   . IBS (irritable bowel syndrome)    constipation  . Kidney stones   . Melasma    lip  . Psoriasis    on Humira Dr. Phillip Heal   . UTI (urinary tract infection)    Past Surgical History:  Procedure Laterality Date  . CERVICAL BIOPSY    . KNEE SURGERY     cyst removal  . UTERINE SEPTUM RESECTION  04/2012   Dr. March Rummage at [facility] History  Problem Relation Age of Onset  . Rashes / Skin problems Mother   . Other Mother        abnormal mammogram but not cancer  . Hyperlipidemia Father   . Rashes / Skin problems Father   . Rashes / Skin problems Sister    Social History   Socioeconomic History  . Marital status: Married    Spouse name: Not on file  . Number of children: Not on file  . Years of education: Not on file  . Highest education level: Not on file  Occupational History  . Not on file  Tobacco Use  . Smoking status: Never Smoker  . Smokeless tobacco: Never Used   Substance and Sexual Activity  . Alcohol use: No    Alcohol/week: 0.0 standard drinks  . Drug use: No  . Sexual activity: Yes    Partners: Male  Other Topics Concern  . Not on file  Social History Narrative   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    Social Determinants of Health   Financial Resource Strain:   . Difficulty of Paying Living Expenses:   Food Insecurity:   . Worried About Charity fundraiser in the Last Year:   . Arboriculturist in the Last Year:   Transportation Needs:   . Film/video editor (Medical):   Marland Kitchen Lack of Transportation (Non-Medical):   Physical Activity:   . Days of Exercise per Week:   . Minutes of Exercise per Session:   Stress:   . Feeling of Stress :   Social Connections:   . Frequency of Communication with Friends and Family:   . Frequency of Social Gatherings with Friends and Family:   . Attends Religious Services:   . Active Member of Clubs or Organizations:   . Attends Archivist Meetings:   Marland Kitchen Marital Status:   Intimate Partner Violence:   . Fear of Current  or Ex-Partner:   . Emotionally Abused:   Marland Kitchen Physically Abused:   . Sexually Abused:    Current Meds  Medication Sig  . buPROPion (WELLBUTRIN XL) 150 MG 24 hr tablet Take 1 tablet (150 mg total) by mouth daily.  . Cholecalciferol (VITAMIN D3) 5000 units CAPS Take by mouth.  . clonazePAM (KLONOPIN) 0.5 MG tablet Take 1 tablet (0.5 mg total) by mouth daily as needed for anxiety.  . ondansetron (ZOFRAN) 4 MG tablet Take 1 tablet (4 mg total) by mouth every 8 (eight) hours as needed for nausea or vomiting.  . phentermine (ADIPEX-P) 37.5 MG tablet Take 1 tablet (37.5 mg total) by mouth daily before breakfast.  . promethazine (PHENERGAN) 12.5 MG tablet Take 1 tablet (12.5 mg total) by mouth 2 (two) times daily as needed for nausea or vomiting.  . SKYRIZI, 150 MG DOSE, 75 MG/0.83ML PSKT 2 doses 0 month, 1 month then every 3 months  Dr. Kellie Moor  . venlafaxine XR  (EFFEXOR-XR) 75 MG 24 hr capsule Take 1 capsule (75 mg total) by mouth daily with breakfast.  . [DISCONTINUED] clonazePAM (KLONOPIN) 0.5 MG tablet Take 1 tablet (0.5 mg total) by mouth daily as needed for anxiety.  . [DISCONTINUED] venlafaxine XR (EFFEXOR-XR) 75 MG 24 hr capsule Take 75 mg by mouth daily.   No Known Allergies No results found for this or any previous visit (from the past 2160 hour(s)). Objective  Body mass index is 30.68 kg/m. Wt Readings from Last 3 Encounters:  08/01/19 166 lb 6.4 oz (75.5 kg)  03/20/19 160 lb (72.6 kg)  07/14/18 172 lb (78 kg)   Temp Readings from Last 3 Encounters:  08/01/19 98 F (36.7 C) (Temporal)  03/20/19 97.8 F (36.6 C) (Oral)  07/14/18 98.6 F (37 C) (Oral)   BP Readings from Last 3 Encounters:  08/01/19 118/80  03/20/19 110/82  07/14/18 122/78   Pulse Readings from Last 3 Encounters:  08/01/19 (!) 113  03/20/19 94  07/14/18 93    Physical Exam Vitals and nursing note reviewed.  Constitutional:      Appearance: Normal appearance. She is well-developed and well-groomed. She is obese.  HENT:     Head: Normocephalic and atraumatic.     Ears:     Comments: Right ear cerumen   Eyes:     Conjunctiva/sclera: Conjunctivae normal.     Pupils: Pupils are equal, round, and reactive to light.  Cardiovascular:     Rate and Rhythm: Normal rate and regular rhythm.     Heart sounds: Normal heart sounds. No murmur.  Pulmonary:     Effort: Pulmonary effort is normal.     Breath sounds: Normal breath sounds.  Chest:     Chest wall: No mass.     Breasts: Breasts are symmetrical.        Right: Normal. No swelling, bleeding, inverted nipple, mass, nipple discharge, skin change or tenderness.        Left: Normal. No swelling, bleeding, inverted nipple, mass, nipple discharge, skin change or tenderness.  Abdominal:     General: Abdomen is flat. Bowel sounds are normal.  Lymphadenopathy:     Upper Body:     Right upper body: No axillary  adenopathy.     Left upper body: No axillary adenopathy.  Skin:    General: Skin is warm and dry.     Comments: PSA skin improved   Neurological:     General: No focal deficit present.     Mental  Status: She is alert and oriented to person, place, and time. Mental status is at baseline.     Gait: Gait normal.  Psychiatric:        Attention and Perception: Attention and perception normal.        Mood and Affect: Mood and affect normal.        Speech: Speech normal.        Behavior: Behavior normal. Behavior is cooperative.        Thought Content: Thought content normal.        Cognition and Memory: Cognition and memory normal.        Judgment: Judgment normal.     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 Phillip Heal Changed to [facility] dermatology on [address] for PSA rec healthy diet and exercise   Anxiety - Plan: venlafaxine XR (EFFEXOR-XR) 75 MG 24 hr capsule, clonazePAM (KLONOPIN) 0.5 MG tablet Cont wellbutrin  Vitamin D deficiency - Plan: Vitamin D (25 hydroxy)  Screening-pulmonary TB - Plan: QuantiFERON-TB Gold Plus, CANCELED: QuantiFERON-TB Gold Plus  Acute vaginitis - Plan: Cervicovaginal ancillary only( [place])  Sinus tachycardia  -could be related anxiety per pt is transient with MD appts monitor      Provider: Dr. Olivia Mackie McLean-Scocuzza-Internal Medicine

## 2019-08-02 ENCOUNTER — Encounter: Payer: Self-pay | Admitting: Internal Medicine

## 2019-08-02 NOTE — Patient Instructions (Addendum)
COVID-19 Vaccine Information can be found at: [email or web address] For questions related to vaccine distribution or appointments, please email [email or web address] or call [phone].  COVID 19  [phone] in [place]   [phone] in [place]  [phone] in [place]   Debrox ear wax drops over the counter to right ear 4-7 days

## 2019-08-03 LAB — CERVICOVAGINAL ANCILLARY ONLY
Bacterial Vaginitis (gardnerella): NEGATIVE
Candida Glabrata: NEGATIVE
Candida Vaginitis: NEGATIVE
Chlamydia: NEGATIVE
Comment: NEGATIVE
Comment: NEGATIVE
Comment: NEGATIVE
Comment: NEGATIVE
Comment: NEGATIVE
Comment: NORMAL
Neisseria Gonorrhea: NEGATIVE
Trichomonas: NEGATIVE

## 2019-08-06 LAB — CYTOLOGY - PAP
Comment: NEGATIVE
High risk HPV: POSITIVE — AB

## 2019-08-15 ENCOUNTER — Telehealth: Payer: Self-pay | Admitting: Internal Medicine

## 2019-08-15 NOTE — Telephone Encounter (Signed)
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  Does she prefer female or female?   Can we fu on this?   TMS

## 2019-08-16 NOTE — Telephone Encounter (Signed)
Called to inform the patient of her results. Patient wanted to inquire about next steps. She states she prefers female providers. Informed the patient that we will place this referral.  This information did not satisfy the patient. She states she would like to speak with the nurse as she states it seems I am not familiar with next steps. I informed pt this was true as OBGYN typically handles further evaluation. She states she would like to speak with the nurse who knows what she is talking about.   Please call pt back to discuss.

## 2019-08-16 NOTE — Telephone Encounter (Signed)
Patient just had some questions because  she has been through this before she said about 20 years ago and she also does not want to go to [facility] ob/gyn. Patient prefers a female OB/GYN.

## 2019-08-17 LAB — CBC WITH DIFFERENTIAL/PLATELET
Basophils Absolute: 0 10*3/uL (ref 0.0–0.2)
Basos: 0 %
EOS (ABSOLUTE): 0.5 10*3/uL — ABNORMAL HIGH (ref 0.0–0.4)
Eos: 7 %
Hematocrit: 42.5 % (ref 34.0–46.6)
Hemoglobin: 14.1 g/dL (ref 11.1–15.9)
Immature Grans (Abs): 0 10*3/uL (ref 0.0–0.1)
Immature Granulocytes: 0 %
Lymphocytes Absolute: 1.6 10*3/uL (ref 0.7–3.1)
Lymphs: 23 %
MCH: 27.9 pg (ref 26.6–33.0)
MCHC: 33.2 g/dL (ref 31.5–35.7)
MCV: 84 fL (ref 79–97)
Monocytes Absolute: 0.5 10*3/uL (ref 0.1–0.9)
Monocytes: 7 %
Neutrophils Absolute: 4.4 10*3/uL (ref 1.4–7.0)
Neutrophils: 63 %
Platelets: 363 10*3/uL (ref 150–450)
RBC: 5.05 x10E6/uL (ref 3.77–5.28)
RDW: 12.6 % (ref 11.7–15.4)
WBC: 7.1 10*3/uL (ref 3.4–10.8)

## 2019-08-17 LAB — COMPREHENSIVE METABOLIC PANEL
ALT: 12 IU/L (ref 0–32)
AST: 15 IU/L (ref 0–40)
Albumin/Globulin Ratio: 1.5 (ref 1.2–2.2)
Albumin: 4.1 g/dL (ref 3.8–4.8)
Alkaline Phosphatase: 64 IU/L (ref 39–117)
BUN/Creatinine Ratio: 13 (ref 9–23)
BUN: 9 mg/dL (ref 6–20)
Bilirubin Total: 0.3 mg/dL (ref 0.0–1.2)
CO2: 23 mmol/L (ref 20–29)
Calcium: 9.1 mg/dL (ref 8.7–10.2)
Chloride: 105 mmol/L (ref 96–106)
Creatinine, Ser: 0.67 mg/dL (ref 0.57–1.00)
GFR calc Af Amer: 131 mL/min/{1.73_m2} (ref >59)
GFR calc non Af Amer: 113 mL/min/{1.73_m2} (ref >59)
Globulin, Total: 2.7 g/dL (ref 1.5–4.5)
Glucose: 90 mg/dL (ref 65–99)
Potassium: 4.3 mmol/L (ref 3.5–5.2)
Sodium: 141 mmol/L (ref 134–144)
Total Protein: 6.8 g/dL (ref 6.0–8.5)

## 2019-08-17 LAB — MICROSCOPIC EXAMINATION: Casts: NONE SEEN /lpf

## 2019-08-17 LAB — QUANTIFERON-TB GOLD PLUS
QuantiFERON Mitogen Value: >10 IU/mL
QuantiFERON Nil Value: 0.01 IU/mL
QuantiFERON TB1 Ag Value: 0.03 IU/mL
QuantiFERON TB2 Ag Value: 0.02 IU/mL
QuantiFERON-TB Gold Plus: NEGATIVE

## 2019-08-17 LAB — LIPID PANEL
Chol/HDL Ratio: 3 ratio (ref 0.0–4.4)
Cholesterol, Total: 180 mg/dL (ref 100–199)
HDL: 60 mg/dL (ref >39)
LDL Chol Calc (NIH): 108 mg/dL — ABNORMAL HIGH (ref 0–99)
Triglycerides: 64 mg/dL (ref 0–149)
VLDL Cholesterol Cal: 12 mg/dL (ref 5–40)

## 2019-08-17 LAB — URINALYSIS, ROUTINE W REFLEX MICROSCOPIC
Bilirubin, UA: NEGATIVE
Glucose, UA: NEGATIVE
Ketones, UA: NEGATIVE
Nitrite, UA: NEGATIVE
Protein,UA: NEGATIVE
RBC, UA: NEGATIVE
Specific Gravity, UA: 1.024 (ref 1.005–1.030)
Urobilinogen, Ur: 1 mg/dL (ref 0.2–1.0)
pH, UA: 7.5 (ref 5.0–7.5)

## 2019-08-17 LAB — VITAMIN D 25 HYDROXY (VIT D DEFICIENCY, FRACTURES): Vit D, 25-Hydroxy: 30.6 ng/mL (ref 30.0–100.0)

## 2019-08-17 LAB — TSH: TSH: 2.2 u[IU]/mL (ref 0.450–4.500)

## 2019-08-20 DIAGNOSIS — R87612 Low grade squamous intraepithelial lesion on cytologic smear of cervix (LGSIL): Secondary | ICD-10-CM | POA: Insufficient documentation

## 2019-08-20 DIAGNOSIS — B977 Papillomavirus as the cause of diseases classified elsewhere: Secondary | ICD-10-CM | POA: Insufficient documentation

## 2019-08-20 NOTE — Telephone Encounter (Signed)
Noted  

## 2019-08-20 NOTE — Addendum Note (Signed)
Addended by: Quentin Ore on: 08/20/2019 01:05 PM   Modules accepted: Orders

## 2019-08-20 NOTE — Telephone Encounter (Signed)
Referred to Dr. Conni Elliot ob/gyn likely will do colposcopy of cervix which is a biopsy and will need to f/u with ob/gyn next steps after this   TMS

## 2019-12-06 ENCOUNTER — Telehealth: Payer: BC Managed Care – PPO | Admitting: Physician Assistant

## 2019-12-06 DIAGNOSIS — H9210 Otorrhea, unspecified ear: Secondary | ICD-10-CM

## 2019-12-06 MED ORDER — NEOMYCIN-POLYMYXIN-HC 3.5-10000-1 OT SOLN: 3.0000 [drp] | Freq: Four times a day (QID) | OTIC | 0 refills | Status: AC

## 2019-12-06 NOTE — Progress Notes (Signed)
E Visit for Swimmer's Ear  We are sorry that you are not feeling well. Here is how we plan to help!  Based on what you have shared with me it looks like you have swimmers ear. Swimmer's ear is a redness or swelling, irritation, or infection of your outer ear canal.  These symptoms usually occur within a few days of swimming.  Your ear canal is a tube that goes from the opening of the ear to the eardrum.  When water stays in your ear canal, germs can grow.  This is a painful condition that often happens to children and swimmers of all ages.  It is not contagious and oral antibiotics are not required to treat uncomplicated swimmer's ear.  The usual symptoms include: Itching inside the ear, Redness or a sense of swelling in the ear, Pain when the ear is tugged on when pressure is placed on the ear, Pus draining from the infected ear.   I have prescribed Cortisporin drops. Use 3 drops in the affected ear 4 times daily for 10 days.   In certain cases swimmer's ear may progress to a more serious bacterial infection of the middle or inner ear.  If you have a fever 102 and up and significantly worsening symptoms, this could indicate a more serious infection moving to the middle/inner and needs face to face evaluation in an office by a provider.  Your symptoms should improve over the next 3 days and should resolve in about 7 days.  HOME CARE:   Wash your hands frequently.  Do not place the tip of the bottle on your ear or touch it with your fingers.  You can take Acetominophen 650 mg every 4-6 hours as needed for pain.  If pain is severe or moderate, you can apply a heating pad (set on low) or hot water bottle (wrapped in a towel) to outer ear for 20 minutes.  This will also increase drainage.  Avoid ear plugs  Do not use Q-tips  After showers, help the water run out by tilting your head to one side.  GET HELP RIGHT AWAY IF:   Fever is over 102.2 degrees.  You develop progressive ear pain or  hearing loss.  Ear symptoms persist longer than 3 days after treatment.  MAKE SURE YOU:   Understand these instructions.  Will watch your condition.  Will get help right away if you are not doing well or get worse.  TO PREVENT SWIMMER'S EAR:  Use a bathing cap or custom fitted swim molds to keep your ears dry.  Towel off after swimming to dry your ears.  Tilt your head or pull your earlobes to allow the water to escape your ear canal.  If there is still water in your ears, consider using a hairdryer on the lowest setting.  Thank you for choosing an e-visit. Your e-visit answers were reviewed by a board certified advanced clinical practitioner to complete your personal care plan. Depending upon the condition, your plan could have included both over the counter or prescription medications. Please review your pharmacy choice. Be sure that the pharmacy you have chosen is open so that you can pick up your prescription now.  If there is a problem you may message your provider in MyChart to have the prescription routed to another pharmacy. Your safety is important to [place]. If you have drug allergies check your prescription carefully.  For the next 24 hours, you can use MyChart to ask questions about todays visit,  request a non-urgent call back, or ask for a work or school excuse from your e-visit provider. You will get an email in the next two days asking about your experience. I hope that your e-visit has been valuable and will speed your recovery.   Approximately 5 minutes was spent documenting and reviewing patient's chart.

## 2019-12-17 ENCOUNTER — Telehealth: Payer: BC Managed Care – PPO | Admitting: Family

## 2019-12-17 DIAGNOSIS — R399 Unspecified symptoms and signs involving the genitourinary system: Secondary | ICD-10-CM

## 2019-12-17 MED ORDER — CEPHALEXIN 500 MG PO CAPS: 500.0000 mg | ORAL_CAPSULE | Freq: Two times a day (BID) | ORAL | 0 refills | Status: DC

## 2019-12-17 NOTE — Progress Notes (Signed)

## 2020-03-07 ENCOUNTER — Telehealth: Payer: BC Managed Care – PPO | Admitting: Emergency Medicine

## 2020-03-07 DIAGNOSIS — R300 Dysuria: Secondary | ICD-10-CM | POA: Diagnosis not present

## 2020-03-07 MED ORDER — NITROFURANTOIN MONOHYD MACRO 100 MG PO CAPS: 100.0000 mg | ORAL_CAPSULE | Freq: Two times a day (BID) | ORAL | 0 refills | Status: DC

## 2020-03-07 NOTE — Progress Notes (Signed)
We are sorry that you are not feeling well.  Here is how we plan to help!  Based on what you shared with me it looks like you most likely have a simple urinary tract infection.  A UTI (Urinary Tract Infection) is a bacterial infection of the bladder.  Most cases of urinary tract infections are simple to treat but a key part of your care is to encourage you to drink plenty of fluids and watch your symptoms carefully.  I have prescribed MacroBid 100 mg twice a day for 5 days.  Your symptoms should gradually improve. Call us if the burning in your urine worsens, you develop worsening fever, back pain or pelvic pain or if your symptoms do not resolve after completing the antibiotic.  Urinary tract infections can be prevented by drinking plenty of water to keep your body hydrated.  Also be sure when you wipe, wipe from front to back and don't hold it in!  If possible, empty your bladder every 4 hours.  Your e-visit answers were reviewed by a board certified advanced clinical practitioner to complete your personal care plan.  Depending on the condition, your plan could have included both over the counter or prescription medications.  If there is a problem please reply  once you have received a response from your provider.  Your safety is important to us.  If you have drug allergies check your prescription carefully.    You can use MyChart to ask questions about today's visit, request a non-urgent call back, or ask for a work or school excuse for 24 hours related to this e-Visit. If it has been greater than 24 hours you will need to follow up with your provider, or enter a new e-Visit to address those concerns.   You will get an e-mail in the next two days asking about your experience.  I hope that your e-visit has been valuable and will speed your recovery. Thank you for using e-visits.   **Please do not respond to this message unless you have follow up questions.** Greater than 5 but less than 10  minutes spent researching, coordinating, and implementing care for this patient today  

## 2020-03-07 NOTE — Addendum Note (Signed)
Addended by: Jannifer Rodney A on: 03/07/2020 05:51 PM   Modules accepted: Orders

## 2020-05-07 ENCOUNTER — Telehealth: Payer: BC Managed Care – PPO | Admitting: Nurse Practitioner

## 2020-05-07 DIAGNOSIS — H65119 Acute and subacute allergic otitis media (mucoid) (sanguinous) (serous), unspecified ear: Secondary | ICD-10-CM

## 2020-05-07 MED ORDER — AMOXICILLIN-POT CLAVULANATE 875-125 MG PO TABS: 1.0000 | ORAL_TABLET | Freq: Two times a day (BID) | ORAL | 0 refills | Status: DC

## 2020-05-07 NOTE — Progress Notes (Signed)
E Visit for Swimmer's Ear  We are sorry that you are not feeling well. Here is how we plan to help!  Augmentin 625mg one tablet by mouth twice a day for 10 days  In certain cases swimmer's ear may progress to a more serious bacterial infection of the middle or inner ear.  If you have a fever 102 and up and significantly worsening symptoms, this could indicate a more serious infection moving to the middle/inner and needs face to face evaluation in an office by a provider.  Your symptoms should improve over the next 3 days and should resolve in about 7 days.  HOME CARE:   Wash your hands frequently.  Do not place the tip of the bottle on your ear or touch it with your fingers.  You can take Acetominophen 650 mg every 4-6 hours as needed for pain.  If pain is severe or moderate, you can apply a heating pad (set on low) or hot water bottle (wrapped in a towel) to outer ear for 20 minutes.  This will also increase drainage.  Avoid ear plugs  Do not use Q-tips  After showers, help the water run out by tilting your head to one side.  GET HELP RIGHT AWAY IF:   Fever is over 102.2 degrees.  You develop progressive ear pain or hearing loss.  Ear symptoms persist longer than 3 days after treatment.  MAKE SURE YOU:   Understand these instructions.  Will watch your condition.  Will get help right away if you are not doing well or get worse.  TO PREVENT SWIMMER'S EAR:  Use a bathing cap or custom fitted swim molds to keep your ears dry.  Towel off after swimming to dry your ears.  Tilt your head or pull your earlobes to allow the water to escape your ear canal.  If there is still water in your ears, consider using a hairdryer on the lowest setting.  Thank you for choosing an e-visit. Your e-visit answers were reviewed by a board certified advanced clinical practitioner to complete your personal care plan. Depending upon the condition, your plan could have included both over  the counter or prescription medications. Please review your pharmacy choice. Be sure that the pharmacy you have chosen is open so that you can pick up your prescription now.  If there is a problem you may message your provider in MyChart to have the prescription routed to another pharmacy. Your safety is important to us. If you have drug allergies check your prescription carefully.  For the next 24 hours, you can use MyChart to ask questions about today's visit, request a non-urgent call back, or ask for a work or school excuse from your e-visit provider. You will get an email in the next two days asking about your experience. I hope that your e-visit has been valuable and will speed your recovery.    5-10 minutes spent reviewing and documenting in chart.     

## 2020-05-08 MED ORDER — CIPROFLOXACIN-DEXAMETHASONE 0.3-0.1 % OT SUSP: 4.0000 [drp] | Freq: Two times a day (BID) | OTIC | 0 refills | Status: AC

## 2020-05-08 NOTE — Addendum Note (Signed)
Addended by: Roxy Horseman B on: 05/08/2020 01:00 PM   Modules accepted: Orders

## 2020-05-12 ENCOUNTER — Telehealth: Payer: BC Managed Care – PPO | Admitting: Physician Assistant

## 2020-05-12 DIAGNOSIS — N760 Acute vaginitis: Secondary | ICD-10-CM

## 2020-05-12 MED ORDER — FLUCONAZOLE 150 MG PO TABS: 150.0000 mg | ORAL_TABLET | Freq: Once | ORAL | 0 refills | Status: AC

## 2020-05-12 NOTE — Progress Notes (Signed)

## 2020-07-21 ENCOUNTER — Other Ambulatory Visit: Payer: Self-pay | Admitting: Internal Medicine

## 2020-07-21 DIAGNOSIS — F419 Anxiety disorder, unspecified: Secondary | ICD-10-CM

## 2020-08-03 ENCOUNTER — Other Ambulatory Visit: Payer: Self-pay | Admitting: Internal Medicine

## 2020-08-03 DIAGNOSIS — F419 Anxiety disorder, unspecified: Secondary | ICD-10-CM

## 2020-08-04 ENCOUNTER — Encounter: Payer: Self-pay | Admitting: *Deleted

## 2020-08-04 ENCOUNTER — Other Ambulatory Visit: Payer: Self-pay | Admitting: Internal Medicine

## 2020-08-04 DIAGNOSIS — F419 Anxiety disorder, unspecified: Secondary | ICD-10-CM

## 2020-08-04 NOTE — Telephone Encounter (Signed)
Has been One year since last OV ok to fill Effexor? I have sent my chart message advising patient she needs fu.

## 2020-08-04 NOTE — Telephone Encounter (Signed)
Patient was told by her pharmacy that venlafaxine XR (EFFEXOR-XR) 75 MG 24 hr capsule she takes has been denied by her provider. Patient is requesting a refill.

## 2020-08-05 ENCOUNTER — Other Ambulatory Visit: Payer: Self-pay | Admitting: Internal Medicine

## 2020-08-05 ENCOUNTER — Encounter: Payer: Self-pay | Admitting: Internal Medicine

## 2020-08-05 ENCOUNTER — Other Ambulatory Visit: Payer: Self-pay

## 2020-08-05 DIAGNOSIS — F419 Anxiety disorder, unspecified: Secondary | ICD-10-CM

## 2020-08-05 MED ORDER — VENLAFAXINE HCL ER 75 MG PO CP24: 75.0000 mg | ORAL_CAPSULE | Freq: Every day | ORAL | 0 refills | Status: DC

## 2020-08-05 NOTE — Telephone Encounter (Signed)
I sent you a refill request on this patient she has scheduled a follow up ok to refill Effexor?

## 2020-08-21 ENCOUNTER — Other Ambulatory Visit: Payer: Self-pay

## 2020-08-21 ENCOUNTER — Ambulatory Visit (INDEPENDENT_AMBULATORY_CARE_PROVIDER_SITE_OTHER): Payer: Self-pay | Admitting: Internal Medicine

## 2020-08-21 ENCOUNTER — Encounter: Payer: Self-pay | Admitting: Internal Medicine

## 2020-08-21 VITALS — BP 118/80 | HR 93 | Ht 62.0 in | Wt 160.2 lb

## 2020-08-21 DIAGNOSIS — G4700 Insomnia, unspecified: Secondary | ICD-10-CM

## 2020-08-21 DIAGNOSIS — Z0000 Encounter for general adult medical examination without abnormal findings: Secondary | ICD-10-CM

## 2020-08-21 DIAGNOSIS — Z1329 Encounter for screening for other suspected endocrine disorder: Secondary | ICD-10-CM

## 2020-08-21 DIAGNOSIS — Z13220 Encounter for screening for lipoid disorders: Secondary | ICD-10-CM

## 2020-08-21 DIAGNOSIS — E538 Deficiency of other specified B group vitamins: Secondary | ICD-10-CM

## 2020-08-21 DIAGNOSIS — F32A Depression, unspecified: Secondary | ICD-10-CM

## 2020-08-21 DIAGNOSIS — F419 Anxiety disorder, unspecified: Secondary | ICD-10-CM

## 2020-08-21 DIAGNOSIS — E559 Vitamin D deficiency, unspecified: Secondary | ICD-10-CM

## 2020-08-21 DIAGNOSIS — Z1389 Encounter for screening for other disorder: Secondary | ICD-10-CM

## 2020-08-21 MED ORDER — ESZOPICLONE 1 MG PO TABS: 1.0000 mg | ORAL_TABLET | Freq: Every evening | ORAL | 1 refills | Status: DC | PRN

## 2020-08-21 MED ORDER — BUPROPION HCL ER (XL) 150 MG PO TB24: ORAL_TABLET | ORAL | 3 refills | Status: DC

## 2020-08-21 MED ORDER — VENLAFAXINE HCL ER 75 MG PO CP24: 75.0000 mg | ORAL_CAPSULE | Freq: Every day | ORAL | 0 refills | Status: DC

## 2020-08-21 MED ORDER — CHOLECALCIFEROL 1.25 MG (50000 UT) PO CAPS
50000.0000 [IU] | ORAL_CAPSULE | ORAL | 1 refills | Status: DC
Start: 2020-08-21 — End: 2022-12-14

## 2020-08-21 NOTE — Progress Notes (Signed)
Chief Complaint  Patient presents with  . Medication Refill   Annual  1. Anxiety increased her husband drinking alcohol she feels safe but they argue at times and stress from kids at work cause her mind to race and not be able to sleep at night on effexor xr 75 mg qd and wellbutrin 150 xl but does not like how effexor makes her feel when she skips a dose. No taking klonopin 0.5 mg bid she takes melatonin 10-20 mg qhs but walks up throughout the night   Review of Systems  Constitutional: Negative for weight loss.  HENT: Negative for hearing loss.   Eyes: Negative for blurred vision.  Respiratory: Negative for shortness of breath.   Cardiovascular: Negative for chest pain.  Gastrointestinal: Negative for abdominal pain.  Musculoskeletal: Negative for falls and joint pain.  Skin: Negative for rash.  Neurological: Negative for headaches.  Psychiatric/Behavioral: Positive for depression. The patient is nervous/anxious and has insomnia.    Past Medical History:  Diagnosis Date  . Anxiety   . Complete miscarriage   . COVID-19    +fatigue+ 05/2020 better 3-4 days  . Depression    postpartum   . IBS (irritable bowel syndrome)    constipation  . Kidney stones   . Melasma    lip  . Psoriasis    on Humira Dr. Phillip Heal   . UTI (urinary tract infection)    Past Surgical History:  Procedure Laterality Date  . CERVICAL BIOPSY    . KNEE SURGERY     cyst removal  . UTERINE SEPTUM RESECTION  04/2012   Dr. March Rummage at [facility] History  Problem Relation Age of Onset  . Rashes / Skin problems Mother   . Other Mother        abnormal mammogram but not cancer  . Hyperlipidemia Father   . Rashes / Skin problems Father   . Rashes / Skin problems Sister   . Breast cancer Paternal Aunt    Social History   Socioeconomic History  . Marital status: Married    Spouse name: Not on file  . Number of children: Not on file  . Years of education: Not on file  . Highest education level: Not on  file  Occupational History  . Not on file  Tobacco Use  . Smoking status: Never Smoker  . Smokeless tobacco: Never Used  Substance and Sexual Activity  . Alcohol use: No    Alcohol/week: 0.0 standard drinks  . Drug use: No  . Sexual activity: Yes    Partners: Male  Other Topics Concern  . Not on file  Social History Narrative   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    Social Determinants of Health   Financial Resource Strain: Not on file  Food Insecurity: Not on file  Transportation Needs: Not on file  Physical Activity: Not on file  Stress: Not on file  Social Connections: Not on file  Intimate Partner Violence: Not on file   Current Meds  Medication Sig  . Cholecalciferol 1.25 MG (50000 UT) capsule Take 1 capsule (50,000 Units total) by mouth every 30 (thirty) days. Vitamin D3  . eszopiclone (LUNESTA) 1 MG TABS tablet Take 1 tablet (1 mg total) by mouth at bedtime as needed for sleep. Take immediately before bedtime  . SKYRIZI, 150 MG DOSE, 75 MG/0.83ML PSKT 2 doses 0 month, 1 month then every 3 months  Dr. Kellie Moor  . [DISCONTINUED] buPROPion [facility]  XL) 150 MG 24 hr tablet TAKE 1 TABLET(150 MG) BY MOUTH DAILY  . [DISCONTINUED] venlafaxine XR (EFFEXOR-XR) 75 MG 24 hr capsule Take 1 capsule (75 mg total) by mouth daily with breakfast.   No Known Allergies No results found for this or any previous visit (from the past 2160 hour(s)). Objective  Body mass index is 29.31 kg/m. Wt Readings from Last 3 Encounters:  08/21/20 160 lb 4 oz (72.7 kg)  08/01/19 166 lb 6.4 oz (75.5 kg)  03/20/19 160 lb (72.6 kg)   Temp Readings from Last 3 Encounters:  08/01/19 98 F (36.7 C) (Temporal)  03/20/19 97.8 F (36.6 C) (Oral)  07/14/18 98.6 F (37 C) (Oral)   BP Readings from Last 3 Encounters:  08/21/20 118/80  08/01/19 118/80  03/20/19 110/82   Pulse Readings from Last 3 Encounters:  08/21/20 93  08/01/19 (!) 113  03/20/19 94    Physical Exam Vitals and  nursing note reviewed.  Constitutional:      Appearance: Normal appearance. She is well-developed, well-groomed and overweight.  HENT:     Head: Normocephalic and atraumatic.  Cardiovascular:     Rate and Rhythm: Normal rate and regular rhythm.     Heart sounds: Normal heart sounds. No murmur heard.   Pulmonary:     Effort: Pulmonary effort is normal.     Breath sounds: Normal breath sounds.  Abdominal:     Tenderness: There is no abdominal tenderness.  Skin:    General: Skin is warm and dry.  Neurological:     General: No focal deficit present.     Mental Status: She is alert and oriented to person, place, and time. Mental status is at baseline.     Gait: Gait normal.  Psychiatric:        Attention and Perception: Attention and perception normal.        Mood and Affect: Mood and affect normal.        Speech: Speech normal.        Behavior: Behavior normal. Behavior is cooperative.        Thought Content: Thought content normal.        Cognition and Memory: Cognition and memory normal.        Judgment: Judgment normal.     Assessment  Plan  Annual physical exam - Plan: Comprehensive metabolic panel, Lipid panel, CBC w/Diff, TSH, Urinalysis, Routine w reflex microscopic, Vitamin D (25 hydroxy), Vitamin B12  Vitamin D deficiency - Plan: Cholecalciferol 1.25 MG (50000 UT) capsule Q month, Vitamin D (25 hydroxy)  Anxiety - Plan: venlafaxine XR (EFFEXOR-XR) 75 MG 24 hr capsule Anxiety and depression - Plan: buPROPion (WELLBUTRIN XL) 150 MG 24 hr tablet Insomnia, unspecified type - Plan: eszopiclone (LUNESTA) 1 MG TABS tablet Consider add back klonopin 0.5 mg bid if lunesta does not work for sleep she declines refill for now  [employer] calm, insight timer, headspace, replika    [facility]  [place] [id number] [phone]   [facility] [place]  [address]   [place] [address]  [phone]    B12 deficiency - Plan: Vitamin B12 consider B12 1000 mcg qd  HM Declines flu shot Tdaphad 4/3/12will get with labs 08/2020  -prevnar had moderna 07/10/20 3rd dose utd   Had MMR and hep B vaccines  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 Phillip Heal [facility] for PSA Last f/u 07/2020 nl skin exam    rec healthy diet and exercise    Provider: Dr. Olivia Mackie McLean-Scocuzza-[facility]

## 2020-08-21 NOTE — Patient Instructions (Addendum)
B12 1000 mcg daily  Vitamin D could consider 50K 1x per month   Debrox ear wax drops   Tdap due 08/24/2020 tetanus shot   reframe app for etoh   Meditation apps calm, insight timer, headspace, replika    [facility]  [address] [place] [address] [phone]   [facility]  [address]  [place] [address]  [phone]  Eszopiclone tablets What is this medicine? ESZOPICLONE (es ZOE pi clone) is used to treat insomnia. This medicine helps you to fall asleep and sleep through the night. This medicine may be used for other purposes; ask your health care provider or pharmacist if you have questions. COMMON BRAND NAME(S): Lunesta What should I tell my health care provider before I take this medicine? They need to know if you have any of these conditions:  depression  history of a drug or alcohol abuse problem  liver disease  lung or breathing disease  sleep-walking, driving, eating or other activity while not fully awake after taking a sleep medicine  suicidal thoughts  an unusual or allergic reaction to eszopiclone, other medicines, foods, dyes, or preservatives  pregnant or trying to get pregnant  breast-feeding How should I use this medicine? Take this medicine by mouth with a glass of water. Follow the directions on the prescription label. It is better to take this medicine on an empty stomach and only when you are ready for bed. Do not take your medicine more often than directed. If you have been taking this medicine for several weeks and suddenly stop taking it, you may get unpleasant withdrawal symptoms. Your doctor or health care professional may want to gradually reduce the dose. Do not stop taking this medicine on your own. Always follow your doctor or health care professional's advice. Talk to your pediatrician regarding the use of this medicine in children.  Special care may be needed. Overdosage: If you think you have taken too much of this medicine contact a poison control center or emergency room at once. NOTE: This medicine is only for you. Do not share this medicine with others. What if I miss a dose? This does not apply. This medicine should only be taken immediately before going to sleep. Do not take double or extra doses. What may interact with this medicine?  herbal medicines like kava kava, melatonin, St. John's wort and valerian  lorazepam  medicines for fungal infections like ketoconazole, fluconazole, or itraconazole  olanzapine This list may not describe all possible interactions. Give your health care provider a list of all the medicines, herbs, non-prescription drugs, or dietary supplements you use. Also tell them if you smoke, drink alcohol, or use illegal drugs. Some items may interact with your medicine. What should I watch for while using this medicine? Visit your doctor or health care professional for regular checks on your progress. Keep a regular sleep schedule by going to bed at about the same time nightly. Avoid caffeine-containing drinks in the evening hours, as caffeine can cause trouble with falling asleep. Talk to your doctor if you still have trouble sleeping. After taking this medicine, you may get up out of bed and do an activity that you do not know you are doing. The next morning, you may have no memory of this. Activities include driving a car ("sleep-driving"), making and eating food, talking on the phone, sexual activity, and sleep-walking. Serious injuries have occurred. Stop the medicine  and call your doctor right away if you find out you have done any of these activities. Do not take this medicine if you have used alcohol that evening. Do not take it if you have taken another medicine for sleep. The risk of doing these sleep-related activities is higher. Do not take this medicine unless you are able to stay in  bed for a full night (7 to 8 hours) before you must be active again. You may have a decrease in mental alertness the day after use, even if you feel that you are fully awake. Tell your doctor if you will need to perform activities requiring full alertness, such as driving, the next day. Do not stand or sit up quickly after taking this medicine, especially if you are an older patient. This reduces the risk of dizzy or fainting spells. If you or your family notice any changes in your behavior, such as new or worsening depression, thoughts of harming yourself, anxiety, other unusual or disturbing thoughts, or memory loss, call your doctor right away. After you stop taking this medicine, you may have trouble falling asleep. This is called rebound insomnia. This problem usually goes away on its own after 1 or 2 nights. What side effects may I notice from receiving this medicine? Side effects that you should report to your doctor or health care professional as soon as possible:  allergic reactions like skin rash, itching or hives, swelling of the face, lips, or tongue  changes in vision  confusion  depressed mood  feeling faint or lightheaded, falls  hallucinations  problems with balance, speaking, walking  restlessness, excitability, or feelings of agitation  unusual activities while not fully awake like driving, eating, making phone calls Side effects that usually do not require medical attention (report to your doctor or health care professional if they continue or are bothersome):  dizziness, or daytime drowsiness, sometimes called a hangover effect  headache This list may not describe all possible side effects. Call your doctor for medical advice about side effects. You may report side effects to FDA at 1-800-FDA-1088. Where should I keep my medicine? Keep out of the reach of children. This medicine can be abused. Keep your medicine in a safe place to protect it from theft. Do not share  this medicine with anyone. Selling or giving away this medicine is dangerous and against the law. This medicine may cause accidental overdose and death if taken by other adults, children, or pets. Mix any unused medicine with a substance like cat litter or coffee grounds. Then throw the medicine away in a sealed container like a sealed bag or a coffee can with a lid. Do not use the medicine after the expiration date. Store at room temperature between 15 and 30 degrees C (59 and 86 degrees F). NOTE: This sheet is a summary. It may not cover all possible information. If you have questions about this medicine, talk to your doctor, pharmacist, or health care provider.  2021 Elsevier/Gold Standard (2017-11-04 11:57:05)     Tdap (Tetanus, Diphtheria, Pertussis) Vaccine: What You Need to Know 1. Why get vaccinated? Tdap vaccine can prevent tetanus, diphtheria, and pertussis. Diphtheria and pertussis spread from person to person. Tetanus enters the body through cuts or wounds.  TETANUS (T) causes painful stiffening of the muscles. Tetanus can lead to serious health problems, including being unable to open the mouth, having trouble swallowing and breathing, or death.  DIPHTHERIA (D) can lead to difficulty breathing, heart failure, paralysis, or  death.  PERTUSSIS (aP), also known as "whooping cough," can cause uncontrollable, violent coughing that makes it hard to breathe, eat, or drink. Pertussis can be extremely serious especially in babies and young children, causing pneumonia, convulsions, brain damage, or death. In teens and adults, it can cause weight loss, loss of bladder control, passing out, and rib fractures from severe coughing. 2. Tdap vaccine Tdap is only for children 7 years and older, adolescents, and adults.  Adolescents should receive a single dose of Tdap, preferably at age 34 or 12 years. Pregnant people should get a dose of Tdap during every pregnancy, preferably during the early  part of the third trimester, to help protect the newborn from pertussis. Infants are most at risk for severe, life-threatening complications from pertussis. Adults who have never received Tdap should get a dose of Tdap. Also, adults should receive a booster dose of either Tdap or Td (a different vaccine that protects against tetanus and diphtheria but not pertussis) every 10 years, or after 5 years in the case of a severe or dirty wound or burn. Tdap may be given at the same time as other vaccines. 3. Talk with your health care provider Tell your vaccine provider if the person getting the vaccine:  Has had an allergic reaction after a previous dose of any vaccine that protects against tetanus, diphtheria, or pertussis, or has any severe, life-threatening allergies  Has had a coma, decreased level of consciousness, or prolonged seizures within 7 days after a previous dose of any pertussis vaccine (DTP, DTaP, or Tdap)  Has seizures or another nervous system problem  Has ever had Guillain-Barr Syndrome (also called "GBS")  Has had severe pain or swelling after a previous dose of any vaccine that protects against tetanus or diphtheria In some cases, your health care provider may decide to postpone Tdap vaccination until a future visit. People with minor illnesses, such as a cold, may be vaccinated. People who are moderately or severely ill should usually wait until they recover before getting Tdap vaccine.  Your health care provider can give you more information. 4. Risks of a vaccine reaction  Pain, redness, or swelling where the shot was given, mild fever, headache, feeling tired, and nausea, vomiting, diarrhea, or stomachache sometimes happen after Tdap vaccination. People sometimes faint after medical procedures, including vaccination. Tell your provider if you feel dizzy or have vision changes or ringing in the ears.  As with any medicine, there is a very remote chance of a vaccine causing a  severe allergic reaction, other serious injury, or death. 5. What if there is a serious problem? An allergic reaction could occur after the vaccinated person leaves the clinic. If you see signs of a severe allergic reaction (hives, swelling of the face and throat, difficulty breathing, a fast heartbeat, dizziness, or weakness), call 9-1-1 and get the person to the nearest hospital. For other signs that concern you, call your health care provider.  Adverse reactions should be reported to the Vaccine Adverse Event Reporting System (VAERS). Your health care provider will usually file this report, or you can do it yourself. Visit the VAERS website at www.vaers.LAgents.no or call 747-299-0970. VAERS is only for reporting reactions, and VAERS staff members do not give medical advice. 6. The National Vaccine Injury Compensation Program The [employer] Vaccine Injury Compensation Program (VICP) is a federal program that was created to compensate people who may have been injured by certain vaccines. Claims regarding alleged injury or death due to vaccination have  a time limit for filing, which may be as short as two years. Visit the VICP website at SpiritualWord.at or call (805)248-6361 to learn about the program and about filing a claim. 7. How can I learn more?  Ask your health care provider.  Call your local or state health department.  Visit the website of the Food and Drug Administration (FDA) for vaccine package inserts and additional information at FinderList.no.  Contact the Centers for Disease Control and Prevention (CDC): ? Call 806 557 6195 (1-800-CDC-INFO) or ? Visit CDC's website at PicCapture.uy. Vaccine Information Statement Tdap (Tetanus, Diphtheria, Pertussis) Vaccine (12/28/2019) This information is not intended to replace advice given to you by your health care provider. Make sure you discuss any questions you have with your health  care provider. Document Revised: 01/23/2020 Document Reviewed: 01/23/2020 Elsevier Patient Education  2021 Elsevier Inc.  Mindfulness-Based Stress Reduction Mindfulness-based stress reduction (MBSR) is a program that helps people learn to practice mindfulness. Mindfulness is the practice of intentionally paying attention to the present moment. MBSR focuses on developing self-awareness, which allows you to respond to life stress without judgment or negative emotions. It can be learned and practiced through techniques such as education, breathing exercises, meditation, and yoga. MBSR includes several mindfulness techniques in one program. MBSR works best when you understand the treatment, are willing to try new things, and can commit to spending time practicing what you learn. MBSR training may include learning about:  How your emotions, thoughts, and reactions affect your body.  New ways to respond to things that cause negative thoughts to start (triggers).  How to notice your thoughts and let go of them.  Practicing awareness of everyday things that you normally do without thinking.  The techniques and goals of different types of meditation. What are the benefits of MBSR? MBSR can have many benefits, which include helping you to:  Develop self-awareness. This refers to knowing and understanding yourself.  Learn skills and attitudes that help you to participate in your own health care.  Learn new ways to care for yourself.  Be more accepting about how things are, and let things go.  Be less judgmental and approach things with an open mind.  Be patient with yourself and trust yourself more. MBSR has also been shown to:  Reduce negative emotions, such as depression and anxiety.  Improve memory and focus.  Change how you sense and approach pain.  Boost your body's ability to fight infections.  Help you connect better with other people.  Improve your sense of  well-being. Follow these instructions at home:  Find a local in-person or online MBSR program.  Set aside some time regularly for mindfulness practice.  Find a mindfulness practice that works best for you. This may include one or more of the following: ? Meditation. Meditation involves focusing your mind on a certain thought or activity. ? Breathing awareness exercises. These help you to stay present by focusing on your breath. ? Body scan. For this practice, you lie down and pay attention to each part of your body from head to toe. You can identify tension and soreness and intentionally relax parts of your body. ? Yoga. Yoga involves stretching and breathing, and it can improve your ability to move and be flexible. It can also provide an experience of testing your body's limits, which can help you release stress. ? Mindful eating. This way of eating involves focusing on the taste, texture, color, and smell of each bite of food. Because this slows  down eating and helps you feel full sooner, it can be an important part of a weight-loss plan.  Find a podcast or recording that provides guidance for breathing awareness, body scan, or meditation exercises. You can listen to these any time when you have a free moment to rest without distractions.  Follow your treatment plan as told by your health care provider. This may include taking regular medicines and making changes to your diet or lifestyle as recommended.   How to practice mindfulness To do a basic awareness exercise:  Find a comfortable place to sit.  Pay attention to the present moment. Observe your thoughts, feelings, and surroundings just as they are.  Avoid placing judgment on yourself, your feelings, or your surroundings. Make note of any judgment that comes up, and let it go.  Your mind may wander, and that is okay. Make note of when your thoughts drift, and return your attention to the present moment. To do basic mindfulness  meditation:  Find a comfortable place to sit. This may include a stable chair or a firm floor cushion. ? Sit upright with your back straight. Let your arms fall next to your side with your hands resting on your legs. ? If sitting in a chair, rest your feet flat on the floor. ? If sitting on a cushion, cross your legs in front of you.  Keep your head in a neutral position with your chin dropped slightly. Relax your jaw and rest the tip of your tongue on the roof of your mouth. Drop your gaze to the floor. You can close your eyes if you like.  Breathe normally and pay attention to your breath. Feel the air moving in and out of your nose. Feel your belly expanding and relaxing with each breath.  Your mind may wander, and that is okay. Make note of when your thoughts drift, and return your attention to your breath.  Avoid placing judgment on yourself, your feelings, or your surroundings. Make note of any judgment or feelings that come up, let them go, and bring your attention back to your breath.  When you are ready, lift your gaze or open your eyes. Pay attention to how your body feels after the meditation. Where to find more information You can find more information about MBSR from:  Your health care provider.  Community-based meditation centers or programs.  Programs offered near you. Summary  Mindfulness-based stress reduction (MBSR) is a program that teaches you how to intentionally pay attention to the present moment. It is used with other treatments to help you cope better with daily stress, emotions, and pain.  MBSR focuses on developing self-awareness, which allows you to respond to life stress without judgment or negative emotions.  MBSR programs may involve learning different mindfulness practices, such as breathing exercises, meditation, yoga, body scan, or mindful eating. Find a mindfulness practice that works best for you, and set aside time for it on a regular basis. This  information is not intended to replace advice given to you by your health care provider. Make sure you discuss any questions you have with your health care provider. Document Revised: 01/25/2020 Document Reviewed: 01/25/2020 Elsevier Patient Education  2021 Elsevier Inc.  Managing Anxiety, Adult After being diagnosed with an anxiety disorder, you may be relieved to know why you have felt or behaved a certain way. You may also feel overwhelmed about the treatment ahead and what it will mean for your life. With care and support, you can  manage this condition and recover from it. How to manage lifestyle changes Managing stress and anxiety Stress is your body's reaction to life changes and events, both good and bad. Most stress will last just a few hours, but stress can be ongoing and can lead to more than just stress. Although stress can play a major role in anxiety, it is not the same as anxiety. Stress is usually caused by something external, such as a deadline, test, or competition. Stress normally passes after the triggering event has ended.  Anxiety is caused by something internal, such as imagining a terrible outcome or worrying that something will go wrong that will devastate you. Anxiety often does not go away even after the triggering event is over, and it can become long-term (chronic) worry. It is important to understand the differences between stress and anxiety and to manage your stress effectively so that it does not lead to an anxious response. Talk with your health care provider or a counselor to learn more about reducing anxiety and stress. He or she may suggest tension reduction techniques, such as:  Music therapy. This can include creating or listening to music that you enjoy and that inspires you.  Mindfulness-based meditation. This involves being aware of your normal breaths while not trying to control your breathing. It can be done while sitting or walking.  Centering prayer. This  involves focusing on a word, phrase, or sacred image that means something to you and brings you peace.  Deep breathing. To do this, expand your stomach and inhale slowly through your nose. Hold your breath for 3-5 seconds. Then exhale slowly, letting your stomach muscles relax.  Self-talk. This involves identifying thought patterns that lead to anxiety reactions and changing those patterns.  Muscle relaxation. This involves tensing muscles and then relaxing them. Choose a tension reduction technique that suits your lifestyle and personality. These techniques take time and practice. Set aside 5-15 minutes a day to do them. Therapists can offer counseling and training in these techniques. The training to help with anxiety may be covered by some insurance plans. Other things you can do to manage stress and anxiety include:  Keeping a stress/anxiety diary. This can help you learn what triggers your reaction and then learn ways to manage your response.  Thinking about how you react to certain situations. You may not be able to control everything, but you can control your response.  Making time for activities that help you relax and not feeling guilty about spending your time in this way.  Visual imagery and yoga can help you stay calm and relax.   Medicines Medicines can help ease symptoms. Medicines for anxiety include:  Anti-anxiety drugs.  Antidepressants. Medicines are often used as a primary treatment for anxiety disorder. Medicines will be prescribed by a health care provider. When used together, medicines, psychotherapy, and tension reduction techniques may be the most effective treatment. Relationships Relationships can play a big part in helping you recover. Try to spend more time connecting with trusted friends and family members. Consider going to couples counseling, taking family education classes, or going to family therapy. Therapy can help you and others better understand your  condition. How to recognize changes in your anxiety Everyone responds differently to treatment for anxiety. Recovery from anxiety happens when symptoms decrease and stop interfering with your daily activities at home or work. This may mean that you will start to:  Have better concentration and focus. Worry will interfere less in your daily  thinking.  Sleep better.  Be less irritable.  Have more energy.  Have improved memory. It is important to recognize when your condition is getting worse. Contact your health care provider if your symptoms interfere with home or work and you feel like your condition is not improving. Follow these instructions at home: Activity  Exercise. Most adults should do the following: ? Exercise for at least 150 minutes each week. The exercise should increase your heart rate and make you sweat (moderate-intensity exercise). ? Strengthening exercises at least twice a week.  Get the right amount and quality of sleep. Most adults need 7-9 hours of sleep each night. Lifestyle  Eat a healthy diet that includes plenty of vegetables, fruits, whole grains, low-fat dairy products, and lean protein. Do not eat a lot of foods that are high in solid fats, added sugars, or salt.  Make choices that simplify your life.  Do not use any products that contain nicotine or tobacco, such as cigarettes, e-cigarettes, and chewing tobacco. If you need help quitting, ask your health care provider.  Avoid caffeine, alcohol, and certain over-the-counter cold medicines. These may make you feel worse. Ask your pharmacist which medicines to avoid.   General instructions  Take over-the-counter and prescription medicines only as told by your health care provider.  Keep all follow-up visits as told by your health care provider. This is important. Where to find support You can get help and support from these sources:  Self-help groups.  Online and [employer].  A trusted  spiritual leader.  Couples counseling.  Family education classes.  Family therapy. Where to find more information You may find that joining a support group helps you deal with your anxiety. The following sources can help you locate counselors or support groups near you:  Mental Health America: www.mentalhealthamerica.net  Anxiety and Depression Association of [place] (ADAA): ProgramCam.de  [employer] on Mental Illness (NAMI): www.nami.org Contact a health care provider if you:  Have a hard time staying focused or finishing daily tasks.  Spend many hours a day feeling worried about everyday life.  Become exhausted by worry.  Start to have headaches, feel tense, or have nausea.  Urinate more than normal.  Have diarrhea. Get help right away if you have:  A racing heart and shortness of breath.  Thoughts of hurting yourself or others. If you ever feel like you may hurt yourself or others, or have thoughts about taking your own life, get help right away. You can go to your nearest emergency department or call:  Your local emergency services (911 in the [place]  A suicide crisis helpline, such as the National Suicide Prevention Lifeline at 760-671-6395. This is open 24 hours a day. Summary  Taking steps to learn and use tension reduction techniques can help calm you and help prevent triggering an anxiety reaction.  When used together, medicines, psychotherapy, and tension reduction techniques may be the most effective treatment.  Family, friends, and partners can play a big part in helping you recover from an anxiety disorder. This information is not intended to replace advice given to you by your health care provider. Make sure you discuss any questions you have with your health care provider. Document Revised: 10/10/2018 Document Reviewed: 10/10/2018 Elsevier Patient Education  2021 Elsevier Inc.  Insomnia Insomnia is a sleep disorder that makes it difficult to  fall asleep or stay asleep. Insomnia can cause fatigue, low energy, difficulty concentrating, mood swings, and poor performance at work or school. There  are three different ways to classify insomnia:  Difficulty falling asleep.  Difficulty staying asleep.  Waking up too early in the morning. Any type of insomnia can be long-term (chronic) or short-term (acute). Both are common. Short-term insomnia usually lasts for three months or less. Chronic insomnia occurs at least three times a week for longer than three months. What are the causes? Insomnia may be caused by another condition, situation, or substance, such as:  Anxiety.  Certain medicines.  Gastroesophageal reflux disease (GERD) or other gastrointestinal conditions.  Asthma or other breathing conditions.  Restless legs syndrome, sleep apnea, or other sleep disorders.  Chronic pain.  Menopause.  Stroke.  Abuse of alcohol, tobacco, or illegal drugs.  Mental health conditions, such as depression.  Caffeine.  Neurological disorders, such as Alzheimer's disease.  An overactive thyroid (hyperthyroidism). Sometimes, the cause of insomnia may not be known. What increases the risk? Risk factors for insomnia include:  Gender. Women are affected more often than men.  Age. Insomnia is more common as you get older.  Stress.  Lack of exercise.  Irregular work schedule or working night shifts.  Traveling between different time zones.  Certain medical and mental health conditions. What are the signs or symptoms? If you have insomnia, the main symptom is having trouble falling asleep or having trouble staying asleep. This may lead to other symptoms, such as:  Feeling fatigued or having low energy.  Feeling nervous about going to sleep.  Not feeling rested in the morning.  Having trouble concentrating.  Feeling irritable, anxious, or depressed. How is this diagnosed? This condition may be diagnosed based  on:  Your symptoms and medical history. Your health care provider may ask about: ? Your sleep habits. ? Any medical conditions you have. ? Your mental health.  A physical exam. How is this treated? Treatment for insomnia depends on the cause. Treatment may focus on treating an underlying condition that is causing insomnia. Treatment may also include:  Medicines to help you sleep.  Counseling or therapy.  Lifestyle adjustments to help you sleep better. Follow these instructions at home: Eating and drinking  Limit or avoid alcohol, caffeinated beverages, and cigarettes, especially close to bedtime. These can disrupt your sleep.  Do not eat a large meal or eat spicy foods right before bedtime. This can lead to digestive discomfort that can make it hard for you to sleep.   Sleep habits  Keep a sleep diary to help you and your health care provider figure out what could be causing your insomnia. Write down: ? When you sleep. ? When you wake up during the night. ? How well you sleep. ? How rested you feel the next day. ? Any side effects of medicines you are taking. ? What you eat and drink.  Make your bedroom a dark, comfortable place where it is easy to fall asleep. ? Put up shades or blackout curtains to block light from outside. ? Use a white noise machine to block noise. ? Keep the temperature cool.  Limit screen use before bedtime. This includes: ? Watching TV. ? Using your smartphone, tablet, or computer.  Stick to a routine that includes going to bed and waking up at the same times every day and night. This can help you fall asleep faster. Consider making a quiet activity, such as reading, part of your nighttime routine.  Try to avoid taking naps during the day so that you sleep better at night.  Get out of bed if  you are still awake after 15 minutes of trying to sleep. Keep the lights down, but try reading or doing a quiet activity. When you feel sleepy, go back to bed.    General instructions  Take over-the-counter and prescription medicines only as told by your health care provider.  Exercise regularly, as told by your health care provider. Avoid exercise starting several hours before bedtime.  Use relaxation techniques to manage stress. Ask your health care provider to suggest some techniques that may work well for you. These may include: ? Breathing exercises. ? Routines to release muscle tension. ? Visualizing peaceful scenes.  Make sure that you drive carefully. Avoid driving if you feel very sleepy.  Keep all follow-up visits as told by your health care provider. This is important. Contact a health care provider if:  You are tired throughout the day.  You have trouble in your daily routine due to sleepiness.  You continue to have sleep problems, or your sleep problems get worse. Get help right away if:  You have serious thoughts about hurting yourself or someone else. If you ever feel like you may hurt yourself or others, or have thoughts about taking your own life, get help right away. You can go to your nearest emergency department or call:  Your local emergency services (911 in the [place]  A suicide crisis helpline, such as the National Suicide Prevention Lifeline at (973) 054-7438. This is open 24 hours a day. Summary  Insomnia is a sleep disorder that makes it difficult to fall asleep or stay asleep.  Insomnia can be long-term (chronic) or short-term (acute).  Treatment for insomnia depends on the cause. Treatment may focus on treating an underlying condition that is causing insomnia.  Keep a sleep diary to help you and your health care provider figure out what could be causing your insomnia. This information is not intended to replace advice given to you by your health care provider. Make sure you discuss any questions you have with your health care provider. Document Revised: 03/20/2020 Document Reviewed: 03/20/2020 Elsevier  Patient Education  2021 [employer].

## 2020-09-11 ENCOUNTER — Ambulatory Visit: Payer: Self-pay

## 2020-09-11 ENCOUNTER — Other Ambulatory Visit: Payer: Self-pay

## 2020-09-11 ENCOUNTER — Encounter: Payer: Self-pay | Admitting: Internal Medicine

## 2020-10-24 ENCOUNTER — Telehealth: Payer: Self-pay | Admitting: Family

## 2020-10-24 DIAGNOSIS — R399 Unspecified symptoms and signs involving the genitourinary system: Secondary | ICD-10-CM

## 2020-10-24 MED ORDER — CEPHALEXIN 500 MG PO CAPS: 500.0000 mg | ORAL_CAPSULE | Freq: Two times a day (BID) | ORAL | 0 refills | Status: DC

## 2020-10-24 NOTE — Progress Notes (Signed)

## 2020-12-09 ENCOUNTER — Telehealth: Payer: Self-pay | Admitting: Internal Medicine

## 2020-12-09 DIAGNOSIS — S62639A Displaced fracture of distal phalanx of unspecified finger, initial encounter for closed fracture: Secondary | ICD-10-CM

## 2020-12-09 HISTORY — DX: Displaced fracture of distal phalanx of unspecified finger, initial encounter for closed fracture: S62.639A

## 2020-12-09 NOTE — Telephone Encounter (Signed)
Agree Dr. Berlyn Saylor McLean-Scocuzza  

## 2020-12-09 NOTE — Telephone Encounter (Signed)
Noted. Awaiting access nurse triage note. 

## 2020-12-09 NOTE — Telephone Encounter (Signed)
Tranfer To Access Nurse  PT called to advise that she is not sure if she broke or sprained her pinky. PT states that it is now black and would like to be seen. Advise we had nothing available to the Access Nurse and transfer.

## 2020-12-09 NOTE — Telephone Encounter (Signed)
Spoke with pt to advise that she either go to [facility] or [facility]. Pt stated that when she got off the phone with the triage nurse she called [facility] and scheduled an appt today for 1pm.

## 2021-02-27 ENCOUNTER — Other Ambulatory Visit: Payer: Self-pay | Admitting: Internal Medicine

## 2021-02-27 DIAGNOSIS — F419 Anxiety disorder, unspecified: Secondary | ICD-10-CM

## 2021-04-29 ENCOUNTER — Telehealth: Payer: Self-pay | Admitting: Nurse Practitioner

## 2021-04-29 DIAGNOSIS — H6500 Acute serous otitis media, unspecified ear: Secondary | ICD-10-CM

## 2021-04-29 MED ORDER — AMOXICILLIN-POT CLAVULANATE 875-125 MG PO TABS: 1.0000 | ORAL_TABLET | Freq: Two times a day (BID) | ORAL | 0 refills | Status: DC

## 2021-04-29 NOTE — Progress Notes (Signed)
E Visit for Swimmer's Ear  We are sorry that you are not feeling well. Here is how we plan to help!   Augmentin 625mg  one tablet by mouth twice a day for 10 days  Based on your symptoms it sounds like you have an ear infection. This is an infection behind to hear drum.  Your symptoms should improve over the next 3 days and should resolve in about 7 days.  HOME CARE:  Wash your hands frequently. Do not place the tip of the bottle on your ear or touch it with your fingers. You can take Acetominophen 650 mg every 4-6 hours as needed for pain.  If pain is severe or moderate, you can apply a heating pad (set on low) or hot water bottle (wrapped in a towel) to outer ear for 20 minutes.  This will also increase drainage. Avoid ear plugs Do not use Q-tips After showers, help the water run out by tilting your head to one side.  GET HELP RIGHT AWAY IF:  Fever is over 102.2 degrees. You develop progressive ear pain or hearing loss. Ear symptoms persist longer than 3 days after treatment.  MAKE SURE YOU:  Understand these instructions. Will watch your condition. Will get help right away if you are not doing well or get worse.  TO PREVENT SWIMMER'S EAR: Use a bathing cap or custom fitted swim molds to keep your ears dry. Towel off after swimming to dry your ears. Tilt your head or pull your earlobes to allow the water to escape your ear canal. If there is still water in your ears, consider using a hairdryer on the lowest setting.   Thank you for choosing an e-visit.  Your e-visit answers were reviewed by a board certified advanced clinical practitioner to complete your personal care plan. Depending upon the condition, your plan could have included both over the counter or prescription medications.  Please review your pharmacy choice. Make sure the pharmacy is open so you can pick up prescription now. If there is a problem, you may contact your provider through and have  the prescription routed to another pharmacy.  Your safety is important to [employer]. If you have drug allergies check your prescription carefully.   For the next 24 hours you can use MyChart to ask questions about today's visit, request a non-urgent call back, or ask for a work or school excuse. You will get an email in the next two days asking about your experience. I hope that your e-visit has been valuable and will speed your recovery.  5-10 minutes spent reviewing and documenting in chart.

## 2021-05-01 ENCOUNTER — Ambulatory Visit: Payer: Self-pay | Admitting: Internal Medicine

## 2021-05-29 ENCOUNTER — Other Ambulatory Visit: Payer: Self-pay | Admitting: Internal Medicine

## 2021-05-29 DIAGNOSIS — F419 Anxiety disorder, unspecified: Secondary | ICD-10-CM

## 2021-08-23 ENCOUNTER — Other Ambulatory Visit: Payer: Self-pay | Admitting: Internal Medicine

## 2021-08-23 DIAGNOSIS — F419 Anxiety disorder, unspecified: Secondary | ICD-10-CM

## 2021-09-03 ENCOUNTER — Telehealth: Payer: Self-pay

## 2021-09-03 ENCOUNTER — Ambulatory Visit: Payer: BC Managed Care – PPO | Admitting: Internal Medicine

## 2021-09-03 ENCOUNTER — Encounter: Payer: Self-pay | Admitting: Internal Medicine

## 2021-09-03 VITALS — BP 120/88 | HR 94 | Temp 98.7°F | Resp 14 | Ht 62.0 in | Wt 176.4 lb

## 2021-09-03 DIAGNOSIS — E538 Deficiency of other specified B group vitamins: Secondary | ICD-10-CM | POA: Diagnosis not present

## 2021-09-03 DIAGNOSIS — E611 Iron deficiency: Secondary | ICD-10-CM | POA: Diagnosis not present

## 2021-09-03 DIAGNOSIS — Z23 Encounter for immunization: Secondary | ICD-10-CM | POA: Diagnosis not present

## 2021-09-03 DIAGNOSIS — E559 Vitamin D deficiency, unspecified: Secondary | ICD-10-CM

## 2021-09-03 DIAGNOSIS — N3000 Acute cystitis without hematuria: Secondary | ICD-10-CM

## 2021-09-03 DIAGNOSIS — R0683 Snoring: Secondary | ICD-10-CM

## 2021-09-03 DIAGNOSIS — Z1329 Encounter for screening for other suspected endocrine disorder: Secondary | ICD-10-CM | POA: Diagnosis not present

## 2021-09-03 DIAGNOSIS — F419 Anxiety disorder, unspecified: Secondary | ICD-10-CM | POA: Diagnosis not present

## 2021-09-03 DIAGNOSIS — Z1389 Encounter for screening for other disorder: Secondary | ICD-10-CM

## 2021-09-03 DIAGNOSIS — Z13220 Encounter for screening for lipoid disorders: Secondary | ICD-10-CM

## 2021-09-03 DIAGNOSIS — R5383 Other fatigue: Secondary | ICD-10-CM

## 2021-09-03 DIAGNOSIS — E669 Obesity, unspecified: Secondary | ICD-10-CM

## 2021-09-03 DIAGNOSIS — F32A Depression, unspecified: Secondary | ICD-10-CM

## 2021-09-03 DIAGNOSIS — G4700 Insomnia, unspecified: Secondary | ICD-10-CM

## 2021-09-03 LAB — CBC WITH DIFFERENTIAL/PLATELET
Basophils Absolute: 0 10*3/uL (ref 0.0–0.1)
Basophils Relative: 0.6 % (ref 0.0–3.0)
Eosinophils Absolute: 0.4 10*3/uL (ref 0.0–0.7)
Eosinophils Relative: 4.5 % (ref 0.0–5.0)
HCT: 44.4 % (ref 36.0–46.0)
Hemoglobin: 14.6 g/dL (ref 12.0–15.0)
Lymphocytes Relative: 19.2 % (ref 12.0–46.0)
Lymphs Abs: 1.6 10*3/uL (ref 0.7–4.0)
MCHC: 32.8 g/dL (ref 30.0–36.0)
MCV: 82.4 fl (ref 78.0–100.0)
Monocytes Absolute: 0.5 10*3/uL (ref 0.1–1.0)
Monocytes Relative: 6.7 % (ref 3.0–12.0)
Neutro Abs: 5.6 10*3/uL (ref 1.4–7.7)
Neutrophils Relative %: 69 % (ref 43.0–77.0)
Platelets: 383 10*3/uL (ref 150.0–400.0)
RBC: 5.39 Mil/uL — ABNORMAL HIGH (ref 3.87–5.11)
RDW: 13.7 % (ref 11.5–15.5)
WBC: 8.2 10*3/uL (ref 4.0–10.5)

## 2021-09-03 LAB — LIPID PANEL
Cholesterol: 209 mg/dL — ABNORMAL HIGH (ref 0–200)
HDL: 61 mg/dL (ref >39.00)
LDL Cholesterol: 123 mg/dL — ABNORMAL HIGH (ref 0–99)
NonHDL: 148
Total CHOL/HDL Ratio: 3
Triglycerides: 123 mg/dL (ref 0.0–149.0)
VLDL: 24.6 mg/dL (ref 0.0–40.0)

## 2021-09-03 LAB — COMPREHENSIVE METABOLIC PANEL
ALT: 20 U/L (ref 0–35)
AST: 20 U/L (ref 0–37)
Albumin: 4.3 g/dL (ref 3.5–5.2)
Alkaline Phosphatase: 57 U/L (ref 39–117)
BUN: 10 mg/dL (ref 6–23)
CO2: 25 mEq/L (ref 19–32)
Calcium: 9.3 mg/dL (ref 8.4–10.5)
Chloride: 104 mEq/L (ref 96–112)
Creatinine, Ser: 0.67 mg/dL (ref 0.40–1.20)
GFR: 110.68 mL/min (ref >60.00)
Glucose, Bld: 98 mg/dL (ref 70–99)
Potassium: 4 mEq/L (ref 3.5–5.1)
Sodium: 135 mEq/L (ref 135–145)
Total Bilirubin: 0.5 mg/dL (ref 0.2–1.2)
Total Protein: 7.3 g/dL (ref 6.0–8.3)

## 2021-09-03 LAB — IBC + FERRITIN
Ferritin: 12.6 ng/mL (ref 10.0–291.0)
Iron: 66 ug/dL (ref 42–145)
Saturation Ratios: 16.1 % — ABNORMAL LOW (ref 20.0–50.0)
TIBC: 410.2 ug/dL (ref 250.0–450.0)
Transferrin: 293 mg/dL (ref 212.0–360.0)

## 2021-09-03 LAB — VITAMIN D 25 HYDROXY (VIT D DEFICIENCY, FRACTURES): VITD: 21.01 ng/mL — ABNORMAL LOW (ref 30.00–100.00)

## 2021-09-03 LAB — VITAMIN B12: Vitamin B-12: 226 pg/mL (ref 211–911)

## 2021-09-03 LAB — TSH: TSH: 1.55 u[IU]/mL (ref 0.35–5.50)

## 2021-09-03 LAB — T4, FREE: Free T4: 0.8 ng/dL (ref 0.60–1.60)

## 2021-09-03 MED ORDER — PHENTERMINE HCL 37.5 MG PO TABS: 37.5000 mg | ORAL_TABLET | Freq: Every day | ORAL | 0 refills | Status: DC

## 2021-09-03 MED ORDER — WEGOVY 0.5 MG/0.5ML ~~LOC~~ SOAJ: 0.5000 mg | SUBCUTANEOUS | 0 refills | Status: DC

## 2021-09-03 MED ORDER — WEGOVY 1 MG/0.5ML ~~LOC~~ SOAJ: 1.0000 mg | SUBCUTANEOUS | 0 refills | Status: DC

## 2021-09-03 MED ORDER — VENLAFAXINE HCL ER 75 MG PO CP24: ORAL_CAPSULE | ORAL | 3 refills | Status: DC

## 2021-09-03 MED ORDER — WEGOVY 1.7 MG/0.75ML ~~LOC~~ SOAJ: 1.7000 mg | SUBCUTANEOUS | 0 refills | Status: DC

## 2021-09-03 MED ORDER — WEGOVY 0.25 MG/0.5ML ~~LOC~~ SOAJ: 0.2500 mg | SUBCUTANEOUS | 0 refills | Status: DC

## 2021-09-03 MED ORDER — WEGOVY 2.4 MG/0.75ML ~~LOC~~ SOAJ: 2.4000 mg | SUBCUTANEOUS | 0 refills | Status: DC

## 2021-09-03 NOTE — Patient Instructions (Addendum)
02/12/2020 Procedure visit [facility]   ?[address]   ?[place], [place] [address]   ?[phone]   Ward, Renee Ramus, MD   ?[address]   ?[facility]   ?[place], [place] [address]   ?[phone]   ?[phone] (Fax)    ? ?Tranquil sleep stress relax brand  ?1-2 at night amazon/whole foods  ? ?Call 1800 on insurance and see whats preferred for sleep ? ?Living With Sleep Apnea ?Sleep apnea is a condition in which breathing pauses or becomes shallow during sleep. Sleep apnea is most commonly caused by a collapsed or blocked airway. People with sleep apnea usually snore loudly. They may have times when they gasp and stop breathing for 10 seconds or more during sleep. This may happen many times during the night. ?The breaks in breathing also interrupt the deep sleep that you need to feel rested. Even if you do not completely wake up from the gaps in breathing, your sleep may not be restful and you feel tired during the day. You may also have a headache in the morning and low energy during the day, and you may feel anxious or depressed. ?How can sleep apnea affect me? ?Sleep apnea increases your chances of extreme tiredness during the day (daytime fatigue). It can also increase your risk for health conditions, such as: ?Heart attack. ?Stroke. ?Obesity. ?Type 2 diabetes. ?Heart failure. ?Irregular heartbeat. ?High blood pressure. ?If you have daytime fatigue as a result of sleep apnea, you may be more likely to: ?Perform poorly at school or work. ?Fall asleep while driving. ?Have difficulty with attention. ?Develop depression or anxiety. ?Have sexual dysfunction. ?What actions can I take to manage sleep apnea? ?Sleep apnea treatment ? ?If you were given a device to open your airway while you sleep, use it only as told by your health care provider. You may be given: ?An oral appliance. This is a custom-made mouthpiece that shifts your lower jaw forward. ?A continuous positive airway pressure  (CPAP) device. This device blows air through a mask when you breathe out (exhale). ?A nasal expiratory positive airway pressure (EPAP) device. This device has valves that you put into each nostril. ?A bi-level positive airway pressure (BIPAP) device. This device blows air through a mask when you breathe in (inhale) and breathe out (exhale). ?You may need surgery if other treatments do not work for you. ?Sleep habits ?Go to sleep and wake up at the same time every day. This helps set your internal clock (circadian rhythm) for sleeping. ?If you stay up later than usual, such as on weekends, try to get up in the morning within 2 hours of your normal wake time. ?Try to get at least 7-9 hours of sleep each night. ?Stop using a computer, tablet, and mobile phone a few hours before bedtime. ?Do not take long naps during the day. If you nap, limit it to 30 minutes. ?Have a relaxing bedtime routine. Reading or listening to music may relax you and help you sleep. ?Use your bedroom only for sleep. ?Keep your television and computer out of your bedroom. ?Keep your bedroom cool, dark, and quiet. ?Use a supportive mattress and pillows. ?Follow your health care provider's instructions for other changes to sleep habits. ?Nutrition ?Do not eat heavy meals in the evening. ?Do not have caffeine in the later part of the day. The effects of caffeine can last for more than 5 hours. ?Follow your health care provider's or dietitian's instructions for any diet changes. ?Lifestyle ?  ?  Do not drink alcohol before bedtime. Alcohol can cause you to fall asleep at first, but then it can cause you to wake up in the middle of the night and have trouble getting back to sleep. ?Do not use any products that contain nicotine or tobacco. These products include cigarettes, chewing tobacco, and vaping devices, such as e-cigarettes. If you need help quitting, ask your health care provider. ?Medicines ?Take over-the-counter and prescription medicines only  as told by your health care provider. ?Do not use over-the-counter sleep medicine. You can become dependent on this medicine, and it can make sleep apnea worse. ?Do not use medicines, such as sedatives and narcotics, unless told by your health care provider. ?Activity ?Exercise on most days, but avoid exercising in the evening. Exercising near bedtime can interfere with sleeping. ?If possible, spend time outside every day. Natural light helps regulate your circadian rhythm. ?General information ?Lose weight if you need to, and maintain a healthy weight. ?Keep all follow-up visits. This is important. ?If you are having surgery, make sure to tell your health care provider that you have sleep apnea. You may need to bring your device with you. ?Where to find more information ?Learn more about sleep apnea and daytime fatigue from: ?American Sleep Association: sleepassociation.org ?National Sleep Foundation: sleepfoundation.org ?National Heart, Lung, and Blood Institute: [employer].glnhlbi.nih.gov ?Summary ?Sleep apnea is a condition in which breathing pauses or becomes shallow during sleep. ?Sleep apnea can cause daytime fatigue and other serious health conditions. ?You may need to wear a device while sleeping to help keep your airway open. ?If you are having surgery, make sure to tell your health care provider that you have sleep apnea. You may need to bring your device with you. ?Making changes to sleep habits, diet, lifestyle, and activity can help you manage sleep apnea. ?This information is not intended to replace advice given to you by your health care provider. Make sure you discuss any questions you have with your health care provider. ?Document Revised: 12/17/2020 Document Reviewed: 04/18/2020 ?Elsevier Patient Education ? 2022 Elsevier Inc. ? ?Semaglutide Injection (Weight Management) ?What is this medication? ?SEMAGLUTIDE (SEM a GLOO tide) promotes weight loss. It may also be used to maintain weight loss. It works by  decreasing appetite. Changes to diet and exercise are often combined with this medication. ?This medicine may be used for other purposes; ask your health care provider or pharmacist if you have questions. ?COMMON BRAND NAME(S): Wegovy ?What should I tell my care team before I take this medication? ?They need to know if you have any of these conditions: ?Endocrine tumors (MEN 2) or if someone in your family had these tumors ?Eye disease, vision problems ?Gallbladder disease ?History of depression or mental health disease ?History of pancreatitis ?Kidney disease ?Stomach or intestine problems ?Suicidal thoughts, plans, or attempt; a previous suicide attempt by you or a family member ?Thyroid cancer or if someone in your family had thyroid cancer ?An unusual or allergic reaction to semaglutide, other medications, foods, dyes, or preservatives ?Pregnant or trying to get pregnant ?Breast-feeding ?How should I use this medication? ?This medication is injected under the skin. You will be taught how to prepare and give it. Take it as directed on the prescription label. It is given once every week (every 7 days). Keep taking it unless your care team tells you to stop. ?It is important that you put your used needles and pens in a special sharps container. Do not put them in a trash can. If you  do not have a sharps container, call your pharmacist or care team to get one. ?A special MedGuide will be given to you by the pharmacist with each prescription and refill. Be sure to read this information carefully each time. ?This medication comes with INSTRUCTIONS FOR USE. Ask your pharmacist for directions on how to use this medication. Read the information carefully. Talk to your pharmacist or care team if you have questions. ?Talk to your care team about the use of this medication in children. Special care may be needed. ?Overdosage: If you think you have taken too much of this medicine contact a poison control center or emergency  room at once. ?NOTE: This medicine is only for you. Do not share this medicine with others. ?What if I miss a dose? ?If you miss a dose and the next scheduled dose is more than 2 days away, take the missed

## 2021-09-03 NOTE — Progress Notes (Signed)
Chief Complaint  ?Patient presents with  ? Update Medications  ?  Discuss about effexor, as well as possibly going back on phentermine for wt loss.  ? ?F/u  ?1. Obesity wants refill of adipex and disc wegovy pt agreeable  ?2. Mood d/o, insomnia lunesta 1 mg qd insruance will only cover 14 pills monthly effexor xr 75 mg qd is helping she had a lot of life stressors separated from husband 1 year ago but getting back with him and things doing better tried thriveworks then [facility] but her therapist left and declines therapy for now  ?Has tried melatonin in the past for sleep and helped  ?She stopped klonopin  ?3. Snoring wants referral for sleep study ?4. Check urine uti h/o uti ? ?Review of Systems  ?Constitutional:  Negative for weight loss.  ?HENT:  Negative for hearing loss.   ?Eyes:  Negative for blurred vision.  ?Respiratory:  Negative for shortness of breath.   ?Cardiovascular:  Negative for chest pain.  ?Gastrointestinal:  Negative for abdominal pain and blood in stool.  ?Genitourinary:  Negative for dysuria.  ?Musculoskeletal:  Negative for falls and joint pain.  ?Skin:  Negative for rash.  ?Neurological:  Negative for headaches.  ?Psychiatric/Behavioral:  Positive for depression. The patient has insomnia.   ?Past Medical History:  ?Diagnosis Date  ? Anxiety   ? Complete miscarriage   ? COVID-19   ? +fatigue+ 03/2021 better 3-4 days  ? Depression   ? postpartum   ? IBS (irritable bowel syndrome)   ? constipation  ? Kidney stones   ? Melasma   ? lip  ? Psoriasis   ? on Humira Dr. Phillip Heal   ? UTI (urinary tract infection)   ? ?Past Surgical History:  ?Procedure Laterality Date  ? CERVICAL BIOPSY    ? KNEE SURGERY    ? cyst removal  ? UTERINE SEPTUM RESECTION  04/2012  ? Dr. March Rummage at [facility]  ? ?Family History  ?Problem Relation Age of Onset  ? Rashes / Skin problems Mother   ? Other Mother   ?     abnormal mammogram but not cancer  ? Hyperlipidemia Father   ? Rashes / Skin problems Father   ? Rashes / Skin  problems Sister   ? Breast cancer Paternal Aunt   ? ?Social History  ? ?Socioeconomic History  ? Marital status: Married  ?  Spouse name: Not on file  ? Number of children: Not on file  ? Years of education: Not on file  ? Highest education level: Not on file  ?Occupational History  ? Not on file  ?Tobacco Use  ? Smoking status: Never  ? Smokeless tobacco: Never  ?Substance and Sexual Activity  ? Alcohol use: No  ?  Alcohol/week: 0.0 standard drinks  ? Drug use: No  ? Sexual activity: Yes  ?  Partners: Male  ?Other Topics Concern  ? Not on file  ?Social History Narrative  ? 4 th grade teacher   ? Married with kids   ? ?Social Determinants of Health  ? ?Financial Resource Strain: Not on file  ?Food Insecurity: Not on file  ?Transportation Needs: Not on file  ?Physical Activity: Not on file  ?Stress: Not on file  ?Social Connections: Not on file  ?Intimate Partner Violence: Not on file  ? ?Current Meds  ?Medication Sig  ? SKYRIZI, 150 MG DOSE, 75 MG/0.83ML PSKT 2 doses 0 month, 1 month then every 3 months  ?Dr. Kellie Moor  ? [  DISCONTINUED] venlafaxine XR (EFFEXOR-XR) 75 MG 24 hr capsule TAKE 1 CAPSULE(75 MG) BY MOUTH DAILY WITH BREAKFAST  ? ?No Known Allergies ?No results found for this or any previous visit (from the past 2160 hour(s)). ?Objective  ?Body mass index is 32.26 kg/m?. ?Wt Readings from Last 3 Encounters:  ?09/03/21 176 lb 6.4 oz (80 kg)  ?08/21/20 160 lb 4 oz (72.7 kg)  ?08/01/19 166 lb 6.4 oz (75.5 kg)  ? ?Temp Readings from Last 3 Encounters:  ?09/03/21 98.7 ?F (37.1 ?C) (Oral)  ?08/01/19 98 ?F (36.7 ?C) (Temporal)  ?03/20/19 97.8 ?F (36.6 ?C) (Oral)  ? ?BP Readings from Last 3 Encounters:  ?09/03/21 120/88  ?08/21/20 118/80  ?08/01/19 118/80  ? ?Pulse Readings from Last 3 Encounters:  ?09/03/21 94  ?08/21/20 93  ?08/01/19 (!) 113  ? ? ?Physical Exam ?Vitals and nursing note reviewed.  ?Constitutional:   ?   Appearance: Normal appearance. She is well-developed and well-groomed.  ?HENT:  ?   Head:  Normocephalic and atraumatic.  ?Eyes:  ?   Conjunctiva/sclera: Conjunctivae normal.  ?   Pupils: Pupils are equal, round, and reactive to light.  ?Cardiovascular:  ?   Rate and Rhythm: Normal rate and regular rhythm.  ?   Heart sounds: Normal heart sounds. No murmur heard. ?Pulmonary:  ?   Effort: Pulmonary effort is normal.  ?   Breath sounds: Normal breath sounds.  ?Abdominal:  ?   General: Abdomen is flat. Bowel sounds are normal.  ?   Tenderness: There is no abdominal tenderness.  ?Musculoskeletal:     ?   General: No tenderness.  ?Skin: ?   General: Skin is warm and dry.  ?Neurological:  ?   General: No focal deficit present.  ?   Mental Status: She is alert and oriented to person, place, and time. Mental status is at baseline.  ?   Cranial Nerves: Cranial nerves 2-12 are intact.  ?   Motor: Motor function is intact.  ?   Coordination: Coordination is intact.  ?   Gait: Gait is intact.  ?Psychiatric:     ?   Attention and Perception: Attention and perception normal.     ?   Mood and Affect: Mood and affect normal.     ?   Speech: Speech normal.     ?   Behavior: Behavior normal. Behavior is cooperative.     ?   Thought Content: Thought content normal.     ?   Cognition and Memory: Cognition and memory normal.     ?   Judgment: Judgment normal.  ? ? ?Assessment  ?Plan  ?Snoring - Plan: Ambulatory referral to Pulmonology home sleep studay ? ?Anxiety and depression/insomnia- Plan: venlafaxine XR (EFFEXOR-XR) 75 MG 24 hr capsule ?Rec stress relax tranquil sleep otc  ?Insurance would not cover lunesta 1 mg qd  ? ?Obesity (BMI 30-39.9) - Plan: phentermine (ADIPEX-P) 37.5 MG tablet ?Wegovy ? ? ?Acute cystitis without hematuria - Plan: Urine Culture  ? ?HM ?Declines flu shot  ?Tdap given today ?-prevnar had  ?moderna 07/10/20 3rd dose utd  ?Has had 4 total moderna  ?  ?Had MMR and hep B vaccines  ?Varicella disease had in 1991  ?Hep B not immune 2017 check titer to confirm, checked again non reactive and had vaccine  being she is on humira may make her less likely to take/make vaccination at this time less effective will disc with Dr. Phillip Heal  ?Changed to [facility] dermatology on  skyrizi for PSA ?Last f/u 07/2020 nl skin exam  ?  ?Pap 02/12/20 normal h/o lsil will cc [facility] and see when due to come back  ? ?rec healthy diet and exercise  ?Provider: Dr. Olivia Mackie McLean-Scocuzza-Internal Medicine  ?

## 2021-09-03 NOTE — Telephone Encounter (Signed)
Lvm for pt to return call to schedule f/u appt with Dr.Tracy in 3-6 months. ?

## 2021-09-04 LAB — URINALYSIS, ROUTINE W REFLEX MICROSCOPIC
Bacteria, UA: NONE SEEN /HPF
Bilirubin Urine: NEGATIVE
Glucose, UA: NEGATIVE
Hgb urine dipstick: NEGATIVE
Hyaline Cast: NONE SEEN /LPF
Ketones, ur: NEGATIVE
Leukocytes,Ua: NEGATIVE
Nitrite: NEGATIVE
Specific Gravity, Urine: 1.022 (ref 1.001–1.035)
WBC, UA: NONE SEEN /HPF (ref 0–5)
pH: >=8.5 — AB (ref 5.0–8.0)

## 2021-09-04 LAB — URINE CULTURE
MICRO NUMBER:: 13259849
SPECIMEN QUALITY:: ADEQUATE

## 2021-09-07 ENCOUNTER — Telehealth: Payer: Self-pay

## 2021-09-07 NOTE — Telephone Encounter (Signed)
Lvm for pt to return call in regards to labs.  ?

## 2021-09-07 NOTE — Telephone Encounter (Signed)
PA has been started for Wegovy 0.25 MG/0.5 ML  ?Key: [id number] ?Rx #: [id number] ? ?&  ? ?Wegovy 1 MG/0.5 ML ?Key: [id number] ?Rx #: [id number] ? ?Awaiting approval or denial on both.  ?

## 2021-09-08 ENCOUNTER — Telehealth: Payer: Self-pay

## 2021-09-08 NOTE — Telephone Encounter (Signed)
Called pt x2 lvm x2 for pt to return call in regards to labs. °

## 2021-09-09 ENCOUNTER — Telehealth: Payer: Self-pay

## 2021-09-09 ENCOUNTER — Ambulatory Visit: Payer: Self-pay | Admitting: Internal Medicine

## 2021-09-09 NOTE — Telephone Encounter (Signed)
LMTCB for labs. 

## 2021-09-14 ENCOUNTER — Encounter: Payer: Self-pay | Admitting: Internal Medicine

## 2021-09-14 DIAGNOSIS — Z6832 Body mass index (BMI) 32.0-32.9, adult: Secondary | ICD-10-CM | POA: Insufficient documentation

## 2021-10-05 ENCOUNTER — Telehealth: Payer: Self-pay

## 2021-10-05 NOTE — Telephone Encounter (Signed)
Lm for patient to ask if she has had previous sleep study.  

## 2021-10-06 NOTE — Telephone Encounter (Signed)
Lm x2 for patient.  Will close encounter per office protocol.   

## 2021-10-08 ENCOUNTER — Ambulatory Visit (INDEPENDENT_AMBULATORY_CARE_PROVIDER_SITE_OTHER): Payer: BC Managed Care – PPO | Admitting: Adult Health

## 2021-10-08 ENCOUNTER — Encounter: Payer: Self-pay | Admitting: Adult Health

## 2021-10-08 DIAGNOSIS — Z6832 Body mass index (BMI) 32.0-32.9, adult: Secondary | ICD-10-CM

## 2021-10-08 DIAGNOSIS — R400 Somnolence: Secondary | ICD-10-CM | POA: Diagnosis not present

## 2021-10-08 DIAGNOSIS — R0683 Snoring: Secondary | ICD-10-CM | POA: Insufficient documentation

## 2021-10-08 HISTORY — DX: Somnolence: R40.0

## 2021-10-08 HISTORY — DX: Snoring: R06.83

## 2021-10-08 NOTE — Progress Notes (Signed)
Reviewed and agree with assessment/plan.   Rashid Whitenight, MD  Pulmonary/Critical Care 10/08/2021, 10:12 AM Pager:  [phone]  

## 2021-10-08 NOTE — Assessment & Plan Note (Signed)
Healthy weight loss 

## 2021-10-08 NOTE — Patient Instructions (Addendum)
We will set up for home sleep study Healthy sleep regimen Do not drive if sleepy Work on healthy weight loss Follow-up in 6 to 8 weeks to review sleep study results and treatment plan

## 2021-10-08 NOTE — Assessment & Plan Note (Signed)
Daytime sleepiness, restless sleep, insomnia , BMI 31 , snoring all suspicious for underlying sleep apnea.  We will set up for home sleep study.  Patient education given - discussed how weight can impact sleep and risk for sleep disordered breathing - discussed options to assist with weight loss: combination of diet modification, cardiovascular and strength training exercises   - had an extensive discussion regarding the adverse health consequences related to untreated sleep disordered breathing - specifically discussed the risks for hypertension, coronary artery disease, cardiac dysrhythmias, cerebrovascular disease, and diabetes - lifestyle modification discussed   - discussed how sleep disruption can increase risk of accidents, particularly when driving - safe driving practices were discussed   Discuss healthy sleep regimen   Plan  Patient Instructions  We will set up for home sleep study Healthy sleep regimen Do not drive if sleepy Work on healthy weight loss Follow-up in 6 to 8 weeks to review sleep study results and treatment plan

## 2021-10-08 NOTE — Assessment & Plan Note (Signed)
Snoring, daytime sleepiness, restless sleep, BMI 31 all suspicious for underlying sleep apnea. Patient education given on sleep apnea.  - discussed how weight can impact sleep and risk for sleep disordered breathing - discussed options to assist with weight loss: combination of diet modification, cardiovascular and strength training exercises   - had an extensive discussion regarding the adverse health consequences related to untreated sleep disordered breathing - specifically discussed the risks for hypertension, coronary artery disease, cardiac dysrhythmias, cerebrovascular disease, and diabetes - lifestyle modification discussed   - discussed how sleep disruption can increase risk of accidents, particularly when driving - safe driving practices were discussed    Plan  Patient Instructions  We will set up for home sleep study Healthy sleep regimen Do not drive if sleepy Work on healthy weight loss Follow-up in 6 to 8 weeks to review sleep study results and treatment plan

## 2021-10-08 NOTE — Progress Notes (Signed)
'@Patient'  ID: Terri Andrews, female    DOB: [birth year], 41 y.o.   MRN: [id number]  Chief Complaint  Patient presents with   sleep consult    Referring provider: McLean-Scocuzza, Olivia Mackie *  HPI: 41 year old female seen for sleep consult Oct 08, 2021 for snoring, restless sleep, insomnia and daytime sleepiness  TEST/EVENTS :   10/08/2021 Sleep consult  Patient presents for a sleep consult today.  Kindly referred by Dr. Terese Door.  Patient complains of snoring, restless sleep, daytime sleepiness, insomnia.  Patient says she has a hard time staying asleep. Marland Kitchen  Has used Lunesta in past, helped but insurance will not cover. Uses melatonin on occasion. Main issues is can not sleep through the night. Wakes up frequently.  Patient complains she feels tired all the time.  Gasp for air at night waking up.  Has intermittent headaches in the morning time.  Has snoring on and off.  Wakes up tired all the time. Typically goes to bed about 10 PM.  Only takes about 10 minutes to go to sleep.  Is up at least 3 times each night.  Wakes up frequently . Gets up at 6:30 AM.  Weight is up about 20 ,.pounds over the last 2 years.  Current weight is 174 pounds with a BMI at 31.  Patient is never had a previous sleep study.  Caffeine intake 1-2 cups.  Epworth score is 7 out of 24.  Typically gets sleepy in the afternoon hours.  Does get sleepy if she is an active. No symptoms suspicious for sleep paralysis or cataplexy.  No history of stroke or congestive heart failure. No removable dental work. Does wear a retainer each night , previous braces.   Social history patient is married.  Has 2 children.  She is a Pharmacist, hospital.  She is a never smoker.  Social alcohol.  No drug use. Does not exercise.   Surgical history knee surgery, cervix septum removal .   Medical history irritable bowel, psoriasis, anxiety, kidney stones, depression   Family history positive for hyperlipidemia and breast cancer, dm     No Known  Allergies  Immunization History  Administered Date(s) Administered   Influenza-Unspecified 02/14/2013, 03/06/2015   MMR 05/03/2012   Moderna SARS-COV2 Booster Vaccination 07/10/2020   Moderna Sars-Covid-2 Vaccination 08/07/2019, 10/05/2019   Pneumococcal Conjugate-13 03/06/2015   Tdap 08/25/2010, 09/03/2021    Past Medical History:  Diagnosis Date   Anxiety    Complete miscarriage    COVID-19    +fatigue+ 03/2021 better 3-4 days   Depression    postpartum    IBS (irritable bowel syndrome)    constipation   Kidney stones    Melasma    lip   Psoriasis    on Humira Dr. Phillip Heal    UTI (urinary tract infection)     Tobacco History: Social History   Tobacco Use  Smoking Status Never  Smokeless Tobacco Never   Counseling given: Not Answered   Outpatient Medications Prior to Visit  Medication Sig Dispense Refill   Cholecalciferol 1.25 MG (50000 UT) capsule Take 1 capsule (50,000 Units total) by mouth every 30 (thirty) days. Vitamin D3 6 capsule 1   phentermine (ADIPEX-P) 37.5 MG tablet Take 1 tablet (37.5 mg total) by mouth daily before breakfast. 60 tablet 0   SKYRIZI, 150 MG DOSE, 75 MG/0.83ML PSKT 2 doses 0 month, 1 month then every 3 months  Dr. Kellie Moor     venlafaxine XR (EFFEXOR-XR) 75 MG 24 hr capsule TAKE  1 CAPSULE(75 MG) BY MOUTH DAILY WITH BREAKFAST 90 capsule 3   Semaglutide-Weight Management (WEGOVY) 0.25 MG/0.5ML SOAJ Inject 0.25 mg into the skin once a week. Month 1 and titrate up each month to max dose 2 mL 0   Semaglutide-Weight Management (WEGOVY) 0.5 MG/0.5ML SOAJ Inject 0.5 mg into the skin once a week. 2 mL 0   Semaglutide-Weight Management (WEGOVY) 1 MG/0.5ML SOAJ Inject 1 mg into the skin once a week. 2 mL 0   Semaglutide-Weight Management (WEGOVY) 1.7 MG/0.75ML SOAJ Inject 1.7 mg into the skin once a week. 3 mL 0   Semaglutide-Weight Management (WEGOVY) 2.4 MG/0.75ML SOAJ Inject 2.4 mg into the skin once a week. 3 mL 0   No facility-administered  medications prior to visit.     Review of Systems:   Constitutional:   No  weight loss, night sweats,  Fevers, chills, fatigue, or  lassitude.  HEENT:   No headaches,  Difficulty swallowing,  Tooth/dental problems, or  Sore throat,                No sneezing, itching, ear ache, nasal congestion, post nasal drip,   CV:  No chest pain,  Orthopnea, PND, swelling in lower extremities, anasarca, dizziness, palpitations, syncope.   GI  No heartburn, indigestion, abdominal pain, nausea, vomiting, diarrhea, change in bowel habits, loss of appetite, bloody stools.   Resp: No shortness of breath with exertion or at rest.  No excess mucus, no productive cough,  No non-productive cough,  No coughing up of blood.  No change in color of mucus.  No wheezing.  No chest wall deformity  Skin: no rash or lesions.  GU: no dysuria, change in color of urine, no urgency or frequency.  No flank pain, no hematuria   MS:  No joint pain or swelling.  No decreased range of motion.  No back pain.    Physical Exam  BP 124/70 (BP Location: Left Arm, Cuff Size: Normal)   Pulse 80   Temp 97.8 F (36.6 C) (Temporal)   Ht '5\' 2"'  (1.575 m)   Wt 174 lb 6.4 oz (79.1 kg)   SpO2 96%   BMI 31.90 kg/m   GEN: A/Ox3; pleasant , NAD, well nourished    HEENT:  [place]/AT,     NOSE-clear, THROAT-clear, no lesions, no postnasal drip or exudate noted.  Class 2-3 MP airway   NECK:  Supple w/ fair ROM; no JVD; normal carotid impulses w/o bruits; no thyromegaly or nodules palpated; no lymphadenopathy.    RESP  Clear  P & A; w/o, wheezes/ rales/ or rhonchi. no accessory muscle use, no dullness to percussion  CARD:  RRR, no m/r/g, no peripheral edema, pulses intact, no cyanosis or clubbing.  GI:   Soft & nt; nml bowel sounds; no organomegaly or masses detected.   Musco: Warm bil, no deformities or joint swelling noted.   Neuro: alert, no focal deficits noted.    Skin: Warm, no lesions or rashes    Lab  Results:  CBC   BMET  No results found for: BNP  ProBNP No results found for: PROBNP  Imaging: No results found.        View : No data to display.          No results found for: NITRICOXIDE      Assessment & Plan:   Snoring Snoring, daytime sleepiness, restless sleep, BMI 31 all suspicious for underlying sleep apnea. Patient education given on sleep apnea.  -  discussed how weight can impact sleep and risk for sleep disordered breathing - discussed options to assist with weight loss: combination of diet modification, cardiovascular and strength training exercises   - had an extensive discussion regarding the adverse health consequences related to untreated sleep disordered breathing - specifically discussed the risks for hypertension, coronary artery disease, cardiac dysrhythmias, cerebrovascular disease, and diabetes - lifestyle modification discussed   - discussed how sleep disruption can increase risk of accidents, particularly when driving - safe driving practices were discussed    Plan  Patient Instructions  We will set up for home sleep study Healthy sleep regimen Do not drive if sleepy Work on healthy weight loss Follow-up in 6 to 8 weeks to review sleep study results and treatment plan     Daytime sleepiness Daytime sleepiness, restless sleep, insomnia , BMI 31 , snoring all suspicious for underlying sleep apnea.  We will set up for home sleep study.  Patient education given - discussed how weight can impact sleep and risk for sleep disordered breathing - discussed options to assist with weight loss: combination of diet modification, cardiovascular and strength training exercises   - had an extensive discussion regarding the adverse health consequences related to untreated sleep disordered breathing - specifically discussed the risks for hypertension, coronary artery disease, cardiac dysrhythmias, cerebrovascular disease, and diabetes - lifestyle  modification discussed   - discussed how sleep disruption can increase risk of accidents, particularly when driving - safe driving practices were discussed   Discuss healthy sleep regimen   Plan  Patient Instructions  We will set up for home sleep study Healthy sleep regimen Do not drive if sleepy Work on healthy weight loss Follow-up in 6 to 8 weeks to review sleep study results and treatment plan       BMI 32.0-32.9,adult Healthy weight loss.      Rexene Edison, NP 10/08/2021

## 2021-11-12 ENCOUNTER — Ambulatory Visit: Payer: BC Managed Care – PPO

## 2021-11-12 DIAGNOSIS — G4733 Obstructive sleep apnea (adult) (pediatric): Secondary | ICD-10-CM

## 2021-11-12 DIAGNOSIS — R0683 Snoring: Secondary | ICD-10-CM

## 2021-11-13 DIAGNOSIS — G4733 Obstructive sleep apnea (adult) (pediatric): Secondary | ICD-10-CM

## 2021-11-23 ENCOUNTER — Telehealth: Payer: Self-pay | Admitting: Adult Health

## 2021-11-23 NOTE — Telephone Encounter (Signed)
Home sleep study done on November 12, 2021 showed moderate sleep apnea with AHI at 19/hour and SPO2 low at 84%.  Please set up office visit to discuss sleep study results and treatment plan

## 2021-11-23 NOTE — Telephone Encounter (Signed)
-----   Message from Terri Andrews sent at 11/18/2021  4:36 PM EDT ----- Hello this patient's HST report is ready for review

## 2021-11-25 NOTE — Telephone Encounter (Signed)
ATC x1.  LVM to return call.  Needs OV.

## 2021-11-26 NOTE — Telephone Encounter (Signed)
Patient has OV scheduled for 7/18, Results will be reviewed at the time of her visit.  Nothing further needed.

## 2021-12-08 ENCOUNTER — Ambulatory Visit (INDEPENDENT_AMBULATORY_CARE_PROVIDER_SITE_OTHER): Payer: BC Managed Care – PPO | Admitting: Adult Health

## 2021-12-08 ENCOUNTER — Encounter: Payer: Self-pay | Admitting: Adult Health

## 2021-12-08 VITALS — BP 124/74 | HR 96 | Temp 97.8°F | Ht 61.0 in | Wt 173.0 lb

## 2021-12-08 DIAGNOSIS — Z6832 Body mass index (BMI) 32.0-32.9, adult: Secondary | ICD-10-CM | POA: Diagnosis not present

## 2021-12-08 DIAGNOSIS — G4733 Obstructive sleep apnea (adult) (pediatric): Secondary | ICD-10-CM

## 2021-12-08 NOTE — Progress Notes (Signed)
_0  ID: Terri Andrews, female    DOB: Apr 25, 1981, 41 y.o.   MRN: [id number]  Chief Complaint  Patient presents with   Follow-up    Referring provider: McLean-Scocuzza, Olivia Mackie *  HPI: 41 year old female seen for sleep consult Oct 08, 2021 for snoring and daytime sleepiness found to have moderate obstructive sleep apnea  TEST/EVENTS :  Home sleep study done on November 12, 2021 showed moderate sleep apnea with AHI at 19/hour and SPO2 low at 84%.  .12/08/2021 Follow up : OSA  Patient returns for a 60-monthfollow-up.  Patient was seen last visit for sleep consult.  She had daytime sleepiness, snoring, restless sleep, insomnia.  She was set up for home sleep study that was completed on November 12, 2021 that showed moderate obstructive sleep apnea with AHI at 19/hour and SPO2 low at 84%.  We discussed her sleep study results.  Went over treatment options including weight loss, oral appliance and CPAP therapy.  Patient like to proceed with CPAP therapy .    No Known Allergies  Immunization History  Administered Date(s) Administered   Influenza-Unspecified 02/14/2013, 03/06/2015   MMR 05/03/2012   Moderna SARS-COV2 Booster Vaccination 07/10/2020   Moderna Sars-Covid-2 Vaccination 08/07/2019, 10/05/2019   Pneumococcal Conjugate-13 03/06/2015   Tdap 08/25/2010, 09/03/2021    Past Medical History:  Diagnosis Date   Anxiety    Complete miscarriage    COVID-19    +fatigue+ 03/2021 better 3-4 days   Depression    postpartum    IBS (irritable bowel syndrome)    constipation   Kidney stones    Melasma    lip   Psoriasis    on Humira Dr. GPhillip Heal   UTI (urinary tract infection)     Tobacco History: Social History   Tobacco Use  Smoking Status Never  Smokeless Tobacco Never   Counseling given: Not Answered   Outpatient Medications Prior to Visit  Medication Sig Dispense Refill   Cholecalciferol 1.25 MG (50000 UT) capsule Take 1 capsule (50,000 Units total) by mouth every 30  (thirty) days. Vitamin D3 6 capsule 1   SKYRIZI, 150 MG DOSE, 75 MG/0.83ML PSKT 2 doses 0 month, 1 month then every 3 months  Dr. IKellie Moor    venlafaxine XR (EFFEXOR-XR) 75 MG 24 hr capsule TAKE 1 CAPSULE(75 MG) BY MOUTH DAILY WITH BREAKFAST 90 capsule 3   phentermine (ADIPEX-P) 37.5 MG tablet Take 1 tablet (37.5 mg total) by mouth daily before breakfast. 60 tablet 0   No facility-administered medications prior to visit.     Review of Systems:   Constitutional:   No  weight loss, night sweats,  Fevers, chills,  +fatigue, or  lassitude.  HEENT:   No headaches,  Difficulty swallowing,  Tooth/dental problems, or  Sore throat,                No sneezing, itching, ear ache, nasal congestion, post nasal drip,   CV:  No chest pain,  Orthopnea, PND, swelling in lower extremities, anasarca, dizziness, palpitations, syncope.   GI  No heartburn, indigestion, abdominal pain, nausea, vomiting, diarrhea, change in bowel habits, loss of appetite, bloody stools.   Resp: No shortness of breath with exertion or at rest.  No excess mucus, no productive cough,  No non-productive cough,  No coughing up of blood.  No change in color of mucus.  No wheezing.  No chest wall deformity  Skin: no rash or lesions.  GU: no dysuria, change in color of  urine, no urgency or frequency.  No flank pain, no hematuria   MS:  No joint pain or swelling.  No decreased range of motion.  No back pain.    Physical Exam  BP 124/74 (BP Location: Left Arm, Cuff Size: Normal)   Pulse 96   Temp 97.8 F (36.6 C) (Temporal)   Ht _0  (1.549 m)   Wt 173 lb (78.5 kg)   SpO2 99%   BMI 32.69 kg/m   GEN: A/Ox3; pleasant , NAD, well nourished    HEENT:  [place]/AT,  NOSE-clear, THROAT-clear, no lesions, no postnasal drip or exudate noted.  Class 2-3 MP airway   NECK:  Supple w/ fair ROM; no JVD; normal carotid impulses w/o bruits; no thyromegaly or nodules palpated; no lymphadenopathy.    RESP  Clear  P & A; w/o, wheezes/  rales/ or rhonchi. no accessory muscle use, no dullness to percussion  CARD:  RRR, no m/r/g, no peripheral edema, pulses intact, no cyanosis or clubbing.  GI:   Soft & nt; nml bowel sounds; no organomegaly or masses detected.   Musco: Warm bil, no deformities or joint swelling noted.   Neuro: alert, no focal deficits noted.    Skin: Warm, no lesions or rashes    Lab Results:  CBC    Component Value Date/Time   WBC 8.2 09/03/2021 1207   RBC 5.39 (H) 09/03/2021 1207   HGB 14.6 09/03/2021 1207   HGB 14.1 08/15/2019 0826   HCT 44.4 09/03/2021 1207   HCT 42.5 08/15/2019 0826   PLT 383.0 09/03/2021 1207   PLT 363 08/15/2019 0826   MCV 82.4 09/03/2021 1207   MCV 84 08/15/2019 0826   MCV 82 11/08/2012 1410   MCH 27.9 08/15/2019 0826   MCH 27.2 10/05/2013 1623   MCHC 32.8 09/03/2021 1207   RDW 13.7 09/03/2021 1207   RDW 12.6 08/15/2019 0826   RDW 13.8 11/08/2012 1410   LYMPHSABS 1.6 09/03/2021 1207   LYMPHSABS 1.6 08/15/2019 0826   LYMPHSABS 1.6 11/08/2012 1410   MONOABS 0.5 09/03/2021 1207   MONOABS 0.6 11/08/2012 1410   EOSABS 0.4 09/03/2021 1207   EOSABS 0.5 (H) 08/15/2019 0826   EOSABS 0.1 11/08/2012 1410   BASOSABS 0.0 09/03/2021 1207   BASOSABS 0.0 08/15/2019 0826   BASOSABS 0.1 11/08/2012 1410    BMET    Component Value Date/Time   NA 135 09/03/2021 1207   NA 141 08/15/2019 0826   K 4.0 09/03/2021 1207   CL 104 09/03/2021 1207   CO2 25 09/03/2021 1207   GLUCOSE 98 09/03/2021 1207   BUN 10 09/03/2021 1207   BUN 9 08/15/2019 0826   CREATININE 0.67 09/03/2021 1207   CREATININE 0.68 10/05/2013 1623   CALCIUM 9.3 09/03/2021 1207   GFRNONAA 113 08/15/2019 0826   GFRAA 131 08/15/2019 0826    BNP No results found for: "BNP"  ProBNP No results found for: "PROBNP"  Imaging: No results found.        No data to display          No results found for: "NITRICOXIDE"      Assessment & Plan:   OSA (obstructive sleep apnea) Moderate OSA -  begin CPAP auto set 5-15 cm H2O  - discussed how weight can impact sleep and risk for sleep disordered breathing - discussed options to assist with weight loss: combination of diet modification, cardiovascular and strength training exercises   - had an extensive discussion regarding the adverse health consequences  related to untreated sleep disordered breathing - specifically discussed the risks for hypertension, coronary artery disease, cardiac dysrhythmias, cerebrovascular disease, and diabetes - lifestyle modification discussed   - discussed how sleep disruption can increase risk of accidents, particularly when driving - safe driving practices were discussed  May try the dream wear nasal    Plan  Patient Instructions  Begin CPAP At bedtime  , wear all night long , at least 6hr each night  Healthy sleep regimen Do not drive if sleepy Work on healthy weight loss Follow-up in 3 months and As needed         BMI 32.0-32.9,adult Healthy weight loss.      Rexene Edison, NP 12/08/2021

## 2021-12-08 NOTE — Assessment & Plan Note (Signed)
Moderate OSA - begin CPAP auto set 5-15 cm H2O  - discussed how weight can impact sleep and risk for sleep disordered breathing - discussed options to assist with weight loss: combination of diet modification, cardiovascular and strength training exercises   - had an extensive discussion regarding the adverse health consequences related to untreated sleep disordered breathing - specifically discussed the risks for hypertension, coronary artery disease, cardiac dysrhythmias, cerebrovascular disease, and diabetes - lifestyle modification discussed   - discussed how sleep disruption can increase risk of accidents, particularly when driving - safe driving practices were discussed  May try the dream wear nasal    Plan  Patient Instructions  Begin CPAP At bedtime  , wear all night long , at least 6hr each night  Healthy sleep regimen Do not drive if sleepy Work on healthy weight loss Follow-up in 3 months and As needed

## 2021-12-08 NOTE — Progress Notes (Signed)
Reviewed and agree with assessment/plan.   Coralyn Helling, MD [facility] Pulmonary/Critical Care 12/08/2021, 10:39 AM Pager:  [phone]

## 2021-12-08 NOTE — Assessment & Plan Note (Signed)
Healthy weight loss 

## 2021-12-08 NOTE — Patient Instructions (Signed)
Begin CPAP At bedtime  , wear all night long , at least 6hr each night  Healthy sleep regimen Do not drive if sleepy Work on healthy weight loss Follow-up in 3 months and As needed

## 2021-12-11 ENCOUNTER — Encounter: Payer: Self-pay | Admitting: Internal Medicine

## 2021-12-11 ENCOUNTER — Ambulatory Visit: Payer: BC Managed Care – PPO | Admitting: Internal Medicine

## 2021-12-11 ENCOUNTER — Other Ambulatory Visit (HOSPITAL_COMMUNITY)
Admission: RE | Admit: 2021-12-11 | Discharge: 2021-12-11 | Disposition: A | Discharge status: Home / Self Care | Payer: BC Managed Care – PPO | Source: Ambulatory Visit | Attending: Internal Medicine | Admitting: Internal Medicine

## 2021-12-11 ENCOUNTER — Telehealth: Payer: Self-pay | Admitting: Internal Medicine

## 2021-12-11 VITALS — BP 120/76 | HR 94 | Temp 98.3°F | Ht 61.0 in | Wt 174.0 lb

## 2021-12-11 DIAGNOSIS — G4733 Obstructive sleep apnea (adult) (pediatric): Secondary | ICD-10-CM

## 2021-12-11 DIAGNOSIS — E785 Hyperlipidemia, unspecified: Secondary | ICD-10-CM | POA: Insufficient documentation

## 2021-12-11 DIAGNOSIS — Z113 Encounter for screening for infections with a predominantly sexual mode of transmission: Secondary | ICD-10-CM | POA: Diagnosis present

## 2021-12-11 DIAGNOSIS — Z0184 Encounter for antibody response examination: Secondary | ICD-10-CM

## 2021-12-11 DIAGNOSIS — Z0000 Encounter for general adult medical examination without abnormal findings: Secondary | ICD-10-CM

## 2021-12-11 DIAGNOSIS — Z111 Encounter for screening for respiratory tuberculosis: Secondary | ICD-10-CM | POA: Diagnosis not present

## 2021-12-11 DIAGNOSIS — R87612 Low grade squamous intraepithelial lesion on cytologic smear of cervix (LGSIL): Secondary | ICD-10-CM

## 2021-12-11 DIAGNOSIS — Z13818 Encounter for screening for other digestive system disorders: Secondary | ICD-10-CM

## 2021-12-11 DIAGNOSIS — Z6832 Body mass index (BMI) 32.0-32.9, adult: Secondary | ICD-10-CM

## 2021-12-11 MED ORDER — SAXENDA 18 MG/3ML ~~LOC~~ SOPN: 0.6000 mg | PEN_INJECTOR | Freq: Every day | SUBCUTANEOUS | 11 refills | Status: DC

## 2021-12-11 MED ORDER — INSULIN PEN NEEDLE 30G X 8 MM MISC: 1.0000 | 3 refills | Status: DC | PRN

## 2021-12-11 NOTE — Telephone Encounter (Signed)
Error

## 2021-12-11 NOTE — Patient Instructions (Addendum)
Hep B Surface Ab, Qual Non Reactive   Comment:               Non Reactive: Inconsistent with immunity,                              less than 10 mIU/mL                Reactive:     Consistent with immunity,                              greater than 9.9 mIU/mL   D3 4000 IU daily otc (capsule or drops with K2)    Liraglutide Injection (Weight Management) What is this medication? LIRAGLUTIDE (LIR a GLOO tide) promotes weight loss. It may also be used to maintain weight loss. It works by decreasing appetite. Changes to diet and exercise are often combined with this medication. This medicine may be used for other purposes; ask your health care provider or pharmacist if you have questions. COMMON BRAND NAME(S): Saxenda What should I tell my care team before I take this medication? They need to know if you have any of these conditions: Endocrine tumors (MEN 2) or if someone in your family had these tumors Gallbladder disease High cholesterol History of alcohol abuse problem History of pancreatitis Kidney disease or if you are on dialysis Liver disease Previous swelling of the tongue, face, or lips with difficulty breathing, difficulty swallowing, hoarseness, or tightening of the throat Stomach problems Suicidal thoughts, plans, or attempt; a previous suicide attempt by you or a family member Thyroid cancer or if someone in your family had thyroid cancer An unusual or allergic reaction to liraglutide, other medications, foods, dyes, or preservatives Pregnant or trying to get pregnant Breast-feeding How should I use this medication? This medication is for injection under the skin of your upper leg, stomach area, or upper arm. You will be taught how to prepare and give this medication. Use exactly as directed. Take your medication at regular intervals. Do not take it more often than directed. This medication comes with INSTRUCTIONS FOR USE. Ask your pharmacist for directions on how to use this  medication. Read the information carefully. Talk to your pharmacist or care team if you have questions. It is important that you put your used needles and syringes in a special sharps container. Do not put them in a trash can. If you do not have a sharps container, call your pharmacist or care team to get one. A special MedGuide will be given to you by the pharmacist with each prescription and refill. Be sure to read this information carefully each time. Talk to your care team about the use of this medication in children. While it may be prescribed for children as young as 65 years of age for selected conditions, precautions do apply. Overdosage: If you think you have taken too much of this medicine contact a poison control center or emergency room at once. NOTE: This medicine is only for you. Do not share this medicine with others. What if I miss a dose? If you miss a dose, take it as soon as you can. If it is almost time for your next dose, take only that dose. Do not take double or extra doses. If you miss your dose for 3 days or more, call your care team to talk about how to  restart this medicine. What may interact with this medication? Insulin and other medications for diabetes This list may not describe all possible interactions. Give your health care provider a list of all the medicines, herbs, non-prescription drugs, or dietary supplements you use. Also tell them if you smoke, drink alcohol, or use illegal drugs. Some items may interact with your medicine. What should I watch for while using this medication? Visit your care team for regular checks on your progress. Drink plenty of fluids while taking this medication. Check with your care team if you get an attack of severe diarrhea, nausea, and vomiting. The loss of too much body fluid can make it dangerous for you to take this medication. This medication may affect blood sugar levels. Ask your care team if changes in diet or medications are  needed if you have diabetes. Patients and their families should watch out for worsening depression or thoughts of suicide. Also watch out for sudden changes in feelings such as feeling anxious, agitated, panicky, irritable, hostile, aggressive, impulsive, severely restless, overly excited and hyperactive, or not being able to sleep. If this happens, especially at the beginning of treatment or after a change in dose, call your care team. Women should inform their care team if they wish to become pregnant or think they might be pregnant. Losing weight while pregnant is not advised and may cause harm to the unborn child. Talk to your care team for more information. What side effects may I notice from receiving this medication? Side effects that you should report to your care team as soon as possible: Allergic reactions or angioedema--skin rash, itching, hives, swelling of the face, eyes, lips, tongue, arms, or legs, trouble swallowing or breathing Fast or irregular heartbeat Gallbladder problems--severe stomach pain, nausea, vomiting, fever Kidney injury--decrease in the amount of urine, swelling of the ankles, hands, or feet Pancreatitis--severe stomach pain that spreads to your back or gets worse after eating or when touched, fever, nausea, vomiting Thoughts of suicide or self-harm, worsening mood, feelings of depression Thyroid cancer--new mass or lump in the neck, pain or trouble swallowing, trouble breathing, hoarseness Side effects that usually do not require medical attention (report to your care team if they continue or are bothersome): Constipation Dizziness Fatigue Headache Loss of Appetite Nausea Upset stomach This list may not describe all possible side effects. Call your doctor for medical advice about side effects. You may report side effects to FDA at 1-800-FDA-1088. Where should I keep my medication? Keep out of the reach of children and pets. Store unopened pen in a refrigerator  between 2 and 8 degrees C (36 and 46 degrees F). Do not freeze or use if the medication has been frozen. Protect from light and excessive heat. After you first use the pen, it can be stored at room temperature between 15 and 30 degrees C (59 and 86 degrees F) or in a refrigerator. Throw away your used pen after 30 days or after the expiration date, whichever comes first. Do not store your pen with the needle attached. If the needle is left on, medication may leak from the pen. NOTE: This sheet is a summary. It may not cover all possible information. If you have questions about this medicine, talk to your doctor, pharmacist, or health care provider.  2023 Elsevier/Gold Standard (2020-06-13 00:00:00)

## 2021-12-11 NOTE — Progress Notes (Signed)
Chief Complaint  Patient presents with   Results    Discuss sleep study results and forms for work    Annual Exam   Annual 1. 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 Moderate osa, hld and bmi 32  3. Wants std screening    Review of Systems  Constitutional:  Negative for weight loss.  HENT:  Negative for hearing loss.   Eyes:  Negative for blurred vision.  Respiratory:  Negative for shortness of breath.   Cardiovascular:  Negative for chest pain.  Gastrointestinal:  Negative for abdominal pain and blood in stool.  Genitourinary:  Negative for dysuria.  Musculoskeletal:  Negative for falls and joint pain.  Skin:  Negative for rash.  Neurological:  Negative for headaches.  Psychiatric/Behavioral:  Negative for depression.    Past Medical History:  Diagnosis Date   Anxiety    Complete miscarriage    COVID-19    +fatigue+ 03/2021 better 3-4 days   Depression    postpartum    IBS (irritable bowel syndrome)    constipation   Kidney stones    Melasma    lip   Psoriasis    on Humira Dr. Phillip Heal    UTI (urinary tract infection)    Past Surgical History:  Procedure Laterality Date   CERVICAL BIOPSY     KNEE SURGERY     cyst removal   UTERINE SEPTUM RESECTION  04/2012   Dr. March Rummage at [facility] History  Problem Relation Age of Onset   Rashes / Skin problems Mother    Other Mother        abnormal mammogram but not cancer   Hyperlipidemia Father    Rashes / Skin problems Father    Rashes / Skin problems Sister    Breast cancer Paternal Aunt    Social History   Socioeconomic History   Marital status: Married    Spouse name: Not on file   Number of children: Not on file   Years of education: Not on file   Highest education level: Not on file  Occupational History   Not on file  Tobacco Use   Smoking status: Never   Smokeless tobacco: Never  Substance and Sexual Activity   Alcohol use: No    Alcohol/week: 0.0 standard drinks of alcohol   Drug  use: No   Sexual activity: Yes    Partners: Male  Other Topics Concern   Not on file  Social History Narrative   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    Social Determinants of Health   Financial Resource Strain: Not on file  Food Insecurity: Not on file  Transportation Needs: Not on file  Physical Activity: Not on file  Stress: Not on file  Social Connections: Not on file  Intimate Partner Violence: Not on file   Current Meds  Medication Sig   Cholecalciferol 1.25 MG (50000 UT) capsule Take 1 capsule (50,000 Units total) by mouth every 30 (thirty) days. Vitamin D3   Insulin Pen Needle (NOVOFINE) 30G X 8 MM MISC Inject 10 each into the skin as needed.   Liraglutide -Weight Management (SAXENDA) 18 MG/3ML SOPN Inject 0.6 mg into the skin daily. Qd x 1 week, 1.2 qd x 1 week, 1.8 qd x 1 week, 2.4 qd x 1 week, 3.0 qd x 1 week   SKYRIZI, 150 MG DOSE, 75 MG/0.83ML PSKT 2 doses 0 month, 1 month then every 3 months  Dr. Kellie Moor   venlafaxine XR (EFFEXOR-XR) 75 MG 24 hr capsule TAKE 1 CAPSULE(75 MG) BY MOUTH DAILY WITH BREAKFAST   No Known Allergies No results found for this or any previous visit (from the past 2160 hour(s)). Objective  Body mass index is 32.88 kg/m. Wt Readings from Last 3 Encounters:  12/11/21 174 lb (78.9 kg)  12/08/21 173 lb (78.5 kg)  10/08/21 174 lb 6.4 oz (79.1 kg)   Temp Readings from Last 3 Encounters:  12/11/21 98.3 F (36.8 C) (Oral)  12/08/21 97.8 F (36.6 C) (Temporal)  10/08/21 97.8 F (36.6 C) (Temporal)   BP Readings from Last 3 Encounters:  12/11/21 120/76  12/08/21 124/74  10/08/21 124/70   Pulse Readings from Last 3 Encounters:  12/11/21 94  12/08/21 96  10/08/21 80    Physical Exam Vitals and nursing note reviewed.  Constitutional:      Appearance: Normal appearance. She is well-developed and well-groomed.  HENT:     Head: Normocephalic and atraumatic.  Eyes:     Conjunctiva/sclera: Conjunctivae normal.     Pupils: Pupils  are equal, round, and reactive to light.  Cardiovascular:     Rate and Rhythm: Normal rate and regular rhythm.     Heart sounds: Normal heart sounds. No murmur heard. Pulmonary:     Effort: Pulmonary effort is normal.     Breath sounds: Normal breath sounds.  Abdominal:     General: Abdomen is flat. Bowel sounds are normal.     Tenderness: There is no abdominal tenderness.  Musculoskeletal:        General: No tenderness.  Skin:    General: Skin is warm and dry.  Neurological:     General: No focal deficit present.     Mental Status: She is alert and oriented to person, place, and time. Mental status is at baseline.     Cranial Nerves: Cranial nerves 2-12 are intact.     Motor: Motor function is intact.     Coordination: Coordination is intact.     Gait: Gait is intact.  Psychiatric:        Attention and Perception: Attention and perception normal.        Mood and Affect: Mood and affect normal.        Speech: Speech normal.        Behavior: Behavior normal. Behavior is cooperative.        Thought Content: Thought content normal.        Cognition and Memory: Cognition and memory normal.        Judgment: Judgment normal.     Assessment  Plan  Annual see below   OSA (obstructive sleep apnea)/HLD, bmi 32 - Plan: Liraglutide -Weight Management (SAXENDA) 18 MG/3ML SOPN, Insulin Pen Needle (NOVOFINE) 30G X 8 MM MISC Tried adipex and didn't work and healthy diet and exercise  Std and communicable diseases screening Immunity status testing - Plan: Measles/Mumps/Rubella Immunity, QuantiFERON-TB Gold Plus  Encounter for screening for other digestive system disorders - Plan: Hepatitis C antibody Screening for venereal disease - Plan: HIV antibody (with reflex), Hepatitis B Surface AntiGEN, Hepatitis B Core Antibody, total, RPR, HSV(herpes simplex vrs) 1+2 ab-IgG, Urine cytology ancillary only([place])   HM Declines flu shot  Tdap 09/03/21 -prevnar had  moderna 07/10/20 3rd dose  utd  Has had 4 total moderna    Had MMR and hep B vaccines not immune 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 Phillip Heal  Changed to [facility] dermatology on skyrizi for PSA Last f/u 07/2020 nl skin exam    Pap 02/12/20 normal h/o lsil will cc [facility] and see when due to come back    Mammogram age 37 y.o   rec healthy diet and exercise  Provider: Dr. Olivia Mackie McLean-Scocuzza-Internal Medicine

## 2021-12-15 ENCOUNTER — Encounter: Payer: Self-pay | Admitting: Internal Medicine

## 2021-12-15 LAB — QUANTIFERON-TB GOLD PLUS
Mitogen-NIL: >10 IU/mL
NIL: 0.04 IU/mL
QuantiFERON-TB Gold Plus: NEGATIVE
TB1-NIL: 0.01 IU/mL
TB2-NIL: 0 IU/mL

## 2021-12-15 LAB — HEPATITIS C ANTIBODY: Hepatitis C Ab: NONREACTIVE

## 2021-12-15 LAB — MEASLES/MUMPS/RUBELLA IMMUNITY
Mumps IgG: <9 AU/mL — ABNORMAL LOW
Rubella: 1.6 Index
Rubeola IgG: 222 AU/mL

## 2021-12-15 LAB — HSV(HERPES SIMPLEX VRS) I + II AB-IGG
HAV 1 IGG,TYPE SPECIFIC AB: 2.26 index — ABNORMAL HIGH
HSV 2 IGG,TYPE SPECIFIC AB: <0.9 index

## 2021-12-15 LAB — HIV ANTIBODY (ROUTINE TESTING W REFLEX): HIV 1&2 Ab, 4th Generation: NONREACTIVE

## 2021-12-15 LAB — HEPATITIS B CORE ANTIBODY, TOTAL: Hep B Core Total Ab: NONREACTIVE

## 2021-12-15 LAB — HEPATITIS B SURFACE ANTIBODY, QUANTITATIVE: Hep B S AB Quant (Post): <5 m[IU]/mL — ABNORMAL LOW (ref >=10)

## 2021-12-15 LAB — RPR: RPR Ser Ql: NONREACTIVE

## 2021-12-15 LAB — HEPATITIS B SURFACE ANTIGEN: Hepatitis B Surface Ag: NONREACTIVE

## 2021-12-16 ENCOUNTER — Encounter: Payer: Self-pay | Admitting: Internal Medicine

## 2021-12-16 ENCOUNTER — Telehealth: Payer: Self-pay

## 2021-12-16 LAB — URINE CYTOLOGY ANCILLARY ONLY
Bacterial Vaginitis-Urine: NEGATIVE
Candida Urine: NEGATIVE
Chlamydia: NEGATIVE
Comment: NEGATIVE
Comment: NEGATIVE
Comment: NORMAL
Neisseria Gonorrhea: NEGATIVE
Trichomonas: NEGATIVE

## 2021-12-16 NOTE — Telephone Encounter (Signed)
Initiated PA via covermymeds on 12/16/21 for pt's Saxenda 18MG /3ML pen-injectors  Rx [id number]  Awaiting denial or approval

## 2022-01-24 ENCOUNTER — Encounter: Payer: Self-pay | Admitting: Internal Medicine

## 2022-01-31 MED ORDER — WEGOVY 0.5 MG/0.5ML ~~LOC~~ SOAJ: 0.5000 mg | SUBCUTANEOUS | 0 refills | Status: DC

## 2022-01-31 MED ORDER — WEGOVY 2.4 MG/0.75ML ~~LOC~~ SOAJ: 2.4000 mg | SUBCUTANEOUS | 11 refills | Status: DC

## 2022-01-31 MED ORDER — WEGOVY 1 MG/0.5ML ~~LOC~~ SOAJ: 1.0000 mg | SUBCUTANEOUS | 0 refills | Status: DC

## 2022-01-31 MED ORDER — WEGOVY 1.7 MG/0.75ML ~~LOC~~ SOAJ: 1.7000 mg | SUBCUTANEOUS | 0 refills | Status: DC

## 2022-01-31 NOTE — Addendum Note (Signed)
Addended by: Quentin Ore on: 01/31/2022 11:22 PM   Modules accepted: Orders

## 2022-01-31 NOTE — Telephone Encounter (Signed)
Sent wegovy to her pharmacy  Pt will likely need another PA

## 2022-02-16 ENCOUNTER — Telehealth: Payer: Self-pay

## 2022-02-16 NOTE — Telephone Encounter (Signed)
Called pt but mailbox is full was unable to leave a msg.   I called pt in regards to: her health examination form. I needed to know if she wanted it mailed or if she would like to pick up the form.   I also had to tell her that Dr. Olivia Mackie recommends she get the Hep B vax  2 doses 1 month apart and the 2 dose  MMR vaccine as well.

## 2022-02-22 ENCOUNTER — Telehealth: Payer: Self-pay

## 2022-02-22 NOTE — Telephone Encounter (Signed)
After my second attempt to contact pt in regards to her health examination form with Dr. Audrie Gallus recommendations I decided to create a letter and mail her copy to her also with the information from Dr. Olivia Mackie    Recommended she get Hep B vaccine 2 doses 1 month apart and MMR vaccine.

## 2022-03-19 ENCOUNTER — Telehealth: Payer: Self-pay

## 2022-03-19 NOTE — Telephone Encounter (Signed)
Patient is scheduled for cpap follow up 03/22/2022.  Spoke to [place] with [facility]. Patient has not been setup on machine, due to multiple attempts to contact patient with no call back.  ATC patient-unable to leave vm due to mailbox not being setup.

## 2022-03-22 ENCOUNTER — Ambulatory Visit: Payer: BC Managed Care – PPO | Admitting: Adult Health

## 2022-03-22 NOTE — Telephone Encounter (Signed)
Spoke to patient.  She stated that she would contact [employer] for cpap machine. She will call back to scheduled appt once setup on machine.  03/22/2022 appt canceled per patient request.  Nothing further needed.

## 2022-06-14 ENCOUNTER — Telehealth: Payer: BC Managed Care – PPO | Admitting: Nurse Practitioner

## 2022-06-14 DIAGNOSIS — N3000 Acute cystitis without hematuria: Secondary | ICD-10-CM

## 2022-06-14 MED ORDER — NITROFURANTOIN MONOHYD MACRO 100 MG PO CAPS: 100.0000 mg | ORAL_CAPSULE | Freq: Two times a day (BID) | ORAL | 0 refills | Status: AC

## 2022-06-14 NOTE — Progress Notes (Signed)

## 2022-07-05 ENCOUNTER — Telehealth: Payer: BC Managed Care – PPO | Admitting: Family

## 2022-07-05 DIAGNOSIS — R399 Unspecified symptoms and signs involving the genitourinary system: Secondary | ICD-10-CM | POA: Diagnosis not present

## 2022-07-05 MED ORDER — CEPHALEXIN 500 MG PO CAPS: 500.0000 mg | ORAL_CAPSULE | Freq: Two times a day (BID) | ORAL | 0 refills | Status: DC

## 2022-07-05 NOTE — Patient Instructions (Signed)
Urinary Tract Infection, Adult  A urinary tract infection (UTI) is an infection of any part of the urinary tract. The urinary tract includes the kidneys, ureters, bladder, and urethra. These organs make, store, and get rid of urine in the body. An upper UTI affects the ureters and kidneys. A lower UTI affects the bladder and urethra. What are the causes? Most urinary tract infections are caused by bacteria in your genital area around your urethra, where urine leaves your body. These bacteria grow and cause inflammation of your urinary tract. What increases the risk? You are more likely to develop this condition if: You have a urinary catheter that stays in place. You are not able to control when you urinate or have a bowel movement (incontinence). You are female and you: Use a spermicide or diaphragm for birth control. Have low estrogen levels. Are pregnant. You have certain genes that increase your risk. You are sexually active. You take antibiotic medicines. You have a condition that causes your flow of urine to slow down, such as: An enlarged prostate, if you are female. Blockage in your urethra. A kidney stone. A nerve condition that affects your bladder control (neurogenic bladder). Not getting enough to drink, or not urinating often. You have certain medical conditions, such as: Diabetes. A weak disease-fighting system (immunesystem). Sickle cell disease. Gout. Spinal cord injury. What are the signs or symptoms? Symptoms of this condition include: Needing to urinate right away (urgency). Frequent urination. This may include small amounts of urine each time you urinate. Pain or burning with urination. Blood in the urine. Urine that smells bad or unusual. Trouble urinating. Cloudy urine. Vaginal discharge, if you are female. Pain in the abdomen or the lower back. You may also have: Vomiting or a decreased appetite. Confusion. Irritability or tiredness. A fever or  chills. Diarrhea. The first symptom in older adults may be confusion. In some cases, they may not have any symptoms until the infection has worsened. How is this diagnosed? This condition is diagnosed based on your medical history and a physical exam. You may also have other tests, including: Urine tests. Blood tests. Tests for STIs (sexually transmitted infections). If you have had more than one UTI, a cystoscopy or imaging studies may be done to determine the cause of the infections. How is this treated? Treatment for this condition includes: Antibiotic medicine. Over-the-counter medicines to treat discomfort. Drinking enough water to stay hydrated. If you have frequent infections or have other conditions such as a kidney stone, you may need to see a health care provider who specializes in the urinary tract (urologist). In rare cases, urinary tract infections can cause sepsis. Sepsis is a life-threatening condition that occurs when the body responds to an infection. Sepsis is treated in the hospital with IV antibiotics, fluids, and other medicines. Follow these instructions at home:  Medicines Take over-the-counter and prescription medicines only as told by your health care provider. If you were prescribed an antibiotic medicine, take it as told by your health care provider. Do not stop using the antibiotic even if you start to feel better. General instructions Make sure you: Empty your bladder often and completely. Do not hold urine for long periods of time. Empty your bladder after sex. Wipe from front to back after urinating or having a bowel movement if you are female. Use each tissue only one time when you wipe. Drink enough fluid to keep your urine pale yellow. Keep all follow-up visits. This is important. Contact a health   care provider if: Your symptoms do not get better after 1-2 days. Your symptoms go away and then return. Get help right away if: You have severe pain in  your back or your lower abdomen. You have a fever or chills. You have nausea or vomiting. Summary A urinary tract infection (UTI) is an infection of any part of the urinary tract, which includes the kidneys, ureters, bladder, and urethra. Most urinary tract infections are caused by bacteria in your genital area. Treatment for this condition often includes antibiotic medicines. If you were prescribed an antibiotic medicine, take it as told by your health care provider. Do not stop using the antibiotic even if you start to feel better. Keep all follow-up visits. This is important. This information is not intended to replace advice given to you by your health care provider. Make sure you discuss any questions you have with your health care provider. Document Revised: 12/21/2019 Document Reviewed: 12/21/2019 Elsevier Patient Education  2023 Elsevier Inc.  

## 2022-07-05 NOTE — Progress Notes (Signed)
Virtual Visit Consent   Terri Andrews, you are scheduled for a virtual visit with a [facility] provider today. Just as with appointments in the office, your consent must be obtained to participate. Your consent will be active for this visit and any virtual visit you may have with one of our providers in the next 365 days. If you have a MyChart account, a copy of this consent can be sent to you electronically.  As this is a virtual visit, video technology does not allow for your provider to perform a traditional examination. This may limit your provider's ability to fully assess your condition. If your provider identifies any concerns that need to be evaluated in person or the need to arrange testing (such as labs, EKG, etc.), we will make arrangements to do so. Although advances in technology are sophisticated, we cannot ensure that it will always work on either your end or our end. If the connection with a video visit is poor, the visit may have to be switched to a telephone visit. With either a video or telephone visit, we are not always able to ensure that we have a secure connection.  By engaging in this virtual visit, you consent to the provision of healthcare and authorize for your insurance to be billed (if applicable) for the services provided during this visit. Depending on your insurance coverage, you may receive a charge related to this service.  I need to obtain your verbal consent now. Are you willing to proceed with your visit today? Terri Andrews has provided verbal consent on 07/05/2022 for a virtual visit (video or telephone). Terri Dun, FNP  Date: 07/05/2022 5:08 PM  Virtual Visit via Video Note   I, Terri Andrews, connected with  Terri Andrews  ([id number], September 16, 1980) on 07/05/22 at  5:15 PM EST by a video-enabled telemedicine application and verified that I am speaking with the correct person using two identifiers.  Location: Patient: Virtual Visit Location Patient:  Home Provider: Virtual Visit Location Provider: Office/Clinic   I discussed the limitations of evaluation and management by telemedicine and the availability of in person appointments. The patient expressed understanding and agreed to proceed.    History of Present Illness: Terri Andrews is a 42 y.o. who identifies as a female who was assigned female at birth, and is being seen today for UTI symptoms.  HPI: Dysuria  This is a recurrent problem. The current episode started 1 to 4 weeks ago. The problem occurs intermittently. The problem has been waxing and waning. The quality of the pain is described as burning. The pain is at a severity of 8/10. The pain is mild. There has been no fever. Associated symptoms include frequency, hematuria, hesitancy and urgency. Pertinent negatives include no nausea or vomiting. She has tried antibiotics and increased fluids for the symptoms. The treatment provided mild relief.    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   Benign thyroid cyst 01/16/2018   IBS (irritable bowel syndrome) 12/06/2017   Melasma 12/06/2017   Vitamin D deficiency 10/25/2017   History of postpartum depression 10/25/2017   Thyroid nodule 10/25/2017   Vitamin B 12 deficiency 08/04/2017   Annual physical exam 12/15/2016  Obesity (BMI 30-39.9) 04/23/2011   Psoriasis 03/25/2011   Anxiety and depression 03/25/2011    Allergies: No Known Allergies Medications:  Current Outpatient Medications:    cephALEXin (KEFLEX) 500 MG capsule, Take 1 capsule (500 mg total) by mouth 2 (two) times daily., Disp: 14 capsule, Rfl: 0   Cholecalciferol 1.25 MG (50000 UT) capsule, Take 1 capsule (50,000 Units total) by mouth every 30  (thirty) days. Vitamin D3, Disp: 6 capsule, Rfl: 1   Insulin Pen Needle (NOVOFINE) 30G X 8 MM MISC, Inject 10 each into the skin as needed., Disp: 90 each, Rfl: 3   Liraglutide -Weight Management (SAXENDA) 18 MG/3ML SOPN, Inject 0.6 mg into the skin daily. Qd x 1 week, 1.2 qd x 1 week, 1.8 qd x 1 week, 2.4 qd x 1 week, 3.0 qd x 1 week, Disp: 9 mL, Rfl: 11   Semaglutide-Weight Management (WEGOVY) 0.5 MG/0.5ML SOAJ, Inject 0.5 mg into the skin once a week. X 4month Disp: 2 mL, Rfl: 0   Semaglutide-Weight Management (WEGOVY) 1 MG/0.5ML SOAJ, Inject 1 mg into the skin once a week. X 1 month, Disp: 2 mL, Rfl: 0   Semaglutide-Weight Management (WEGOVY) 1.7 MG/0.75ML SOAJ, Inject 1.7 mg into the skin once a week. X 1 month, Disp: 3 mL, Rfl: 0   Semaglutide-Weight Management (WEGOVY) 2.4 MG/0.75ML SOAJ, Inject 2.4 mg into the skin once a week. As tolerated, Disp: 3 mL, Rfl: 11   SKYRIZI, 150 MG DOSE, 75 MG/0.83ML PSKT, 2 doses 0 month, 1 month then every 3 months  Dr. IKellie Moor Disp: , Rfl:    venlafaxine XR (EFFEXOR-XR) 75 MG 24 hr capsule, TAKE 1 CAPSULE(75 MG) BY MOUTH DAILY WITH BREAKFAST, Disp: 90 capsule, Rfl: 3  Observations/Objective: Patient is well-developed, well-nourished in no acute distress.  Resting comfortably  at home.  Head is normocephalic, atraumatic.  No labored breathing.  Speech is clear and coherent with logical content.  Patient is alert and oriented at baseline.    Assessment and Plan: 1. UTI symptoms - cephALEXin (KEFLEX) 500 MG capsule; Take 1 capsule (500 mg total) by mouth 2 (two) times daily.  Dispense: 14 capsule; Refill: 0  PT has a follow up with PCP next week, will get urine rechecked at that time.  Start Keflex Force fluids  AZO as needed Red flags discussed, and follow up with PCP  Follow Up Instructions: I discussed the assessment and treatment plan with the patient. The patient was provided an opportunity to ask questions and all were answered. The  patient agreed with the plan and demonstrated an understanding of the instructions.  A copy of instructions were sent to the patient via MyChart unless otherwise noted below.     The patient was advised to call back or seek an in-person evaluation if the symptoms worsen or if the condition fails to improve as anticipated.  Time:  I spent 8 minutes with the patient via telehealth technology discussing the above problems/concerns.    CEvelina Dun FNP

## 2022-07-12 ENCOUNTER — Encounter: Payer: Self-pay | Admitting: Family

## 2022-07-12 ENCOUNTER — Ambulatory Visit (INDEPENDENT_AMBULATORY_CARE_PROVIDER_SITE_OTHER): Payer: BC Managed Care – PPO | Admitting: Family

## 2022-07-12 VITALS — BP 120/72 | HR 105 | Temp 97.8°F | Ht 61.0 in | Wt 158.0 lb

## 2022-07-12 DIAGNOSIS — F419 Anxiety disorder, unspecified: Secondary | ICD-10-CM

## 2022-07-12 DIAGNOSIS — F900 Attention-deficit hyperactivity disorder, predominantly inattentive type: Secondary | ICD-10-CM

## 2022-07-12 DIAGNOSIS — R7303 Prediabetes: Secondary | ICD-10-CM

## 2022-07-12 DIAGNOSIS — G4733 Obstructive sleep apnea (adult) (pediatric): Secondary | ICD-10-CM

## 2022-07-12 DIAGNOSIS — E785 Hyperlipidemia, unspecified: Secondary | ICD-10-CM | POA: Diagnosis not present

## 2022-07-12 DIAGNOSIS — Z6832 Body mass index (BMI) 32.0-32.9, adult: Secondary | ICD-10-CM | POA: Diagnosis not present

## 2022-07-12 DIAGNOSIS — F32A Depression, unspecified: Secondary | ICD-10-CM

## 2022-07-12 MED ORDER — SEMAGLUTIDE (1 MG/DOSE) 4 MG/3ML ~~LOC~~ SOPN: 1.0000 mg | PEN_INJECTOR | SUBCUTANEOUS | 3 refills | Status: DC

## 2022-07-12 MED ORDER — BUSPIRONE HCL 5 MG PO TABS: 5.0000 mg | ORAL_TABLET | Freq: Two times a day (BID) | ORAL | 3 refills | Status: DC | PRN

## 2022-07-12 MED ORDER — VENLAFAXINE HCL ER 75 MG PO CP24: ORAL_CAPSULE | ORAL | 3 refills | Status: DC

## 2022-07-12 MED ORDER — AMPHETAMINE-DEXTROAMPHET ER 10 MG PO CP24: 10.0000 mg | ORAL_CAPSULE | Freq: Every morning | ORAL | 0 refills | Status: DC

## 2022-07-12 NOTE — Assessment & Plan Note (Signed)
Continue adderall at current dosing.  Patient reports significant improvement in her ADHD symptoms

## 2022-07-12 NOTE — Assessment & Plan Note (Signed)
Patient is doing well with Ozempic.  Will increase dose to 53m.   I will put in orders for her so that she is able to get her labs drawn fasting.

## 2022-07-12 NOTE — Progress Notes (Signed)
Established Patient Office Visit  Subjective:  Patient ID: Terri Andrews, female    DOB: 10-24-1980  Age: 42 y.o. MRN: [id number]  Chief Complaint  Patient presents with   Follow-up    1 month follow up    1 month f/u.   Pt. Here for her 1 month.  At last appt, we changed her anxiety meds, added buspar for her to take as needed while she is at school.  She says that while it does not work as well as the clonazepam, she does notice a difference.   Would like to increase the ozempic to 1 mg.   No other concerns today.      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 Conceived on clomid s/p uterine septum removed   History of postpartum depression 10/25/2017   IBS (irritable bowel syndrome)    constipation   Kidney stones    Melasma    lip   Psoriasis    on Humira Dr. Phillip Heal    Snoring 10/08/2021   UTI (urinary tract infection)     Social History   Socioeconomic History   Marital status: Married    Spouse name: Not on file   Number of children: Not on file   Years of education: Not on file   Highest education level: Not on file  Occupational History   Not on file  Tobacco Use   Smoking status: Never   Smokeless tobacco: Never  Substance and Sexual Activity   Alcohol use: No    Alcohol/week: 0.0 standard drinks of alcohol   Drug use: No   Sexual activity: Yes    Partners: Male  Other Topics Concern   Not on file  Social History Narrative   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    Social Determinants of Health   Financial Resource Strain: Not on file  Food Insecurity: Not on file  Transportation Needs: Not on file  Physical Activity: Not  on file  Stress: Not on file  Social Connections: Not on file  Intimate Partner Violence: Not on file    Family History  Problem Relation Age of Onset   Rashes / Skin problems Mother    Other Mother        abnormal mammogram but not cancer   Hyperlipidemia Father    Rashes / Skin problems Father    Rashes / Skin problems Sister    Breast cancer Paternal Aunt     No Known Allergies  Review of Systems  All other systems reviewed and are negative.      Objective:   BP 120/72   Pulse (!) 105   Ht 5' 1"$  (1.549 m)   Wt 158 lb (71.7 kg)   SpO2 97%   BMI 29.85 kg/m   Vitals:   07/12/22 1553  BP: 120/72  Pulse: (!) 105  Height: 5' 1"$  (1.549 m)  Weight: 158 lb (71.7 kg)  SpO2: 97%  BMI (Calculated): 29.87    Physical Exam Vitals and nursing note reviewed.  Constitutional:      Appearance: Normal appearance. She is normal weight.  HENT:     Head: Normocephalic and atraumatic.  Eyes:     Pupils: Pupils are equal, round, and reactive to light.  Cardiovascular:     Rate and Rhythm: Normal rate.  Pulmonary:     Effort: Pulmonary effort is normal.  Musculoskeletal:     Cervical back: Normal range of motion.  Neurological:     General: No focal deficit present.     Mental Status: She is alert and oriented to person, place, and time.  Psychiatric:        Mood and Affect: Mood normal.        Behavior: Behavior normal.        Thought Content: Thought content normal.        Judgment: Judgment normal.      No results found for any visits on 07/12/22.  No results found for this or any previous visit (from the past 2160 hour(s)).    Assessment & Plan:   Problem List Items Addressed This Visit     Anxiety and depression    Continue current medications - patient appears to be well controlled on current regimen.       Relevant Medications   busPIRone (BUSPAR) 5 MG tablet   OSA (obstructive sleep apnea)   Hyperlipidemia   Prediabetes - Primary    Patient  is doing well with Ozempic.  Will increase dose to 76m.   I will put in orders for her so that she is able to get her labs drawn fasting.      ADHD (attention deficit hyperactivity disorder), inattentive type    Continue adderall at current dosing.  Patient reports significant improvement in her ADHD symptoms       Other Visit Diagnoses     BMI 32.0-32.9,adult           Return in about 2 months (around 09/10/2022).   Total time spent: 20 minutes  AMechele Claude FNP  07/12/2022

## 2022-07-12 NOTE — Assessment & Plan Note (Signed)
Continue current medications - patient appears to be well controlled on current regimen.

## 2022-08-25 ENCOUNTER — Other Ambulatory Visit: Payer: Self-pay | Admitting: Family

## 2022-08-26 ENCOUNTER — Other Ambulatory Visit: Payer: Self-pay

## 2022-08-27 ENCOUNTER — Other Ambulatory Visit: Payer: Self-pay | Admitting: Family

## 2022-08-27 MED ORDER — AMPHETAMINE-DEXTROAMPHET ER 10 MG PO CP24: 10.0000 mg | ORAL_CAPSULE | Freq: Every morning | ORAL | 0 refills | Status: DC

## 2022-09-14 ENCOUNTER — Ambulatory Visit (INDEPENDENT_AMBULATORY_CARE_PROVIDER_SITE_OTHER): Payer: BC Managed Care – PPO | Admitting: Family

## 2022-09-14 VITALS — BP 120/68 | HR 98 | Ht 61.0 in | Wt 141.4 lb

## 2022-09-14 DIAGNOSIS — E559 Vitamin D deficiency, unspecified: Secondary | ICD-10-CM

## 2022-09-14 DIAGNOSIS — R7303 Prediabetes: Secondary | ICD-10-CM

## 2022-09-14 DIAGNOSIS — I10 Essential (primary) hypertension: Secondary | ICD-10-CM

## 2022-09-14 DIAGNOSIS — E782 Mixed hyperlipidemia: Secondary | ICD-10-CM | POA: Diagnosis not present

## 2022-09-14 DIAGNOSIS — E538 Deficiency of other specified B group vitamins: Secondary | ICD-10-CM | POA: Diagnosis not present

## 2022-09-14 MED ORDER — SEMAGLUTIDE(0.25 OR 0.5MG/DOS) 2 MG/3ML ~~LOC~~ SOPN: 3.0000 mL | PEN_INJECTOR | SUBCUTANEOUS | 3 refills | Status: DC

## 2022-09-18 ENCOUNTER — Encounter: Payer: Self-pay | Admitting: Family

## 2022-09-18 NOTE — Assessment & Plan Note (Signed)
Checking labs today.  Continue current therapy for lipid control. Will modify as needed based on labwork results.  

## 2022-09-18 NOTE — Assessment & Plan Note (Signed)
Checking labs today.  Will continue supplements as needed.  

## 2022-09-18 NOTE — Assessment & Plan Note (Signed)
A1C Continues to be in prediabetic ranges.  Will reassess at follow up after next lab check.  Patient counseled on dietary choices and verbalized understanding.   

## 2022-09-18 NOTE — Progress Notes (Signed)
Established Patient Office Visit  Subjective:  Patient ID: Terri Andrews, female    DOB: [birth year]  Age: 42 y.o. MRN: [id number]  Chief Complaint  Patient presents with   Follow-up    2 month follow up    Patient is here today for her 2 months follow up.  She has been feeling well since last appointment.   She does not have additional concerns to discuss today.  Labs are due today. She needs refills.   I have reviewed her active problem list, medication list, allergies, notes from last encounter, lab results for her appointment today.     No other concerns at this time.   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 Conceived on clomid s/p uterine septum removed   History of postpartum depression 10/25/2017   IBS (irritable bowel syndrome)    constipation   Kidney stones    Melasma    lip   Psoriasis    on Humira Dr. Cheree Ditto    Snoring 10/08/2021   UTI (urinary tract infection)     Past Surgical History:  Procedure Laterality Date   CERVICAL BIOPSY     KNEE SURGERY     cyst removal   UTERINE SEPTUM RESECTION  04/2012   Dr. Samuella Cota at [facility]    Social History   Socioeconomic History   Marital status: Married    Spouse name: Not on file   Number of children: Not on file   Years of education: Not on file   Highest education level: Not on file  Occupational History   Not on file  Tobacco Use   Smoking status: Never   Smokeless tobacco: Never  Substance and Sexual Activity   Alcohol use: No    Alcohol/week: 0.0 standard drinks of alcohol   Drug use: No   Sexual activity: Yes    Partners: Male  Other Topics Concern   Not on file  Social History  Narrative   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    Social Determinants of Health   Financial Resource Strain: Not on file  Food Insecurity: Not on file  Transportation Needs: Not on file  Physical Activity: Not on file  Stress: Not on file  Social Connections: Not on file  Intimate Partner Violence: Not on file    Family History  Problem Relation Age of Onset   Rashes / Skin problems Mother    Other Mother        abnormal mammogram but not cancer   Hyperlipidemia Father    Rashes / Skin problems Father    Rashes / Skin problems Sister    Breast cancer Paternal Aunt     No Known Allergies  Review of Systems  All other systems reviewed and are negative.      Objective:   BP 120/68   Pulse 98   Ht 5\' 1"  (1.549 m)   Wt 141 lb 6.4 oz (64.1 kg)   SpO2 99%   BMI 26.72 kg/m   Vitals:   09/14/22 1530  BP: 120/68  Pulse: 98  Height: 5\' 1"  (1.549 m)  Weight: 141 lb 6.4 oz (64.1 kg)  SpO2: 99%  BMI (Calculated): 26.73    Physical Exam Vitals and nursing note reviewed.  Constitutional:      Appearance: Normal appearance. She is normal weight.  HENT:     Head: Normocephalic.     Mouth/Throat:     Mouth: Mucous membranes are moist.     Pharynx: Oropharynx is clear.  Eyes:     Extraocular Movements: Extraocular movements intact.     Conjunctiva/sclera: Conjunctivae normal.     Pupils: Pupils are equal, round, and reactive to light.  Cardiovascular:     Rate and Rhythm: Normal rate and regular rhythm.     Pulses: Normal pulses.  Pulmonary:     Effort: Pulmonary effort is normal.  Abdominal:     General: Abdomen is flat.  Musculoskeletal:        General: Normal range of motion.     Cervical back: Normal range of motion.  Neurological:     General: No focal deficit present.     Mental Status: She is alert and oriented to person, place, and time. Mental status is at baseline.  Psychiatric:        Mood and Affect: Mood normal.        Behavior: Behavior  normal.        Thought Content: Thought content normal.        Judgment: Judgment normal.      No results found for any visits on 09/14/22.  No results found for this or any previous visit (from the past 2160 hour(s)).     Assessment & Plan:   Problem List Items Addressed This Visit       Other   Vitamin B 12 deficiency   Vitamin D deficiency   Hyperlipidemia   Prediabetes - Primary   Other Visit Diagnoses     Essential hypertension, benign           Return in about 3 months (around 12/14/2022) for F/U.   Total time spent: 20 minutes  Miki Kins, FNP  09/14/2022

## 2022-09-25 ENCOUNTER — Other Ambulatory Visit: Payer: Self-pay | Admitting: Family

## 2022-09-27 MED ORDER — AMPHETAMINE-DEXTROAMPHET ER 10 MG PO CP24: 10.0000 mg | ORAL_CAPSULE | Freq: Every morning | ORAL | 0 refills | Status: DC

## 2022-11-02 ENCOUNTER — Other Ambulatory Visit: Payer: Self-pay | Admitting: Family

## 2022-11-02 ENCOUNTER — Encounter: Payer: Self-pay | Admitting: Family

## 2022-11-02 MED ORDER — AMPHETAMINE-DEXTROAMPHET ER 10 MG PO CP24: 10.0000 mg | ORAL_CAPSULE | Freq: Every morning | ORAL | 0 refills | Status: DC

## 2022-11-02 MED ORDER — CLONAZEPAM 0.5 MG PO TABS: 0.5000 mg | ORAL_TABLET | Freq: Every day | ORAL | 2 refills | Status: DC | PRN

## 2022-11-18 ENCOUNTER — Telehealth: Payer: BC Managed Care – PPO | Admitting: Physician Assistant

## 2022-11-18 DIAGNOSIS — R3989 Other symptoms and signs involving the genitourinary system: Secondary | ICD-10-CM

## 2022-11-18 MED ORDER — CEPHALEXIN 500 MG PO CAPS: 500.0000 mg | ORAL_CAPSULE | Freq: Two times a day (BID) | ORAL | 0 refills | Status: AC

## 2022-11-18 NOTE — Progress Notes (Signed)

## 2022-11-18 NOTE — Progress Notes (Signed)
I have spent 5 minutes in review of e-visit questionnaire, review and updating patient chart, medical decision making and response to patient.   Dazja Houchin Cody Montavious Wierzba, PA-C    

## 2022-12-03 ENCOUNTER — Other Ambulatory Visit: Payer: Self-pay | Admitting: Family

## 2022-12-04 MED ORDER — AMPHETAMINE-DEXTROAMPHET ER 10 MG PO CP24: 10.0000 mg | ORAL_CAPSULE | Freq: Every morning | ORAL | 0 refills | Status: DC

## 2022-12-14 ENCOUNTER — Encounter: Payer: Self-pay | Admitting: Family

## 2022-12-14 ENCOUNTER — Ambulatory Visit (INDEPENDENT_AMBULATORY_CARE_PROVIDER_SITE_OTHER): Payer: BC Managed Care – PPO | Admitting: Family

## 2022-12-14 VITALS — BP 130/80 | HR 90 | Ht 61.0 in | Wt 129.4 lb

## 2022-12-14 DIAGNOSIS — E538 Deficiency of other specified B group vitamins: Secondary | ICD-10-CM | POA: Diagnosis not present

## 2022-12-14 DIAGNOSIS — G4733 Obstructive sleep apnea (adult) (pediatric): Secondary | ICD-10-CM

## 2022-12-14 DIAGNOSIS — R7303 Prediabetes: Secondary | ICD-10-CM

## 2022-12-14 DIAGNOSIS — E782 Mixed hyperlipidemia: Secondary | ICD-10-CM

## 2022-12-14 DIAGNOSIS — E559 Vitamin D deficiency, unspecified: Secondary | ICD-10-CM

## 2022-12-14 DIAGNOSIS — I10 Essential (primary) hypertension: Secondary | ICD-10-CM

## 2022-12-14 NOTE — Assessment & Plan Note (Signed)
Checking labs today.  Will continue supplements as needed.  

## 2022-12-14 NOTE — Assessment & Plan Note (Signed)
Will repeat Sleep Study to re-evaluate her sleep apnea.  I will call once we have results.

## 2022-12-14 NOTE — Progress Notes (Signed)
Established Patient Office Visit  Subjective:  Patient ID: Terri Andrews, female    DOB: August 08, 1980  Age: 42 y.o. MRN: [id number]  Chief Complaint  Patient presents with   Follow-up    Patient is here today for her 3 months follow up.  She has been feeling well since last appointment.   She does have additional concerns to discuss today.  She has continued to lose weight with the Ozempic, and is managing well.  She is still having issues with her sleep apnea, says that she will still wake up gasping occasionally.  Asks if we might want to repeat her sleep study.   Labs are due today. She needs refills.   I have reviewed her active problem list, medication list, allergies, notes from last encounter, lab results for her appointment today.   No other concerns at this time.   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 Conceived on clomid s/p uterine septum removed   History of postpartum depression 10/25/2017   IBS (irritable bowel syndrome)    constipation   Kidney stones    Melasma    lip   Psoriasis    on Humira Dr. Cheree Ditto    Snoring 10/08/2021   UTI (urinary tract infection)     Past Surgical History:  Procedure Laterality Date   CERVICAL BIOPSY     KNEE SURGERY     cyst removal   UTERINE SEPTUM RESECTION  04/2012   Dr. 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    Social Determinants of Health   Financial Resource Strain: Not on file  Food Insecurity: Not on file  Transportation Needs: Not on file  Physical Activity: Not on file  Stress: Not on file  Social Connections: Not on file  Intimate Partner Violence: Not on file    Family History  Problem Relation Age of Onset   Rashes / Skin problems Mother    Other Mother        abnormal mammogram but not cancer   Hyperlipidemia Father    Rashes / Skin problems Father    Rashes / Skin problems Sister    Breast cancer Paternal Aunt     No Known Allergies  Review of Systems  All other systems reviewed and are negative.      Objective:   BP 130/80   Pulse 90   Ht 5\' 1"  (1.549 m)  Wt 129 lb 6.4 oz (58.7 kg)   SpO2 98%   BMI 24.45 kg/m   Vitals:   12/14/22 0911  BP: 130/80  Pulse: 90  Height: 5\' 1"  (1.549 m)  Weight: 129 lb 6.4 oz (58.7 kg)  SpO2: 98%  BMI (Calculated): 24.46    Physical Exam Vitals and nursing note reviewed.  Constitutional:      Appearance: Normal appearance. She is normal weight.  HENT:     Head: Normocephalic.  Eyes:     Extraocular Movements: Extraocular movements intact.     Conjunctiva/sclera: Conjunctivae normal.     Pupils: Pupils are equal, round, and reactive to light.  Cardiovascular:     Rate and Rhythm: Normal rate.  Pulmonary:     Effort: Pulmonary effort is normal.  Neurological:     General: No focal deficit present.     Mental Status: She is alert and oriented to person, place, and time. Mental status is at baseline.  Psychiatric:        Mood and Affect: Mood normal.        Behavior: Behavior normal.        Thought Content: Thought content normal.        Judgment: Judgment  normal.      No results found for any visits on 12/14/22.  No results found for this or any previous visit (from the past 2160 hour(s)).     Assessment & Plan:   Problem List Items Addressed This Visit       Active Problems   Vitamin B 12 deficiency    Checking labs today.  Will continue supplements as needed.       Relevant Orders   CBC With Differential   CMP14+EGFR   Vitamin B12   Vitamin D deficiency    Checking labs today.  Will continue supplements as needed.       Relevant Orders   VITAMIN D 25 Hydroxy (Vit-D Deficiency, Fractures)   CBC With Differential   CMP14+EGFR   OSA (obstructive sleep apnea)   Relevant Orders   Ambulatory referral to Sleep Studies   CBC With Differential   CMP14+EGFR   Hyperlipidemia    Checking labs today.  Continue current therapy for lipid control. Will modify as needed based on labwork results.       Relevant Orders   Lipid panel   CBC With Differential   CMP14+EGFR   Prediabetes - Primary    A1C Continues to be in prediabetic ranges.  Will reassess at follow up after next lab check.  Patient counseled on dietary choices and verbalized understanding.  Patient educated on foods that contain carbohydrates and the need to decrease intake.  We discussed prediabetes, and what it means and the need for strict dietary control to prevent progression to type 2 diabetes.  Advised to decrease intake of sugary drinks, including sodas, sweet tea, and some juices, and of starch and sugar heavy foods (ie., potatoes, rice, bread, pasta, desserts). She verbalizes understanding and agreement with the changes discussed today.        Relevant Orders   CBC With Differential   CMP14+EGFR   Hemoglobin A1c   Other Visit Diagnoses     Essential hypertension, benign       Relevant Orders   CBC With Differential   CMP14+EGFR       Return in about 4 months (around 04/16/2023).   Total time spent: 20 minutes  Miki Kins,  FNP  12/14/2022  This document may have been prepared by [employer] Voice Recognition software and as such may include unintentional dictation errors.

## 2022-12-14 NOTE — Assessment & Plan Note (Signed)
Checking labs today.  Continue current therapy for lipid control. Will modify as needed based on labwork results.  

## 2022-12-14 NOTE — Assessment & Plan Note (Signed)

## 2022-12-15 LAB — CBC WITH DIFFERENTIAL
Basophils Absolute: 0 10*3/uL (ref 0.0–0.2)
Basos: 1 %
EOS (ABSOLUTE): 0.2 10*3/uL (ref 0.0–0.4)
Eos: 3 %
Hematocrit: 43.5 % (ref 34.0–46.6)
Hemoglobin: 14.1 g/dL (ref 11.1–15.9)
Immature Grans (Abs): 0 10*3/uL (ref 0.0–0.1)
Immature Granulocytes: 0 %
Lymphocytes Absolute: 1.4 10*3/uL (ref 0.7–3.1)
Lymphs: 25 %
MCH: 28.5 pg (ref 26.6–33.0)
MCHC: 32.4 g/dL (ref 31.5–35.7)
MCV: 88 fL (ref 79–97)
Monocytes Absolute: 0.4 10*3/uL (ref 0.1–0.9)
Monocytes: 7 %
Neutrophils Absolute: 3.4 10*3/uL (ref 1.4–7.0)
Neutrophils: 64 %
RBC: 4.95 x10E6/uL (ref 3.77–5.28)
RDW: 12.3 % (ref 11.7–15.4)
WBC: 5.4 10*3/uL (ref 3.4–10.8)

## 2022-12-15 LAB — CMP14+EGFR
ALT: 11 IU/L (ref 0–32)
AST: 15 IU/L (ref 0–40)
Albumin: 4.3 g/dL (ref 3.9–4.9)
Alkaline Phosphatase: 62 IU/L (ref 44–121)
BUN/Creatinine Ratio: 13 (ref 9–23)
BUN: 10 mg/dL (ref 6–20)
Bilirubin Total: 0.5 mg/dL (ref 0.0–1.2)
CO2: 24 mmol/L (ref 20–29)
Calcium: 9.3 mg/dL (ref 8.7–10.2)
Chloride: 101 mmol/L (ref 96–106)
Creatinine, Ser: 0.77 mg/dL (ref 0.57–1.00)
Globulin, Total: 2.2 g/dL (ref 1.5–4.5)
Glucose: 75 mg/dL (ref 70–99)
Potassium: 4.1 mmol/L (ref 3.5–5.2)
Sodium: 139 mmol/L (ref 134–144)
Total Protein: 6.5 g/dL (ref 6.0–8.5)
eGFR: 101 mL/min/{1.73_m2} (ref >59)

## 2022-12-15 LAB — LIPID PANEL
Chol/HDL Ratio: 3.4 ratio (ref 0.0–4.4)
Cholesterol, Total: 179 mg/dL (ref 100–199)
HDL: 53 mg/dL (ref >39)
LDL Chol Calc (NIH): 106 mg/dL — ABNORMAL HIGH (ref 0–99)
Triglycerides: 111 mg/dL (ref 0–149)
VLDL Cholesterol Cal: 20 mg/dL (ref 5–40)

## 2022-12-15 LAB — HEMOGLOBIN A1C
Est. average glucose Bld gHb Est-mCnc: 103 mg/dL
Hgb A1c MFr Bld: 5.2 % (ref 4.8–5.6)

## 2022-12-15 LAB — VITAMIN B12: Vitamin B-12: 250 pg/mL (ref 232–1245)

## 2022-12-15 LAB — VITAMIN D 25 HYDROXY (VIT D DEFICIENCY, FRACTURES): Vit D, 25-Hydroxy: 39.4 ng/mL (ref 30.0–100.0)

## 2023-01-06 ENCOUNTER — Other Ambulatory Visit: Payer: Self-pay | Admitting: Family

## 2023-01-06 MED ORDER — AMPHETAMINE-DEXTROAMPHET ER 10 MG PO CP24: 10.0000 mg | ORAL_CAPSULE | Freq: Every morning | ORAL | 0 refills | Status: DC

## 2023-02-05 ENCOUNTER — Other Ambulatory Visit: Payer: Self-pay | Admitting: Family

## 2023-02-07 ENCOUNTER — Other Ambulatory Visit: Payer: Self-pay | Admitting: Family

## 2023-02-07 MED ORDER — AMPHETAMINE-DEXTROAMPHET ER 10 MG PO CP24: 10.0000 mg | ORAL_CAPSULE | Freq: Every morning | ORAL | 0 refills | Status: DC

## 2023-03-11 ENCOUNTER — Other Ambulatory Visit: Payer: Self-pay | Admitting: Family

## 2023-03-11 MED ORDER — AMPHETAMINE-DEXTROAMPHET ER 10 MG PO CP24: 10.0000 mg | ORAL_CAPSULE | Freq: Every morning | ORAL | 0 refills | Status: DC

## 2023-03-17 ENCOUNTER — Telehealth: Payer: BC Managed Care – PPO | Admitting: Physician Assistant

## 2023-03-17 ENCOUNTER — Ambulatory Visit: Payer: BC Managed Care – PPO | Admitting: Family

## 2023-03-17 DIAGNOSIS — R3989 Other symptoms and signs involving the genitourinary system: Secondary | ICD-10-CM

## 2023-03-17 MED ORDER — CEPHALEXIN 500 MG PO CAPS: 500.0000 mg | ORAL_CAPSULE | Freq: Two times a day (BID) | ORAL | 0 refills | Status: AC

## 2023-03-17 NOTE — Progress Notes (Signed)
I have spent 5 minutes in review of e-visit questionnaire, review and updating patient chart, medical decision making and response to patient.   Mia Milan Cody Jacklynn Dehaas, PA-C    

## 2023-03-17 NOTE — Progress Notes (Signed)

## 2023-03-21 ENCOUNTER — Ambulatory Visit: Payer: BC Managed Care – PPO | Admitting: Family

## 2023-04-12 ENCOUNTER — Other Ambulatory Visit: Payer: Self-pay | Admitting: Family

## 2023-04-14 ENCOUNTER — Other Ambulatory Visit: Payer: Self-pay | Admitting: Family

## 2023-04-15 ENCOUNTER — Encounter: Payer: Self-pay | Admitting: Family

## 2023-04-18 ENCOUNTER — Other Ambulatory Visit: Payer: Self-pay

## 2023-04-18 MED ORDER — AMPHETAMINE-DEXTROAMPHET ER 10 MG PO CP24: 10.0000 mg | ORAL_CAPSULE | Freq: Every morning | ORAL | 0 refills | Status: DC

## 2023-04-20 ENCOUNTER — Ambulatory Visit (INDEPENDENT_AMBULATORY_CARE_PROVIDER_SITE_OTHER): Payer: BC Managed Care – PPO | Admitting: Family

## 2023-04-20 ENCOUNTER — Encounter: Payer: Self-pay | Admitting: Family

## 2023-04-20 VITALS — BP 120/80 | HR 92 | Ht 61.0 in | Wt 132.4 lb

## 2023-04-20 DIAGNOSIS — R7303 Prediabetes: Secondary | ICD-10-CM | POA: Diagnosis not present

## 2023-04-20 DIAGNOSIS — E538 Deficiency of other specified B group vitamins: Secondary | ICD-10-CM

## 2023-04-20 DIAGNOSIS — Z79899 Other long term (current) drug therapy: Secondary | ICD-10-CM

## 2023-04-20 DIAGNOSIS — F900 Attention-deficit hyperactivity disorder, predominantly inattentive type: Secondary | ICD-10-CM | POA: Diagnosis not present

## 2023-04-20 DIAGNOSIS — Z0130 Encounter for examination of blood pressure without abnormal findings: Secondary | ICD-10-CM

## 2023-04-20 DIAGNOSIS — E785 Hyperlipidemia, unspecified: Secondary | ICD-10-CM

## 2023-04-20 DIAGNOSIS — E559 Vitamin D deficiency, unspecified: Secondary | ICD-10-CM

## 2023-04-23 LAB — TOXASSURE SELECT 13 (MW), URINE

## 2023-04-23 NOTE — Assessment & Plan Note (Signed)
Continue current therapy for lipid control. Will modify as needed based on labwork results.   

## 2023-04-23 NOTE — Assessment & Plan Note (Signed)

## 2023-04-23 NOTE — Assessment & Plan Note (Signed)
Continue supplements. Will recheck levels at follow up appointment, since she has been stable.

## 2023-04-23 NOTE — Progress Notes (Signed)
Established Patient Office Visit  Subjective:  Patient ID: Terri Andrews, female    DOB: 03/28/1981  Age: 42 y.o. MRN: [id number]  Chief Complaint  Patient presents with   Follow-up    Follow up    Patient is here today for her 3 months follow up.  She has been feeling well since last appointment.   She does not have additional concerns to discuss today.  Labs are due today. - Need to get a urine sample She needs refills.   I have reviewed her active problem list, medication list, allergies, notes from last encounter, lab results for her appointment today.      No other concerns at this time.   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 Conceived on clomid s/p uterine septum removed   History of postpartum depression 10/25/2017   IBS (irritable bowel syndrome)    constipation   Kidney stones    Melasma    lip   Psoriasis    on Humira Dr. Cheree Ditto    Snoring 10/08/2021   UTI (urinary tract infection)     Past Surgical History:  Procedure Laterality Date   CERVICAL BIOPSY     KNEE SURGERY     cyst removal   UTERINE SEPTUM RESECTION  04/2012   Dr. 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    Social Determinants of Health   Financial Resource Strain: Not on file  Food Insecurity: Not on file  Transportation Needs: Not on file  Physical Activity: Not on file  Stress: Not on file  Social Connections: Not on file  Intimate Partner Violence: Not on file    Family History  Problem Relation Age of Onset   Rashes / Skin problems Mother    Other Mother        abnormal mammogram but not cancer   Hyperlipidemia Father    Rashes / Skin problems Father    Rashes / Skin problems Sister    Breast cancer Paternal Aunt     No Known Allergies  Review of Systems  All other systems reviewed and are negative.      Objective:   BP 120/80   Pulse 92   Ht 5\' 1"  (1.549 m)   Wt 132 lb 6.4 oz (60.1 kg)   SpO2 98%   BMI 25.02 kg/m   Vitals:   04/20/23 1414  BP: 120/80  Pulse: 92  Height: 5\' 1"  (1.549 m)  Weight: 132 lb 6.4 oz (60.1 kg)  SpO2: 98%  BMI (Calculated): 25.03    Physical Exam Vitals and nursing note reviewed.  Constitutional:      Appearance: Normal appearance. She is normal weight.  HENT:     Head: Normocephalic.  Eyes:     Extraocular Movements: Extraocular movements intact.     Conjunctiva/sclera: Conjunctivae normal.     Pupils: Pupils are equal, round, and reactive to light.  Cardiovascular:     Rate and Rhythm: Normal rate.  Pulmonary:     Effort: Pulmonary effort is normal.  Neurological:     General: No focal deficit present.     Mental Status: She is alert and oriented to person, place, and time. Mental status is at baseline.  Psychiatric:        Mood and Affect: Mood normal.        Behavior: Behavior normal.        Thought Content: Thought content normal.      No results found for any visits on 04/20/23.  No results found for this or any previous visit (from the past 2160 hour(s)).     Assessment & Plan:   Problem List Items Addressed This Visit        Active Problems   Vitamin B 12 deficiency    Continue supplements. Will recheck levels at follow up appointment, since she has been stable.       Vitamin D deficiency    Continue supplements. Will recheck levels at follow up appointment, since she has been stable.      Hyperlipidemia    Continue current therapy for lipid control. Will modify as needed based on labwork results.       Prediabetes - Primary    A1C Continues to be in prediabetic ranges.  Will reassess at follow up after next lab check.  Patient counseled on dietary choices and verbalized understanding.  Patient educated on foods that contain carbohydrates and the need to decrease intake.  We discussed prediabetes, and what it means and the need for strict dietary control to prevent progression to type 2 diabetes.  Advised to decrease intake of sugary drinks, including sodas, sweet tea, and some juices, and of starch and sugar heavy foods (ie., potatoes, rice, bread, pasta, desserts). She verbalizes understanding and agreement with the changes discussed today.        ADHD (attention deficit hyperactivity disorder), inattentive type    Patient stable.  Well controlled with current therapy.   Continue current meds.        Other Visit Diagnoses     Long term current use of therapeutic drug       UDS sent to [facility] today.  Will contact with results.   Relevant Orders   ToxASSURE Select 13 (MW), Urine       Return in about 4 months (around 08/18/2023) for F/U.   Total time spent: 20 minutes  Miki Kins, FNP  04/20/2023   This document may have been prepared by [facility] Voice Recognition software and as such may include unintentional dictation errors.

## 2023-04-23 NOTE — Assessment & Plan Note (Signed)
Patient stable.  Well controlled with current therapy.   Continue current meds.  

## 2023-05-13 ENCOUNTER — Telehealth: Payer: BC Managed Care – PPO | Admitting: Family Medicine

## 2023-05-13 DIAGNOSIS — R399 Unspecified symptoms and signs involving the genitourinary system: Secondary | ICD-10-CM | POA: Diagnosis not present

## 2023-05-13 MED ORDER — CEPHALEXIN 500 MG PO CAPS: 500.0000 mg | ORAL_CAPSULE | Freq: Two times a day (BID) | ORAL | 0 refills | Status: AC

## 2023-05-13 NOTE — Progress Notes (Signed)
E-Visit for Urinary Problems  We are sorry that you are not feeling well.  Here is how we plan to help!  Based on what you shared with me it looks like you most likely have a simple urinary tract infection.  A UTI (Urinary Tract Infection) is a bacterial infection of the bladder.  Most cases of urinary tract infections are simple to treat but a key part of your care is to encourage you to drink plenty of fluids and watch your symptoms carefully.  I have prescribed Keflex 500 mg twice a day for 7 days.  Your symptoms should gradually improve. Call us if the burning in your urine worsens, you develop worsening fever, back pain or pelvic pain or if your symptoms do not resolve after completing the antibiotic.  Urinary tract infections can be prevented by drinking plenty of water to keep your body hydrated.  Also be sure when you wipe, wipe from front to back and don't hold it in!  If possible, empty your bladder every 4 hours.  HOME CARE Drink plenty of fluids Compete the full course of the antibiotics even if the symptoms resolve Remember, when you need to go.go. Holding in your urine can increase the likelihood of getting a UTI! GET HELP RIGHT AWAY IF: You cannot urinate You get a high fever Worsening back pain occurs You see blood in your urine You feel sick to your stomach or throw up You feel like you are going to pass out  MAKE SURE YOU  Understand these instructions. Will watch your condition. Will get help right away if you are not doing well or get worse.   Thank you for choosing an e-visit.  Your e-visit answers were reviewed by a board certified advanced clinical practitioner to complete your personal care plan. Depending upon the condition, your plan could have included both over the counter or prescription medications.  Please review your pharmacy choice. Make sure the pharmacy is open so you can pick up prescription now. If there is a problem, you may contact your  provider through MyChart messaging and have the prescription routed to another pharmacy.  Your safety is important to us. If you have drug allergies check your prescription carefully.   For the next 24 hours you can use MyChart to ask questions about today's visit, request a non-urgent call back, or ask for a work or school excuse. You will get an email in the next two days asking about your experience. I hope that your e-visit has been valuable and will speed your recovery.    have provided 5 minutes of non face to face time during this encounter for chart review and documentation.   

## 2023-05-16 ENCOUNTER — Other Ambulatory Visit: Payer: Self-pay | Admitting: Family

## 2023-05-24 MED ORDER — AMPHETAMINE-DEXTROAMPHET ER 10 MG PO CP24: 10.0000 mg | ORAL_CAPSULE | Freq: Every morning | ORAL | 0 refills | Status: DC

## 2023-06-30 ENCOUNTER — Other Ambulatory Visit: Payer: Self-pay | Admitting: Family

## 2023-07-02 ENCOUNTER — Other Ambulatory Visit: Payer: Self-pay | Admitting: Family

## 2023-07-02 MED ORDER — AMPHETAMINE-DEXTROAMPHET ER 10 MG PO CP24: 10.0000 mg | ORAL_CAPSULE | Freq: Every morning | ORAL | 0 refills | Status: DC

## 2023-07-27 ENCOUNTER — Encounter: Payer: Self-pay | Admitting: Family

## 2023-08-04 ENCOUNTER — Other Ambulatory Visit: Payer: Self-pay | Admitting: Family

## 2023-08-05 MED ORDER — AMPHETAMINE-DEXTROAMPHET ER 10 MG PO CP24: 10.0000 mg | ORAL_CAPSULE | Freq: Every morning | ORAL | 0 refills | Status: DC

## 2023-08-22 ENCOUNTER — Ambulatory Visit (INDEPENDENT_AMBULATORY_CARE_PROVIDER_SITE_OTHER): Payer: Self-pay | Admitting: Family

## 2023-08-22 ENCOUNTER — Encounter: Payer: Self-pay | Admitting: Family

## 2023-08-22 VITALS — BP 114/84 | HR 98 | Ht 61.0 in | Wt 132.8 lb

## 2023-08-22 DIAGNOSIS — F900 Attention-deficit hyperactivity disorder, predominantly inattentive type: Secondary | ICD-10-CM

## 2023-08-22 DIAGNOSIS — R7303 Prediabetes: Secondary | ICD-10-CM

## 2023-08-22 DIAGNOSIS — G4733 Obstructive sleep apnea (adult) (pediatric): Secondary | ICD-10-CM

## 2023-08-22 DIAGNOSIS — E538 Deficiency of other specified B group vitamins: Secondary | ICD-10-CM

## 2023-08-22 DIAGNOSIS — R0681 Apnea, not elsewhere classified: Secondary | ICD-10-CM

## 2023-08-22 DIAGNOSIS — Z0130 Encounter for examination of blood pressure without abnormal findings: Secondary | ICD-10-CM

## 2023-08-22 DIAGNOSIS — E559 Vitamin D deficiency, unspecified: Secondary | ICD-10-CM | POA: Diagnosis not present

## 2023-08-22 MED ORDER — CLONAZEPAM 0.5 MG PO TABS: 0.5000 mg | ORAL_TABLET | Freq: Every day | ORAL | 2 refills | Status: AC | PRN

## 2023-08-23 ENCOUNTER — Other Ambulatory Visit: Payer: Self-pay | Admitting: Family

## 2023-08-23 DIAGNOSIS — F419 Anxiety disorder, unspecified: Secondary | ICD-10-CM

## 2023-08-23 DIAGNOSIS — F32A Depression, unspecified: Secondary | ICD-10-CM

## 2023-08-25 NOTE — Assessment & Plan Note (Signed)
 Patient stable.  Well controlled with current therapy.   Continue current meds.

## 2023-08-25 NOTE — Progress Notes (Signed)
 Established Patient Office Visit  Subjective:  Patient ID: Terri Andrews, female    DOB: Dec 02, 1980  Age: 43 y.o. MRN: [id number]  Chief Complaint  Patient presents with   Follow-up    4 month follow up    Patient is here today for her 4 months follow up.  She has been feeling fairly well since last appointment.   She does have additional concerns to discuss today.  She says that she has had some concerns regarding her sleep apnea.  She has had sleep studies before, but she has lost a significant amount of weight and she needs a repeat to evaluate her status again.  She has woken up from her sleep gasping for air and says that she does feel quite tired during the day still.   Labs are not due today. She needs refills.   I have reviewed her active problem list, medication list, allergies, health maintenance, notes from last encounter, lab results for her appointment today.     No other concerns at this time.   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 Conceived on clomid s/p uterine septum removed   History of postpartum depression 10/25/2017   IBS (irritable bowel syndrome)    constipation   Kidney stones    Melasma    lip   Psoriasis    on Humira Dr. Cheree Ditto    Snoring 10/08/2021   UTI (urinary tract infection)     Past Surgical History:  Procedure Laterality Date   CERVICAL BIOPSY     KNEE SURGERY     cyst removal   UTERINE SEPTUM RESECTION  04/2012   Dr[facility] 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    Social Drivers of Health   Financial Resource Strain: Not on file  Food Insecurity: Not on file  Transportation Needs: Not on file  Physical Activity: Not on file  Stress: Not on file  Social Connections: Not on file  Intimate Partner Violence: Not on file    Family History  Problem Relation Age of Onset   Rashes / Skin problems Mother    Other Mother        abnormal mammogram but not cancer   Hyperlipidemia Father    Rashes / Skin problems Father    Rashes / Skin problems Sister    Breast cancer Paternal Aunt     No Known Allergies  Review of Systems  Constitutional:  Positive for malaise/fatigue.  All other systems reviewed and are negative.      Objective:   BP 114/84   Pulse 98   Ht 5\' 1"  (1.549 m)   Wt 132 lb 12.8 oz (60.2 kg)   SpO2 98%   BMI 25.09 kg/m   Vitals:   08/22/23 1539  BP: 114/84  Pulse: 98  Height: 5\' 1"  (1.549 m)  Weight: 132 lb 12.8 oz (60.2 kg)  SpO2: 98%  BMI (Calculated): 25.11    Physical Exam Vitals and nursing note reviewed.  Constitutional:      General: She is awake.     Appearance: Normal appearance. She is well-developed and normal weight.  HENT:     Head: Normocephalic.  Eyes:     Extraocular Movements: Extraocular movements intact.     Conjunctiva/sclera: Conjunctivae normal.     Pupils: Pupils are equal, round, and reactive to light.  Cardiovascular:     Rate and Rhythm: Normal rate.  Pulmonary:     Effort: Pulmonary effort is normal.  Neurological:     General: No focal deficit present.     Mental Status: She is  alert and oriented to person, place, and time. Mental status is at baseline.  Psychiatric:        Mood and Affect: Mood normal.        Behavior: Behavior normal. Behavior is cooperative.        Thought Content: Thought content normal.      No results found for any visits on 08/22/23.  No results found for this or any previous visit (from the past 2160 hours).     Assessment & Plan:   Problem List Items Addressed This Visit       Respiratory   OSA (obstructive sleep apnea) - Primary   Ordering new sleep study for patient.  Will await results to determine next steps.  Reassess at follow up.      Relevant Orders   Ambulatory referral to Sleep Studies     Other   Vitamin B 12 deficiency   Patient stable.  Well controlled with current therapy.   Continue current meds.        Vitamin D deficiency   Patient stable.  Well controlled with current therapy.   Continue current meds.        Prediabetes   A1C Continues to be in prediabetic ranges.  Will reassess at follow up after next lab check.  Patient counseled on dietary choices and verbalized understanding.  Patient educated on foods that contain carbohydrates and the need to decrease intake.  We discussed prediabetes, and what it means and the need for strict dietary control to prevent progression to type 2 diabetes.  Advised to decrease intake of sugary drinks, including sodas, sweet tea, and some juices, and of starch and sugar heavy foods (ie., potatoes, rice, bread, pasta, desserts). She verbalizes understanding and agreement with the changes discussed today.        ADHD (attention deficit hyperactivity disorder), inattentive type   Patient stable.  Well controlled with current therapy.   Continue current meds.        Other Visit Diagnoses       Witnessed episode of apnea           No follow-ups on file.   Total time spent: 20 minutes  Miki Kins, FNP  08/22/2023   This document may  have been prepared by [employer] Voice Recognition software and as  such may include unintentional dictation errors.

## 2023-08-25 NOTE — Assessment & Plan Note (Signed)
 Ordering new sleep study for patient.  Will await results to determine next steps.  Reassess at follow up.

## 2023-08-25 NOTE — Assessment & Plan Note (Signed)

## 2023-09-12 ENCOUNTER — Other Ambulatory Visit: Payer: Self-pay | Admitting: Family

## 2023-09-12 MED ORDER — AMPHETAMINE-DEXTROAMPHET ER 20 MG PO CP24: 20.0000 mg | ORAL_CAPSULE | Freq: Every day | ORAL | 0 refills | Status: DC

## 2023-09-14 ENCOUNTER — Encounter: Payer: Self-pay | Admitting: Family

## 2023-09-27 ENCOUNTER — Telehealth: Admitting: Family Medicine

## 2023-09-27 DIAGNOSIS — R3989 Other symptoms and signs involving the genitourinary system: Secondary | ICD-10-CM

## 2023-09-27 MED ORDER — CEPHALEXIN 500 MG PO CAPS: 500.0000 mg | ORAL_CAPSULE | Freq: Two times a day (BID) | ORAL | 0 refills | Status: AC

## 2023-09-27 NOTE — Progress Notes (Signed)

## 2023-10-12 ENCOUNTER — Encounter: Payer: Self-pay | Admitting: Family

## 2023-10-12 ENCOUNTER — Ambulatory Visit: Admitting: Family

## 2023-10-12 VITALS — BP 108/52 | HR 105 | Ht 61.0 in | Wt 134.8 lb

## 2023-10-12 DIAGNOSIS — Z0130 Encounter for examination of blood pressure without abnormal findings: Secondary | ICD-10-CM

## 2023-10-12 DIAGNOSIS — N951 Menopausal and female climacteric states: Secondary | ICD-10-CM | POA: Diagnosis not present

## 2023-10-12 DIAGNOSIS — R319 Hematuria, unspecified: Secondary | ICD-10-CM | POA: Diagnosis not present

## 2023-10-12 LAB — URINALYSIS, ROUTINE W REFLEX MICROSCOPIC
Bilirubin, UA: NEGATIVE
Glucose, UA: NEGATIVE
Ketones, UA: NEGATIVE
Leukocytes,UA: NEGATIVE
Nitrite, UA: NEGATIVE
Protein,UA: NEGATIVE
RBC, UA: NEGATIVE
Specific Gravity, UA: 1.025 (ref 1.005–1.030)
Urobilinogen, Ur: 1 mg/dL (ref 0.2–1.0)
pH, UA: 6.5 (ref 5.0–7.5)

## 2023-10-12 LAB — POCT URINALYSIS DIPSTICK
Bilirubin, UA: NEGATIVE
Blood, UA: POSITIVE
Glucose, UA: NEGATIVE
Ketones, UA: NEGATIVE
Leukocytes, UA: NEGATIVE
Nitrite, UA: NEGATIVE
Protein, UA: NEGATIVE
Spec Grav, UA: 1.025 (ref 1.010–1.025)
Urobilinogen, UA: 0.2 U/dL
pH, UA: 6 (ref 5.0–8.0)

## 2023-10-12 MED ORDER — CLONIDINE HCL 0.1 MG PO TABS: 0.1000 mg | ORAL_TABLET | Freq: Two times a day (BID) | ORAL | 0 refills | Status: DC

## 2023-10-12 MED ORDER — TAMSULOSIN HCL 0.4 MG PO CAPS: 0.4000 mg | ORAL_CAPSULE | Freq: Every day | ORAL | 0 refills | Status: DC

## 2023-10-14 ENCOUNTER — Ambulatory Visit: Payer: Self-pay

## 2023-10-14 LAB — URINE CULTURE: Organism ID, Bacteria: NO GROWTH

## 2023-10-24 ENCOUNTER — Encounter: Payer: Self-pay | Admitting: Family

## 2023-10-24 ENCOUNTER — Other Ambulatory Visit: Payer: Self-pay

## 2023-10-25 ENCOUNTER — Other Ambulatory Visit: Payer: Self-pay | Admitting: Family

## 2023-10-25 MED ORDER — AMPHETAMINE-DEXTROAMPHET ER 20 MG PO CP24: 20.0000 mg | ORAL_CAPSULE | Freq: Every day | ORAL | 0 refills | Status: DC

## 2023-11-09 ENCOUNTER — Other Ambulatory Visit: Payer: Self-pay | Admitting: Family

## 2023-11-16 ENCOUNTER — Encounter: Payer: Self-pay | Admitting: Family

## 2023-11-16 ENCOUNTER — Ambulatory Visit: Admitting: Family

## 2023-11-16 VITALS — BP 110/78 | HR 81 | Ht 61.0 in | Wt 134.0 lb

## 2023-11-16 DIAGNOSIS — Z202 Contact with and (suspected) exposure to infections with a predominantly sexual mode of transmission: Secondary | ICD-10-CM

## 2023-11-16 DIAGNOSIS — N760 Acute vaginitis: Secondary | ICD-10-CM | POA: Diagnosis not present

## 2023-11-16 DIAGNOSIS — Z0130 Encounter for examination of blood pressure without abnormal findings: Secondary | ICD-10-CM

## 2023-11-16 NOTE — Progress Notes (Signed)
 Established Patient Office Visit  Subjective:  Patient ID: Terri Andrews, female    DOB: May 02, 1981  Age: 43 y.o. MRN: [id number]  No chief complaint on file.   Had hematuria, had still been having some blood in her urine.   Asks if we can check her for STIs, she has a new partner.    No other concerns at this time.   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 Conceived on clomid s/p uterine septum removed   History of postpartum depression 10/25/2017   IBS (irritable bowel syndrome)    constipation   Kidney stones    Melasma    lip   Psoriasis    on Humira Dr. Arlyss    Snoring 10/08/2021   UTI (urinary tract infection)     Past Surgical History:  Procedure Laterality Date   CERVICAL BIOPSY     KNEE SURGERY     cyst removal   UTERINE SEPTUM RESECTION  04/2012   Dr. Gretel at [facility]    Social History   Socioeconomic History   Marital status: Married    Spouse name: Not on file   Number of children: Not on file   Years of education: Not on file   Highest education level: Not on file  Occupational History   Not on file  Tobacco Use   Smoking status: Never   Smokeless tobacco: Never  Substance and Sexual Activity   Alcohol use: No    Alcohol/week: 0.0 standard drinks of alcohol   Drug use: No   Sexual activity: Yes    Partners: Male  Other Topics Concern   Not on file  Social History Narrative   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    Social Drivers of Health   Financial Resource Strain: Not on file  Food Insecurity: Not on file  Transportation Needs: Not on file  Physical Activity: Not on file  Stress: Not on file  Social Connections:  Not on file  Intimate Partner Violence: Not on file    Family History  Problem Relation Age of Onset   Rashes / Skin problems Mother    Other Mother        abnormal mammogram but not cancer   Hyperlipidemia Father    Rashes / Skin problems Father    Rashes / Skin problems Sister    Breast cancer Paternal Aunt     No Known Allergies  Review of Systems  Genitourinary:  Positive for hematuria.  All other systems reviewed and are negative.      Objective:   BP 110/78   Pulse 81   Ht 5' 1 (1.549 m)   Wt 134 lb (60.8 kg)   SpO2 99%   BMI 25.32 kg/m   Vitals:   11/16/23 1550  BP: 110/78  Pulse: 81  Height: 5' 1 (1.549 m)  Weight: 134 lb (60.8 kg)  SpO2: 99%  BMI (Calculated): 25.33    Physical Exam Vitals and nursing note reviewed.  Constitutional:      Appearance: Normal appearance. She is normal weight.  HENT:  Head: Normocephalic.  Eyes:     Extraocular Movements: Extraocular movements intact.     Conjunctiva/sclera: Conjunctivae normal.     Pupils: Pupils are equal, round, and reactive to light.  Cardiovascular:     Rate and Rhythm: Normal rate.  Pulmonary:     Effort: Pulmonary effort is normal.  Neurological:     General: No focal deficit present.     Mental Status: She is alert and oriented to person, place, and time. Mental status is at baseline.  Psychiatric:        Mood and Affect: Mood normal.        Behavior: Behavior normal.        Thought Content: Thought content normal.        Judgment: Judgment normal.      No results found for any visits on 11/16/23.  Recent Results (from the past 2160 hours)  Urinalysis, Routine w reflex microscopic     Status: None   Collection Time: 10/12/23  3:00 PM  Result Value Ref Range   Specific Gravity, UA 1.025 1.005 - 1.030   pH, UA 6.5 5.0 - 7.5   Color, UA Yellow Yellow   Appearance Ur Clear Clear   Leukocytes,UA Negative Negative   Protein,UA Negative Negative/Trace   Glucose, UA Negative  Negative   Ketones, UA Negative Negative   RBC, UA Negative Negative   Bilirubin, UA Negative Negative   Urobilinogen, Ur 1.0 0.2 - 1.0 mg/dL   Nitrite, UA Negative Negative   Microscopic Examination Comment     Comment: Microscopic not indicated and not performed.  Urine Culture     Status: None   Collection Time: 10/12/23  3:00 PM   Specimen: Urine, Clean Catch   UR  Result Value Ref Range   Urine Culture, Routine Final report    Organism ID, Bacteria No growth   POCT Urinalysis Dipstick (18997)     Status: None   Collection Time: 10/12/23  3:44 PM  Result Value Ref Range   Color, UA Orange    Clarity, UA Cloudy    Glucose, UA Negative Negative   Bilirubin, UA Negative    Ketones, UA Negative    Spec Grav, UA 1.025 1.010 - 1.025   Blood, UA Positive    pH, UA 6.0 5.0 - 8.0   Protein, UA Negative Negative   Urobilinogen, UA 0.2 0.2 or 1.0 E.U./dL   Nitrite, UA Negative    Leukocytes, UA Negative Negative   Appearance Cloudy    Odor Yes        Assessment & Plan Acute vaginitis Contact with and (suspected) exposure to infections with a predominantly sexual mode of transmission STI testing ordered today.  Will call pt. With results when available.    Return as previously scheduled unless not improving.   Total time spent: 20 minutes  ALAN CHRISTELLA ARRANT, FNP  11/16/2023   This document may have been prepared by [facility] Voice Recognition software and as such may include unintentional dictation errors.

## 2023-11-18 LAB — ACUTE VIRAL HEPATITIS (HAV, HBV, HCV)
HCV Ab: NONREACTIVE
Hep A IgM: NEGATIVE
Hep B C IgM: NEGATIVE
Hepatitis B Surface Ag: NEGATIVE

## 2023-11-18 LAB — HIV ANTIBODY (ROUTINE TESTING W REFLEX): HIV Screen 4th Generation wRfx: NONREACTIVE

## 2023-11-18 LAB — HCV INTERPRETATION

## 2023-11-18 LAB — RPR W/REFLEX TO TREPSURE: RPR: NONREACTIVE

## 2023-11-18 LAB — TREPONEMAL ANTIBODIES, TPPA: Treponemal Antibodies, TPPA: NONREACTIVE

## 2023-11-19 LAB — NUSWAB VG+, HSV
Candida albicans, NAA: NEGATIVE
Candida glabrata, NAA: NEGATIVE
Chlamydia trachomatis, NAA: NEGATIVE
HSV 1 NAA: NEGATIVE
HSV 2 NAA: NEGATIVE
Neisseria gonorrhoeae, NAA: NEGATIVE
Trich vag by NAA: NEGATIVE

## 2023-11-21 ENCOUNTER — Ambulatory Visit: Payer: Self-pay

## 2023-11-28 ENCOUNTER — Other Ambulatory Visit: Payer: Self-pay

## 2023-11-28 DIAGNOSIS — F419 Anxiety disorder, unspecified: Secondary | ICD-10-CM

## 2023-11-28 MED ORDER — VENLAFAXINE HCL ER 75 MG PO CP24: ORAL_CAPSULE | ORAL | 3 refills | Status: DC

## 2023-12-03 ENCOUNTER — Encounter: Payer: Self-pay | Admitting: Family

## 2023-12-05 MED ORDER — AMPHETAMINE-DEXTROAMPHET ER 20 MG PO CP24: 20.0000 mg | ORAL_CAPSULE | Freq: Every day | ORAL | 0 refills | Status: DC

## 2023-12-08 NOTE — Addendum Note (Signed)
 Addended byBETHA ORION STABS on: 12/08/2023 01:38 PM   Modules accepted: Orders

## 2023-12-09 MED ORDER — AMPHETAMINE-DEXTROAMPHET ER 20 MG PO CP24: 20.0000 mg | ORAL_CAPSULE | Freq: Every day | ORAL | 0 refills | Status: DC

## 2023-12-21 ENCOUNTER — Ambulatory Visit: Admitting: Family

## 2023-12-26 ENCOUNTER — Ambulatory Visit: Admitting: Family

## 2024-01-01 ENCOUNTER — Encounter: Payer: Self-pay | Admitting: Family

## 2024-01-01 NOTE — Progress Notes (Signed)
 Acute Office Visit  Subjective:     Patient ID: Terri Andrews, female    DOB: 21-Apr-1981, 43 y.o.   MRN: [id number]  Patient is in today for  Chief Complaint  Patient presents with   Hematuria    Patient is here today with a few concerns:   Hematuria This is a new problem. The current episode started in the past 7 days. The problem has been waxing and waning since onset. She describes the hematuria as gross hematuria. The hematuria occurs during the terminal portion of her urinary stream. She reports no clotting in her urine stream. The pain is mild. She describes her urine color as light pink. Irritative symptoms include frequency.    Patient is also here with concerns for perimenopausal symptoms, says that she has been having hot flashes.  Asks if there is anything we can give her to help with this.   Review of Systems  Genitourinary:  Positive for frequency and hematuria.        Objective:    BP (!) 108/52   Pulse (!) 105   Ht 5' 1 (1.549 m)   Wt 134 lb 12.8 oz (61.1 kg)   SpO2 100%   BMI 25.47 kg/m   Physical Exam  Results for orders placed or performed in visit on 10/12/23  Urine Culture   Specimen: Urine, Clean Catch   UR  Result Value Ref Range   Urine Culture, Routine Final report    Organism ID, Bacteria No growth   Urinalysis, Routine w reflex microscopic  Result Value Ref Range   Specific Gravity, UA 1.025 1.005 - 1.030   pH, UA 6.5 5.0 - 7.5   Color, UA Yellow Yellow   Appearance Ur Clear Clear   Leukocytes,UA Negative Negative   Protein,UA Negative Negative/Trace   Glucose, UA Negative Negative   Ketones, UA Negative Negative   RBC, UA Negative Negative   Bilirubin, UA Negative Negative   Urobilinogen, Ur 1.0 0.2 - 1.0 mg/dL   Nitrite, UA Negative Negative   Microscopic Examination Comment   POCT Urinalysis Dipstick (18997)  Result Value Ref Range   Color, UA Orange    Clarity, UA Cloudy    Glucose, UA Negative Negative   Bilirubin,  UA Negative    Ketones, UA Negative    Spec Grav, UA 1.025 1.010 - 1.025   Blood, UA Positive    pH, UA 6.0 5.0 - 8.0   Protein, UA Negative Negative   Urobilinogen, UA 0.2 0.2 or 1.0 E.U./dL   Nitrite, UA Negative    Leukocytes, UA Negative Negative   Appearance Cloudy    Odor Yes     Recent Results (from the past 2160 hours)  Urinalysis, Routine w reflex microscopic     Status: None   Collection Time: 10/12/23  3:00 PM  Result Value Ref Range   Specific Gravity, UA 1.025 1.005 - 1.030   pH, UA 6.5 5.0 - 7.5   Color, UA Yellow Yellow   Appearance Ur Clear Clear   Leukocytes,UA Negative Negative   Protein,UA Negative Negative/Trace   Glucose, UA Negative Negative   Ketones, UA Negative Negative   RBC, UA Negative Negative   Bilirubin, UA Negative Negative   Urobilinogen, Ur 1.0 0.2 - 1.0 mg/dL   Nitrite, UA Negative Negative   Microscopic Examination Comment     Comment: Microscopic not indicated and not performed.  Urine Culture     Status: None   Collection Time: 10/12/23  3:00 PM   Specimen: Urine, Clean Catch   UR  Result Value Ref Range   Urine Culture, Routine Final report    Organism ID, Bacteria No growth   POCT Urinalysis Dipstick (18997)     Status: None   Collection Time: 10/12/23  3:44 PM  Result Value Ref Range   Color, UA Orange    Clarity, UA Cloudy    Glucose, UA Negative Negative   Bilirubin, UA Negative    Ketones, UA Negative    Spec Grav, UA 1.025 1.010 - 1.025   Blood, UA Positive    pH, UA 6.0 5.0 - 8.0   Protein, UA Negative Negative   Urobilinogen, UA 0.2 0.2 or 1.0 E.U./dL   Nitrite, UA Negative    Leukocytes, UA Negative Negative   Appearance Cloudy    Odor Yes   NuSwab VG+, HSV     Status: None   Collection Time: 11/16/23  3:54 PM  Result Value Ref Range   Atopobium vaginae Low - 0 Score   BVAB 2 Low - 0 Score   Megasphaera 1 Low - 0 Score    Comment: Calculate total score by adding the 3 individual bacterial vaginosis (BV)  marker scores together.  Total score is interpreted as follows: Total score 0-1: Indicates the absence of BV. Total score   2: Indeterminate for BV. Additional clinical                  data should be evaluated to establish a                  diagnosis. Total score 3-6: Indicates the presence of BV.    Candida albicans, NAA Negative Negative   Candida glabrata, NAA Negative Negative   Trich vag by NAA Negative Negative   Chlamydia trachomatis, NAA Negative Negative   Neisseria gonorrhoeae, NAA Negative Negative   HSV 1 NAA Negative Negative   HSV 2 NAA Negative Negative  RPR w/reflex to TrepSure     Status: None   Collection Time: 11/17/23  8:01 AM  Result Value Ref Range   RPR Non Reactive Non Reactive  HIV antibody (with reflex)     Status: None   Collection Time: 11/17/23  8:01 AM  Result Value Ref Range   HIV Screen 4th Generation wRfx Non Reactive Non Reactive    Comment: HIV-1/HIV-2 antibodies and HIV-1 p24 antigen were NOT detected. There is no laboratory evidence of HIV infection. HIV Negative   Acute Viral Hepatitis (HAV, HBV, HCV)     Status: None   Collection Time: 11/17/23  8:01 AM  Result Value Ref Range   Hep A IgM Negative Negative    Comment: A negative anti-HAV IgM result suggests no recent or current HAV infection.    Hepatitis B Surface Ag Negative Negative   Hep B C IgM Negative Negative   HCV Ab Non Reactive Non Reactive  Interpretation:     Status: None   Collection Time: 11/17/23  8:01 AM  Result Value Ref Range   HCV Interp 1: Comment     Comment: Not infected with HCV unless early or acute infection is suspected (which may be delayed in an immunocompromised individual), or other evidence exists to indicate HCV infection.   Treponemal Antibodies, TPPA     Status: None   Collection Time: 11/17/23  8:01 AM  Result Value Ref Range   Treponemal Antibodies, TPPA Non Reactive Non Reactive    Comment: This test  is intended ONLY for specimens that have  tested positive (reactive) or equivocal for Treponema pallidum antibodies prior to submission for testing. For the full CDC-recommended syphilis screening and diagnosis algorithm, [facility] offers test code 012005 RPR, Rfx Qn RPR/Confirm TP or 917654 T pallidum Screening Cascade.     Allergies as of 10/12/2023   No Known Allergies      Medication List        Accurate as of Oct 12, 2023 11:59 PM. If you have any questions, ask your nurse or doctor.          amphetamine -dextroamphetamine 20 MG 24 hr capsule Commonly known as: ADDERALL XR Take 1 capsule (20 mg total) by mouth daily.   busPIRone  5 MG tablet Commonly known as: BUSPAR  Take 1 tablet (5 mg total) by mouth 2 (two) times daily as needed (anxiety).   clonazePAM  0.5 MG tablet Commonly known as: KLONOPIN  Take 1 tablet (0.5 mg total) by mouth daily as needed for anxiety.   cloNIDine  0.1 MG tablet Commonly known as: CATAPRES  Take 1 tablet (0.1 mg total) by mouth 2 (two) times daily. Started by: ALAN CHRISTELLA ARRANT   Skyrizi (150 MG Dose) 75 MG/0.83ML Pskt Generic drug: risankizumab-rzaa(150 MG Dose) 2 doses 0 month, 1 month then every 3 months  Dr. Isenstein   tamsulosin  0.4 MG Caps capsule Commonly known as: FLOMAX  Take 1 capsule (0.4 mg total) by mouth daily after supper. Started by: ALAN CHRISTELLA ARRANT   venlafaxine  XR 75 MG 24 hr capsule Commonly known as: EFFEXOR -XR TAKE 1 CAPSULE(75 MG) BY MOUTH DAILY WITH BREAKFAST            Assessment & Plan Hematuria, unspecified type UA in office today abnormal, sending for UA/UC. Also sending tamsulosin  in case of calculi.   Perimenopausal vasomotor symptoms Sending clonidine  for pt for hot flashes.  Will reassess for effectiveness at follow up.    Return as scheduled unless not improving.  Total time spent: 20 minutes  ALAN CHRISTELLA ARRANT, FNP  10/12/2023   This document may have been prepared by [facility] Voice Recognition software and as such may include  unintentional dictation errors.

## 2024-01-20 ENCOUNTER — Encounter: Payer: Self-pay | Admitting: Family

## 2024-01-24 ENCOUNTER — Other Ambulatory Visit: Payer: Self-pay

## 2024-01-24 MED ORDER — AMPHETAMINE-DEXTROAMPHET ER 20 MG PO CP24: 20.0000 mg | ORAL_CAPSULE | Freq: Every day | ORAL | 0 refills | Status: DC

## 2024-02-06 ENCOUNTER — Ambulatory Visit (INDEPENDENT_AMBULATORY_CARE_PROVIDER_SITE_OTHER): Admitting: Family

## 2024-02-06 ENCOUNTER — Encounter: Payer: Self-pay | Admitting: Family

## 2024-02-06 VITALS — BP 120/94 | HR 99 | Ht 61.0 in | Wt 127.0 lb

## 2024-02-06 DIAGNOSIS — F32A Depression, unspecified: Secondary | ICD-10-CM | POA: Diagnosis not present

## 2024-02-06 DIAGNOSIS — F900 Attention-deficit hyperactivity disorder, predominantly inattentive type: Secondary | ICD-10-CM

## 2024-02-06 DIAGNOSIS — Z79899 Other long term (current) drug therapy: Secondary | ICD-10-CM | POA: Diagnosis not present

## 2024-02-06 DIAGNOSIS — F419 Anxiety disorder, unspecified: Secondary | ICD-10-CM

## 2024-02-06 NOTE — Assessment & Plan Note (Signed)
 Patient stable.  Well controlled with current therapy.   Continue current meds.

## 2024-02-06 NOTE — Progress Notes (Signed)
 Established Patient Office Visit  Subjective:  Patient ID: Terri Andrews, female    DOB: 10/05/1980  Age: 43 y.o. MRN: [id number]  Chief Complaint  Patient presents with   Follow-up    Follow up    Patient is here today for ADHD medication follow up.   She has been doing Well with current dosing of medications.  Sleeping Well.  She is not having anxiety. Patient denies physical symptoms.   Last PDMP check:  I have reviewed the PDMP during this encounter. UDS is due today.   Labs are not due today.   Patient does not have additional concerns today.     No other concerns at this time.   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 Conceived on clomid s/p uterine septum removed   History of postpartum depression 10/25/2017   IBS (irritable bowel syndrome)    constipation   Kidney stones    Melasma    lip   Psoriasis    on Humira Dr. Arlyss    Snoring 10/08/2021   UTI (urinary tract infection)     Past Surgical History:  Procedure Laterality Date   CERVICAL BIOPSY     KNEE SURGERY     cyst removal   UTERINE SEPTUM RESECTION  04/2012   Dr. 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    Social Drivers of Health   Financial Resource Strain: Not on file  Food Insecurity: Not on file  Transportation Needs: Not on file  Physical Activity: Not on file  Stress: Not on file  Social Connections: Not on file  Intimate Partner Violence: Not on file    Family History  Problem Relation Age of Onset   Rashes / Skin problems Mother    Other Mother        abnormal mammogram but not cancer   Hyperlipidemia Father    Rashes / Skin problems Father    Rashes / Skin problems Sister    Breast cancer Paternal Aunt     No Known Allergies  Review of Systems  All other systems reviewed and are negative.      Objective:   BP (!) 120/94   Pulse 99   Ht 5' 1 (1.549 m)   Wt 127 lb (57.6 kg)   SpO2 98%   BMI 24.00 kg/m   Vitals:   02/06/24 1537  BP: (!) 120/94  Pulse: 99  Height: 5' 1 (1.549 m)  Weight: 127 lb (57.6 kg)  SpO2: 98%  BMI (Calculated): 24.01    Physical Exam Vitals and nursing note reviewed.  Constitutional:      Appearance: Normal appearance. She is normal weight.  HENT:     Head: Normocephalic.  Eyes:     Extraocular Movements: Extraocular movements intact.     Conjunctiva/sclera: Conjunctivae normal.     Pupils: Pupils are equal, round, and reactive to light.  Cardiovascular:     Rate and Rhythm: Normal rate.  Pulmonary:     Effort: Pulmonary effort is normal.  Neurological:     General: No focal deficit present.     Mental Status: She is alert and oriented to person, place, and time. Mental status is at baseline.  Psychiatric:        Mood and Affect: Mood normal.        Behavior: Behavior normal.        Thought Content: Thought content normal.      No results found for any visits on 02/06/24.  Recent Results (from the past 2160 hours)  NuSwab VG+, HSV     Status: None   Collection Time: 11/16/23  3:54 PM  Result Value Ref Range   Atopobium vaginae Low -  0 Score   BVAB 2 Low - 0 Score   Megasphaera 1 Low - 0 Score    Comment: Calculate total score by adding the 3 individual bacterial vaginosis (BV) marker scores together.  Total score is interpreted as follows: Total score 0-1: Indicates the absence of BV. Total score   2: Indeterminate for BV. Additional clinical                  data should be evaluated to establish a                  diagnosis. Total score 3-6: Indicates the presence of BV.    Candida albicans, NAA Negative Negative   Candida glabrata, NAA Negative Negative   Trich vag by NAA Negative Negative   Chlamydia trachomatis, NAA Negative Negative   Neisseria gonorrhoeae, NAA Negative Negative   HSV 1 NAA Negative Negative   HSV 2 NAA Negative Negative  RPR w/reflex to TrepSure     Status: None   Collection Time: 11/17/23  8:01 AM  Result Value Ref Range   RPR Non Reactive Non Reactive  HIV antibody (with reflex)     Status: None   Collection Time: 11/17/23  8:01 AM  Result Value Ref Range   HIV Screen 4th Generation wRfx Non Reactive Non Reactive    Comment: HIV-1/HIV-2 antibodies and HIV-1 p24 antigen were NOT detected. There is no laboratory evidence of HIV infection. HIV Negative   Acute Viral Hepatitis (HAV, HBV, HCV)     Status: None   Collection Time: 11/17/23  8:01 AM  Result Value Ref Range   Hep A IgM Negative Negative    Comment: A negative anti-HAV IgM result suggests no recent or current HAV infection.    Hepatitis B Surface Ag Negative Negative   Hep B C IgM Negative Negative   HCV Ab Non Reactive Non Reactive  Interpretation:     Status: None   Collection Time: 11/17/23  8:01 AM  Result Value Ref Range   HCV Interp 1: Comment     Comment: Not infected with HCV unless early or acute infection is suspected (which may be delayed in an immunocompromised individual), or  other evidence exists to indicate HCV infection.   Treponemal Antibodies, TPPA     Status: None   Collection Time: 11/17/23   8:01 AM  Result Value Ref Range   Treponemal Antibodies, TPPA Non Reactive Non Reactive    Comment: This test is intended ONLY for specimens that have tested positive (reactive) or equivocal for Treponema pallidum antibodies prior to submission for testing. For the full CDC-recommended syphilis screening and diagnosis algorithm, [facility] offers test code 012005 RPR, Rfx Qn RPR/Confirm TP or 917654 T pallidum Screening Cascade.        Assessment & Plan ADHD (attention deficit hyperactivity disorder), inattentive type Long term current use of therapeutic drug UDS obtained in office today.  Will call with results.   Patient stable.  Well controlled with current therapy.   Continue current meds.   Anxiety and depression Patient stable.  Well controlled with current therapy.   Continue current meds.      Return in about 3 months (around 05/07/2024).   Total time spent: 20 minutes  ALAN CHRISTELLA ARRANT, FNP  02/06/2024   This document may have been prepared by [facility] Voice Recognition software and as such may include unintentional dictation errors.

## 2024-02-06 NOTE — Assessment & Plan Note (Signed)
 UDS obtained in office today.  Will call with results.   Patient stable.  Well controlled with current therapy.   Continue current meds.

## 2024-02-07 ENCOUNTER — Other Ambulatory Visit: Payer: Self-pay | Admitting: Medical Genetics

## 2024-02-09 LAB — TOXASSURE SELECT 13 (MW), URINE

## 2024-02-24 ENCOUNTER — Other Ambulatory Visit: Payer: Self-pay

## 2024-02-24 ENCOUNTER — Encounter: Payer: Self-pay | Admitting: Family

## 2024-02-24 DIAGNOSIS — F419 Anxiety disorder, unspecified: Secondary | ICD-10-CM

## 2024-02-24 DIAGNOSIS — F32A Depression, unspecified: Secondary | ICD-10-CM

## 2024-02-27 ENCOUNTER — Telehealth: Admitting: Physician Assistant

## 2024-02-27 DIAGNOSIS — R3989 Other symptoms and signs involving the genitourinary system: Secondary | ICD-10-CM

## 2024-02-27 DIAGNOSIS — R300 Dysuria: Secondary | ICD-10-CM

## 2024-02-27 MED ORDER — PHENAZOPYRIDINE HCL 100 MG PO TABS: 100.0000 mg | ORAL_TABLET | Freq: Three times a day (TID) | ORAL | 0 refills | Status: AC

## 2024-02-27 MED ORDER — NITROFURANTOIN MONOHYD MACRO 100 MG PO CAPS: 100.0000 mg | ORAL_CAPSULE | Freq: Two times a day (BID) | ORAL | 0 refills | Status: AC

## 2024-02-27 NOTE — Progress Notes (Signed)

## 2024-03-01 MED ORDER — AMPHETAMINE-DEXTROAMPHET ER 20 MG PO CP24: 20.0000 mg | ORAL_CAPSULE | Freq: Every day | ORAL | 0 refills | Status: DC

## 2024-03-01 MED ORDER — VENLAFAXINE HCL ER 75 MG PO CP24: ORAL_CAPSULE | ORAL | 3 refills | Status: AC

## 2024-03-02 ENCOUNTER — Other Ambulatory Visit: Payer: Self-pay

## 2024-03-02 MED ORDER — AMPHETAMINE-DEXTROAMPHET ER 20 MG PO CP24: 20.0000 mg | ORAL_CAPSULE | Freq: Every day | ORAL | 0 refills | Status: DC

## 2024-03-26 ENCOUNTER — Telehealth: Admitting: Physician Assistant

## 2024-03-26 ENCOUNTER — Encounter: Payer: Self-pay | Admitting: Family

## 2024-03-26 DIAGNOSIS — R300 Dysuria: Secondary | ICD-10-CM

## 2024-03-26 NOTE — Progress Notes (Signed)
  Because you were recently treated for UTI and are not getting better, I feel your condition warrants further evaluation and I recommend that you be seen in a face-to-face visit. You will likely need urine testing.    NOTE: There will be NO CHARGE for this E-Visit   If you are having a true medical emergency, please call 911.     For an urgent face to face visit, [place] has multiple urgent care centers for your convenience.  Click the link below for the full list of locations and hours, walk-in wait times, appointment scheduling options and driving directions:  Urgent Care - [place], [place], [place], [place], [place], [place]  [place]     Your MyChart E-visit questionnaire answers were reviewed by a board certified advanced clinical practitioner to complete your personal care plan based on your specific symptoms.    Thank you for using e-Visits.

## 2024-03-27 ENCOUNTER — Other Ambulatory Visit

## 2024-03-28 ENCOUNTER — Other Ambulatory Visit: Payer: Self-pay

## 2024-03-28 ENCOUNTER — Ambulatory Visit (INDEPENDENT_AMBULATORY_CARE_PROVIDER_SITE_OTHER): Admitting: Family

## 2024-03-28 DIAGNOSIS — R350 Frequency of micturition: Secondary | ICD-10-CM | POA: Diagnosis not present

## 2024-03-28 LAB — POCT URINALYSIS DIPSTICK
Bilirubin, UA: NEGATIVE
Blood, UA: POSITIVE
Glucose, UA: NEGATIVE
Ketones, UA: NEGATIVE
Nitrite, UA: NEGATIVE
Protein, UA: POSITIVE — AB
Spec Grav, UA: 1.01 (ref 1.010–1.025)
Urobilinogen, UA: 0.2 U/dL
pH, UA: 7.5 (ref 5.0–8.0)

## 2024-03-29 ENCOUNTER — Ambulatory Visit: Payer: Self-pay

## 2024-03-29 ENCOUNTER — Encounter: Payer: Self-pay | Admitting: Family

## 2024-03-29 ENCOUNTER — Other Ambulatory Visit: Payer: Self-pay

## 2024-03-29 DIAGNOSIS — R350 Frequency of micturition: Secondary | ICD-10-CM

## 2024-03-29 MED ORDER — NITROFURANTOIN MONOHYD MACRO 100 MG PO CAPS: 100.0000 mg | ORAL_CAPSULE | Freq: Two times a day (BID) | ORAL | 0 refills | Status: DC

## 2024-03-31 NOTE — Progress Notes (Signed)
   CHIEF COMPLAINT  UA/ only visit fot UTI     REASON FOR VISIT  Possible UTI, UA Visit Only      ASSESSMENT & PLAN Diagnoses and all orders for this visit:  Frequent urination -     POCT Urinalysis Dipstick (18997)     Patient notified.  Total time spent: 5 minutes  ALAN CHRISTELLA ARRANT, FNP 03/28/2024

## 2024-04-02 LAB — URINE CULTURE

## 2024-04-03 ENCOUNTER — Ambulatory Visit: Payer: Self-pay

## 2024-04-03 ENCOUNTER — Ambulatory Visit: Admitting: Family

## 2024-05-07 ENCOUNTER — Ambulatory Visit: Admitting: Internal Medicine

## 2024-05-07 ENCOUNTER — Encounter: Payer: Self-pay | Admitting: Internal Medicine

## 2024-05-07 ENCOUNTER — Ambulatory Visit: Payer: Self-pay | Admitting: Internal Medicine

## 2024-05-07 VITALS — BP 124/88 | HR 98 | Ht 61.0 in | Wt 138.8 lb

## 2024-05-07 DIAGNOSIS — F419 Anxiety disorder, unspecified: Secondary | ICD-10-CM

## 2024-05-07 DIAGNOSIS — M545 Low back pain, unspecified: Secondary | ICD-10-CM

## 2024-05-07 DIAGNOSIS — R7303 Prediabetes: Secondary | ICD-10-CM

## 2024-05-07 DIAGNOSIS — E538 Deficiency of other specified B group vitamins: Secondary | ICD-10-CM

## 2024-05-07 DIAGNOSIS — O99891 Other specified diseases and conditions complicating pregnancy: Secondary | ICD-10-CM

## 2024-05-07 DIAGNOSIS — E559 Vitamin D deficiency, unspecified: Secondary | ICD-10-CM

## 2024-05-07 DIAGNOSIS — N3001 Acute cystitis with hematuria: Secondary | ICD-10-CM

## 2024-05-07 DIAGNOSIS — F900 Attention-deficit hyperactivity disorder, predominantly inattentive type: Secondary | ICD-10-CM

## 2024-05-07 DIAGNOSIS — R1011 Right upper quadrant pain: Secondary | ICD-10-CM

## 2024-05-07 LAB — POCT URINALYSIS DIPSTICK
Bilirubin, UA: NEGATIVE
Blood, UA: POSITIVE
Glucose, UA: NEGATIVE
Ketones, UA: NEGATIVE
Nitrite, UA: NEGATIVE
Protein, UA: NEGATIVE
Spec Grav, UA: 1.02 (ref 1.010–1.025)
Urobilinogen, UA: 1 U/dL
pH, UA: 7 (ref 5.0–8.0)

## 2024-05-07 MED ORDER — AMOXICILLIN-POT CLAVULANATE 875-125 MG PO TABS: 1.0000 | ORAL_TABLET | Freq: Two times a day (BID) | ORAL | 0 refills | Status: AC

## 2024-05-07 MED ORDER — AMPHETAMINE-DEXTROAMPHET ER 20 MG PO CP24: 20.0000 mg | ORAL_CAPSULE | Freq: Every day | ORAL | 0 refills | Status: DC

## 2024-05-07 NOTE — Progress Notes (Signed)
 Established Patient Office Visit  Subjective:  Patient ID: Terri Andrews, female    DOB: November 19, 1980  Age: 43 y.o. MRN: [id number]  Chief Complaint  Patient presents with   Follow-up    3 month follow up. Lower back pain that comes and goes started Friday night woke her up in the middle of the night.     Patient comes in with 3 day history of right flank pain. Noticed dysuria and some blood in urine, but she was also towards the end of here menstrual cycle. Patient does have a remote history of Kidney stones. Recently had several episodes of UTI .  Today denies nausea or vomiting, no fever or chills. Urine dipstick does show Pus cells, and Blood. Will send for c/s- empirically start Augmentin  875 mg , 1po bid-  Advised to drink extra water, empty bladder before and after sexual intercourse. Will order CT Renal stone protocol. Patient 's last PAP  was in 2021, was HPV Positive with LGSIL, done by her previous PMD. She recalls that she was sent to ob/gyn and had a biopsy which was negtaive. However will schedule a CPE in 10 days, with PAP, Breast exam and to schedule baseline mammo. Fasting labs ordered.    No other concerns at this time.   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 Conceived on clomid s/p uterine septum removed   History of postpartum depression 10/25/2017   IBS (irritable bowel syndrome)    constipation   Kidney stones    Melasma    lip   Psoriasis    on Humira Dr. Arlyss    Snoring 10/08/2021   UTI (urinary tract infection)     Past Surgical History:  Procedure Laterality Date   CERVICAL BIOPSY     KNEE SURGERY     cyst removal   UTERINE  SEPTUM RESECTION  04/2012   Dr. Gretel at [facility]    Social History   Socioeconomic History   Marital status: Married    Spouse name: Not on file   Number of children: Not on file   Years of education: Not on file   Highest education level: Not on file  Occupational History   Not on file  Tobacco Use   Smoking status: Never   Smokeless tobacco: Never  Substance and Sexual Activity   Alcohol use: No    Alcohol/week: 0.0 standard drinks of alcohol   Drug use: No   Sexual activity: Yes    Partners: Male  Other Topics Concern   Not on file  Social History Narrative   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    Social Drivers of Health   Tobacco Use: Low Risk (05/07/2024)   Patient History    Smoking Tobacco Use: Never    Smokeless Tobacco Use: Never    Passive Exposure: Not on file  Financial Resource Strain: Not on file  Food Insecurity: Not on file  Transportation Needs: Not on file  Physical Activity: Not on file  Stress: Not on file  Social Connections: Not on file  Intimate Partner Violence: Not on file  Depression (PHQ2-9): Medium Risk (08/22/2023)   Depression (PHQ2-9)    PHQ-2 Score: 8  Alcohol Screen: Not on file  Housing: Not on file  Utilities: Not on file  Health Literacy: Not on file    Family History  Problem Relation Age of Onset   Rashes / Skin problems Mother    Other Mother        abnormal mammogram but not cancer   Hyperlipidemia Father    Rashes / Skin problems Father    Rashes / Skin problems Sister    Breast cancer Paternal Aunt     Allergies[1]  Show/hide medication list[2]  Review of Systems  Constitutional: Negative.  Negative for chills, fever and malaise/fatigue.  HENT: Negative.  Negative for congestion and sore throat.   Eyes: Negative.  Negative for blurred vision and pain.  Respiratory: Negative.  Negative for cough and shortness of breath.   Cardiovascular: Negative.  Negative for chest pain, palpitations and leg swelling.   Gastrointestinal: Negative.  Negative for abdominal pain, blood in stool, constipation, diarrhea, heartburn, melena, nausea and vomiting.  Genitourinary:  Positive for dysuria, flank pain and hematuria. Negative for frequency and urgency.  Musculoskeletal:  Negative for joint pain and myalgias.  Skin: Negative.   Neurological: Negative.  Negative for dizziness, tingling, sensory change, weakness and headaches.  Endo/Heme/Allergies: Negative.   Psychiatric/Behavioral: Negative.  Negative for depression and suicidal ideas. The patient is not nervous/anxious.        Objective:   BP 124/88   Pulse 98   Ht 5' 1 (1.549 m)   Wt 138 lb 12.8 oz (63 kg)   SpO2 98%   BMI 26.23 kg/m   Vitals:   05/07/24 1526  BP: 124/88  Pulse: 98  Height: 5' 1 (1.549 m)  Weight: 138 lb 12.8 oz (63 kg)  SpO2: 98%  BMI (Calculated): 26.24    Physical Exam Vitals and nursing note reviewed.  Constitutional:      Appearance: Normal appearance.  HENT:     Head: Normocephalic and atraumatic.     Nose: Nose normal.     Mouth/Throat:     Mouth: Mucous membranes are moist.     Pharynx: Oropharynx is clear.  Eyes:     Conjunctiva/sclera: Conjunctivae normal.     Pupils: Pupils are equal, round, and reactive to light.  Cardiovascular:     Rate and Rhythm: Normal rate and regular rhythm.     Pulses: Normal pulses.     Heart sounds: Normal heart sounds. No murmur heard. Pulmonary:     Effort: Pulmonary effort is normal.     Breath sounds: Normal breath sounds. No wheezing.  Abdominal:     General: Bowel sounds are normal.     Palpations: Abdomen is soft.     Tenderness: There is right CVA tenderness. There is no left CVA tenderness.  Musculoskeletal:        General: Normal range of motion.     Cervical back: Normal range of motion.     Right lower leg: No edema.     Left lower leg: No edema.  Skin:    General: Skin is warm and dry.  Neurological:     General: No focal deficit present.      Mental Status: She is alert and oriented to person, place, and time.  Psychiatric:        Mood and Affect: Mood normal.        Behavior: Behavior normal.  Results for orders placed or performed in visit on 05/07/24  POCT urinalysis dipstick  Result Value Ref Range   Color, UA     Clarity, UA     Glucose, UA Negative Negative   Bilirubin, UA Negative    Ketones, UA Negative    Spec Grav, UA 1.020 1.010 - 1.025   Blood, UA Positive    pH, UA 7.0 5.0 - 8.0   Protein, UA Negative Negative   Urobilinogen, UA 1.0 0.2 or 1.0 E.U./dL   Nitrite, UA Negative    Leukocytes, UA Small (1+) (A) Negative   Appearance     Odor      Recent Results (from the past 2160 hours)  POCT Urinalysis Dipstick (18997)     Status: Abnormal   Collection Time: 03/28/24  4:36 PM  Result Value Ref Range   Color, UA     Clarity, UA     Glucose, UA Negative Negative   Bilirubin, UA Negative    Ketones, UA Negative    Spec Grav, UA 1.010 1.010 - 1.025   Blood, UA Positive     Comment: 80Ery/uL, 2+   pH, UA 7.5 5.0 - 8.0   Protein, UA Positive (A) Negative    Comment: 15mg /dL   Urobilinogen, UA 0.2 0.2 or 1.0 E.U./dL   Nitrite, UA Negative    Leukocytes, UA Moderate (2+) (A) Negative   Appearance Very Cloudy    Odor    Urine Culture     Status: Abnormal   Collection Time: 03/29/24  8:16 AM   Specimen: Urine   UR  Result Value Ref Range   Urine Culture, Routine Final report (A)    Organism ID, Bacteria Comment (A)     Comment: Staphylococcus saprophyticus The CLSI does not advise routine susceptibility testing of urinary tract isolates of Staphylococcus saprophyticus, because acute, uncomplicated urinary tract infections caused by this organism respond to concentrations achieved in urine of antimicrobial agents commonly used to treat these infections, such as nitrofurantoin , a fluoroquinolone, or trimethoprim  with or without sulfamethoxazole . CLSI, M100-S15, 2005. Greater than 100,000  colony forming units per mL   POCT urinalysis dipstick     Status: Abnormal   Collection Time: 05/07/24  3:42 PM  Result Value Ref Range   Color, UA     Clarity, UA     Glucose, UA Negative Negative   Bilirubin, UA Negative    Ketones, UA Negative    Spec Grav, UA 1.020 1.010 - 1.025   Blood, UA Positive    pH, UA 7.0 5.0 - 8.0   Protein, UA Negative Negative   Urobilinogen, UA 1.0 0.2 or 1.0 E.U./dL   Nitrite, UA Negative    Leukocytes, UA Small (1+) (A) Negative   Appearance     Odor        Assessment & Plan:  Urine for c/s. Augmentin  for now- Sch CPE. Problem List Items Addressed This Visit       Other   Anxiety and depression   ADHD (attention deficit hyperactivity disorder), inattentive type   Relevant Medications   amphetamine -dextroamphetamine (ADDERALL XR) 20 MG 24 hr capsule   Other Visit Diagnoses       Acute cystitis with hematuria    -  Primary   Relevant Medications   amoxicillin -clavulanate (AUGMENTIN ) 875-125 MG tablet     Acute low back pain, unspecified back pain laterality, unspecified whether sciatica present       Relevant Orders   POCT urinalysis dipstick (  Completed)       Return in about 10 days (around 05/17/2024).   Total time spent: 30 minutes. This time includes review of previous notes and results and patient face to face interaction during today's visit.    FERNAND FREDY RAMAN, MD  05/07/2024   This document may have been prepared by [facility] Voice Recognition software and as such may include unintentional dictation errors.     [1] No Known Allergies [2]  Outpatient Medications Prior to Visit  Medication Sig   clonazePAM  (KLONOPIN ) 0.5 MG tablet Take 1 tablet (0.5 mg total) by mouth daily as needed for anxiety.   SKYRIZI, 150 MG DOSE, 75 MG/0.83ML PSKT 2 doses 0 month, 1 month then every 3 months  Dr. Isenstein   venlafaxine  XR (EFFEXOR -XR) 75 MG 24 hr capsule TAKE 1 CAPSULE(75 MG) BY MOUTH DAILY WITH BREAKFAST   [DISCONTINUED]  amphetamine -dextroamphetamine (ADDERALL XR) 20 MG 24 hr capsule Take 1 capsule (20 mg total) by mouth daily.   [DISCONTINUED] busPIRone  (BUSPAR ) 5 MG tablet Take 1 tablet (5 mg total) by mouth 2 (two) times daily as needed (anxiety). (Patient not taking: Reported on 05/07/2024)   [DISCONTINUED] nitrofurantoin , macrocrystal-monohydrate, (MACROBID ) 100 MG capsule Take 1 capsule (100 mg total) by mouth 2 (two) times daily. (Patient not taking: Reported on 05/07/2024)   No facility-administered medications prior to visit.

## 2024-05-09 LAB — URINE CULTURE

## 2024-05-11 ENCOUNTER — Other Ambulatory Visit: Payer: Self-pay | Admitting: Internal Medicine

## 2024-05-11 DIAGNOSIS — N3001 Acute cystitis with hematuria: Secondary | ICD-10-CM

## 2024-05-11 MED ORDER — CIPROFLOXACIN HCL 500 MG PO TABS: 500.0000 mg | ORAL_TABLET | Freq: Two times a day (BID) | ORAL | 0 refills | Status: AC

## 2024-05-15 NOTE — Progress Notes (Signed)
 Pt.notified

## 2024-06-07 ENCOUNTER — Encounter: Payer: Self-pay | Admitting: Family

## 2024-06-08 ENCOUNTER — Encounter: Admitting: Family

## 2024-06-12 ENCOUNTER — Other Ambulatory Visit: Payer: Self-pay | Admitting: Medical Genetics

## 2024-06-12 DIAGNOSIS — Z006 Encounter for examination for normal comparison and control in clinical research program: Secondary | ICD-10-CM

## 2024-06-13 ENCOUNTER — Encounter

## 2024-06-15 ENCOUNTER — Encounter: Payer: Self-pay | Admitting: Family

## 2024-06-15 ENCOUNTER — Other Ambulatory Visit: Payer: Self-pay

## 2024-06-15 DIAGNOSIS — F900 Attention-deficit hyperactivity disorder, predominantly inattentive type: Secondary | ICD-10-CM

## 2024-06-15 MED ORDER — AMPHETAMINE-DEXTROAMPHET ER 20 MG PO CP24: 20.0000 mg | ORAL_CAPSULE | Freq: Every day | ORAL | 0 refills | Status: AC

## 2024-06-18 ENCOUNTER — Encounter: Admitting: Family

## 2024-07-10 ENCOUNTER — Encounter: Admitting: Family

## 2024-07-30 ENCOUNTER — Encounter: Admitting: Family
